# Patient Record
Sex: Female | Born: 1970 | Marital: Single
Health system: Southern US, Community
[De-identification: ages and names within clinical notes are randomized; demographics above are authoritative.]

## PROBLEM LIST (undated history)

## (undated) DIAGNOSIS — F32A Depression, unspecified: Secondary | ICD-10-CM

## (undated) DIAGNOSIS — F419 Anxiety disorder, unspecified: Secondary | ICD-10-CM

## (undated) DIAGNOSIS — I10 Essential (primary) hypertension: Secondary | ICD-10-CM

## (undated) DIAGNOSIS — R000 Tachycardia, unspecified: Secondary | ICD-10-CM

## (undated) DIAGNOSIS — F0390 Unspecified dementia without behavioral disturbance: Secondary | ICD-10-CM

## (undated) DIAGNOSIS — F329 Major depressive disorder, single episode, unspecified: Secondary | ICD-10-CM

## (undated) DIAGNOSIS — F1020 Alcohol dependence, uncomplicated: Secondary | ICD-10-CM

---

## 2017-11-04 ENCOUNTER — Other Ambulatory Visit: Payer: Self-pay

## 2017-11-04 ENCOUNTER — Encounter: Payer: Self-pay | Admitting: Emergency Medicine

## 2017-11-04 ENCOUNTER — Emergency Department
Admission: EM | Admit: 2017-11-04 | Discharge: 2017-11-04 | Disposition: A | Discharge status: Home / Self Care | Payer: Medicaid Other | Attending: Emergency Medicine | Admitting: Emergency Medicine

## 2017-11-04 DIAGNOSIS — F329 Major depressive disorder, single episode, unspecified: Secondary | ICD-10-CM | POA: Diagnosis not present

## 2017-11-04 DIAGNOSIS — F419 Anxiety disorder, unspecified: Secondary | ICD-10-CM | POA: Diagnosis not present

## 2017-11-04 DIAGNOSIS — F1027 Alcohol dependence with alcohol-induced persisting dementia: Secondary | ICD-10-CM

## 2017-11-04 DIAGNOSIS — R4689 Other symptoms and signs involving appearance and behavior: Secondary | ICD-10-CM

## 2017-11-04 DIAGNOSIS — R456 Violent behavior: Secondary | ICD-10-CM | POA: Diagnosis present

## 2017-11-04 DIAGNOSIS — R451 Restlessness and agitation: Secondary | ICD-10-CM

## 2017-11-04 DIAGNOSIS — I10 Essential (primary) hypertension: Secondary | ICD-10-CM | POA: Insufficient documentation

## 2017-11-04 HISTORY — DX: Essential (primary) hypertension: I10

## 2017-11-04 HISTORY — DX: Anxiety disorder, unspecified: F41.9

## 2017-11-04 HISTORY — DX: Alcohol dependence, uncomplicated: F10.20

## 2017-11-04 HISTORY — DX: Tachycardia, unspecified: R00.0

## 2017-11-04 LAB — COMPREHENSIVE METABOLIC PANEL
ALK PHOS: 65 U/L (ref 38–126)
ALT: 14 U/L (ref 0–44)
AST: 18 U/L (ref 15–41)
Albumin: 4.4 g/dL (ref 3.5–5.0)
Anion gap: 7 (ref 5–15)
BILIRUBIN TOTAL: 0.5 mg/dL (ref 0.3–1.2)
BUN: 8 mg/dL (ref 6–20)
CHLORIDE: 110 mmol/L (ref 98–111)
CO2: 22 mmol/L (ref 22–32)
CREATININE: 0.9 mg/dL (ref 0.44–1.00)
Calcium: 9.1 mg/dL (ref 8.9–10.3)
GFR calc Af Amer: >60 mL/min (ref >60)
Glucose, Bld: 98 mg/dL (ref 70–99)
Potassium: 3.8 mmol/L (ref 3.5–5.1)
Sodium: 139 mmol/L (ref 135–145)
TOTAL PROTEIN: 7.1 g/dL (ref 6.5–8.1)

## 2017-11-04 LAB — URINE DRUG SCREEN, QUALITATIVE (ARMC ONLY)
Amphetamines, Ur Screen: NOT DETECTED
BARBITURATES, UR SCREEN: NOT DETECTED
BENZODIAZEPINE, UR SCRN: NOT DETECTED
Cannabinoid 50 Ng, Ur ~~LOC~~: NOT DETECTED
Cocaine Metabolite,Ur ~~LOC~~: NOT DETECTED
MDMA (Ecstasy)Ur Screen: NOT DETECTED
METHADONE SCREEN, URINE: NOT DETECTED
OPIATE, UR SCREEN: NOT DETECTED
Phencyclidine (PCP) Ur S: NOT DETECTED
Tricyclic, Ur Screen: NOT DETECTED

## 2017-11-04 LAB — ETHANOL

## 2017-11-04 LAB — CBC
HEMATOCRIT: 38.2 % (ref 35.0–47.0)
Hemoglobin: 13.1 g/dL (ref 12.0–16.0)
MCH: 29 pg (ref 26.0–34.0)
MCHC: 34.3 g/dL (ref 32.0–36.0)
MCV: 84.5 fL (ref 80.0–100.0)
Platelets: 319 10*3/uL (ref 150–440)
RBC: 4.52 MIL/uL (ref 3.80–5.20)
RDW: 12.8 % (ref 11.5–14.5)
WBC: 5.2 10*3/uL (ref 3.6–11.0)

## 2017-11-04 MED ORDER — QUETIAPINE FUMARATE 25 MG PO TABS
100.0000 mg | ORAL_TABLET | Freq: Three times a day (TID) | ORAL | Status: DC
  Filled 2017-11-04: qty 4

## 2017-11-04 NOTE — Discharge Instructions (Addendum)
You have been seen in the Emergency Department (ED)  today for a psychiatric complaint.  You have been evaluated by psychiatry and we believe you are safe to be discharged from the hospital.   ° °Please return to the Emergency Department (ED)  immediately if you have ANY thoughts of hurting yourself or anyone else, so that we may help you. ° °Please avoid alcohol and drug use. ° °Follow up with your doctor and/or therapist as soon as possible regarding today's ED  visit.  ° °You may call crisis hotline for [place] at [phone]. ° °

## 2017-11-04 NOTE — ED Provider Notes (Signed)
[facility] Emergency Department Provider Note  ____________________________________________  Time seen: Approximately 3:16 PM  I have reviewed the triage vital signs and the nursing notes.   HISTORY  Chief Complaint Psychiatric Evaluation  Level 5 caveat:  Portions of the history and physical were unable to be obtained due to dementia   HPI Joanne Johnston is a 47 y.o. female history of Wernicke Korsakoff encephalopathy and bipolar disorder who presents for evaluation for aggressive behavior. Patient was hospitalized at [facility] for 4 months and finally placed at a memory unit locally.  Today patient had an argument at the facility and was sent here for evaluation.  Patient is tearful and very confused.  She reports that she has been having memory issues for several months.  She does not really remember what her diagnosis are.  She denies SI or HI.   Past Medical History:  Diagnosis Date  . Alcohol dependence (HCC)   . Anxiety   . Hypertension   . Tachycardia     Patient Active Problem List   Diagnosis Date Noted  . 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 Agitation 11/04/2017    Past Surgical History:  Procedure Laterality Date  . CESAREAN SECTION     x2    Prior to Admission medications   Not on File    Allergies Patient has no known allergies.   Social History Social History   Tobacco Use  . Smoking status: Never Smoker  . Smokeless tobacco: Never Used  Substance Use Topics  . Alcohol use: Not Currently    Frequency: Never    Comment: states hasn't had a drink in months  . Drug use: Never    Review of Systems  Constitutional: Negative for fever. Eyes: Negative for visual changes. ENT: Negative for sore throat. Neck: No neck pain  Cardiovascular: Negative for chest pain. Respiratory: Negative for shortness of breath. Gastrointestinal: Negative for abdominal pain, vomiting or diarrhea. Genitourinary:  Negative for dysuria. Musculoskeletal: Negative for back pain. Skin: Negative for rash. Neurological: Negative for headaches, weakness or numbness. Psych: No SI or HI. + confusion  ____________________________________________   PHYSICAL EXAM:  VITAL SIGNS: ED Triage Vitals  Enc Vitals Group     BP 11/04/17 1338 (!) 140/96     Pulse Rate 11/04/17 1338 85     Resp 11/04/17 1338 20     Temp 11/04/17 1338 98.8 F (37.1 C)     Temp Source 11/04/17 1338 Oral     SpO2 11/04/17 1338 100 %     Weight 11/04/17 1338 165 lb (74.8 kg)     Height 11/04/17 1338 5\' 8"  (1.727 m)     Head Circumference --      Peak Flow --      Pain Score 11/04/17 1346 0     Pain Loc --      Pain Edu? --      Excl. in GC? --     Constitutional: Alert and oriented. Well appearing and in no apparent distress. HEENT:      Head: Normocephalic and atraumatic.         Eyes: Conjunctivae are normal. Sclera is non-icteric.       Mouth/Throat: Mucous membranes are moist.       Neck: Supple with no signs of meningismus. Cardiovascular: Regular rate and rhythm. No murmurs, gallops, or rubs. 2+ symmetrical distal pulses are present in all extremities. No JVD. Respiratory: Normal respiratory effort. Lungs are clear to auscultation  bilaterally. No wheezes, crackles, or rhonchi.  Gastrointestinal: Soft, non tender, and non distended with positive bowel sounds. No rebound or guarding. Genitourinary: No CVA tenderness. Musculoskeletal: Nontender with normal range of motion in all extremities. No edema, cyanosis, or erythema of extremities. Neurologic: Normal speech and language. Face is symmetric. Moving all extremities. No gross focal neurologic deficits are appreciated. Skin: Skin is warm, dry and intact. No rash noted. Psychiatric: Mood and affect are depressed. Speech and behavior are normal.  ____________________________________________   LABS (all labs ordered are listed, but only abnormal results are  displayed)  Labs Reviewed  COMPREHENSIVE METABOLIC PANEL  ETHANOL  CBC  URINE DRUG SCREEN, QUALITATIVE ([facility] ONLY)   ____________________________________________  EKG  none  ____________________________________________  RADIOLOGY  none  ____________________________________________   PROCEDURES  Procedure(s) performed: None Procedures Critical Care performed:  None ____________________________________________   INITIAL IMPRESSION / ASSESSMENT AND PLAN / ED COURSE  47 y.o. female history of Wernicke Korsakoff encephalopathy and bipolar disorder who presents for evaluation for aggressive behavior.  Patient diagnosed 4 months ago with advanced dementia due to alcohol, currently living in a memory unit.  She is brought in today for aggressive behavior.  Patient is polite and tearful, denies any suicidal homicidal ideation.  At this time no indication for involuntary commitment.  Will discuss with our psychiatrist for evaluation in the emergency room.    _________________________ 5:50 PM on 11/04/2017 -----------------------------------------  Patient cleared by psychiatry to be discharged back to her memory care home.   As part of my medical decision making, I reviewed the following data within the electronic MEDICAL RECORD NUMBER Nursing notes reviewed and incorporated, Labs reviewed , Old chart reviewed, A consult was requested and obtained from this/these consultant(s) Psychiatry, Notes from prior ED visits and [place] Controlled Substance Database    Pertinent labs & imaging results that were available during my care of the patient were reviewed by me and considered in my medical decision making (see chart for details).    ____________________________________________   FINAL CLINICAL IMPRESSION(S) / ED DIAGNOSES  Final diagnoses:  Aggressive behavior      NEW MEDICATIONS STARTED DURING THIS VISIT:  ED Discharge Orders    None       Note:  This document was  prepared using Dragon voice recognition software and may include unintentional dictation errors.    Don Perking, [place], MD 11/04/17 [phone]

## 2017-11-04 NOTE — ED Notes (Signed)
Pt given meal tray.

## 2017-11-04 NOTE — ED Notes (Signed)
Called and gave report to Illene Silver LPN and informed her of patients plan for discharge.

## 2017-11-04 NOTE — ED Notes (Signed)
Left message for legal guardian [phone] to call ED back.

## 2017-11-04 NOTE — ED Notes (Addendum)
FIRST NURSE NOTE: Pt arrived via [facility] from [facility] with reports of being combative and barricading herself in her room.  Pt cooperative with EMS.  Pt lives in the memory care center at [facility].  EMS states they are unsure if she is taking her medications. Pt has hx of alcohol dependence.  Pt has a legal Azzie Almas [phone]

## 2017-11-04 NOTE — ED Triage Notes (Signed)
See first nurse note. Patient reports that she was sent here for being combative at group home. Patient reports history of psychiatric issues but denies seeing psychiatrist or taking medications currently. Patient denies SI/HI. Calm and cooperative in triage.

## 2017-11-04 NOTE — ED Notes (Signed)
Pt given snacks.

## 2017-11-04 NOTE — Consult Note (Signed)
[place] Psychiatry Consult   Reason for Consult: Consult for this 47 year old woman brought from the [facility] Referring Physician: Alfred Levins Patient Identification: Joanne Johnston MRN:  [id number] Principal Diagnosis: Alcohol-induced persisting dementia [facility]) Diagnosis:   Patient Active Problem List   Diagnosis Date Noted  . Alcohol-induced persisting dementia ([place]) [F10.27] 11/04/2017  . Agitation [R45.1] 11/04/2017    Total Time spent with patient: 1 hour  Subjective:   Joanne Johnston is a 47 y.o. female patient admitted with "I was brought from some facility".  HPI: Patient interviewed chart reviewed.  47 year old woman brought from the [facility] with reports that she had been agitated and combative.  Since being in the emergency room the patient has been calm and has not been combative although at times she does seem emotionally labile.  Patient carries a diagnosis of alcohol-induced persisting dementia.  On interview she actually is able to "that diagnosis and is aware that that is her problem.  She does not remember details about specifically why she is here but she knows she was brought here from the [facility] and suspects that it was because she was combative.  Cannot remember any particular reason for being combative.  Currently not reporting any hallucinations.  Does not appear to be paranoid.  Patient says that her mood is bad and upset and she relates this to being aware of her diagnosis and confused about her current situation.  Denies suicidal or homicidal thoughts.  It appears that her only medications have been vitamins and low dose Seroquel recently.  If I am reading the notes correctly she has only been at the [facility] for about 6 days.  Social history: Patient reports that she is originally from [place] or at least most recently from [place].  She is correct that she was hospitalized at new [facility] most recently it appears in April of this  year.  She mentions some family and a boyfriend.  She admits that she has not spoken to anyone that she knows and she does not know how long.  Patient is listed as having a legal guardian although she did not appear aware of it when we were talking with her.  Medical history: Patient says she has high blood pressure.  Substance abuse history: Patient admits to a history of alcohol abuse.  She is a little vague about it and minimizes it but ultimately admits that she was drinking up to a bottle of wine every day.  No known other drug abuse.  Past Psychiatric History: Patient was seen by the psychiatric service at new [place] when she was hospitalized there.  Only diagnosis is the alcohol induced persisting dementia.  Risk to Self:   Risk to Others:   Prior Inpatient Therapy:   Prior Outpatient Therapy:    Past Medical History:  Past Medical History:  Diagnosis Date  . Alcohol dependence ([place])   . Anxiety   . Hypertension   . Tachycardia     Past Surgical History:  Procedure Laterality Date  . CESAREAN SECTION     x2   Family History: No family history on file. Family Psychiatric  History: Says that high blood pressure does run in her family. Social History:  Social History   Substance and Sexual Activity  Alcohol Use Not Currently  . Frequency: Never   Comment: states hasn't had a drink in months     Social History   Substance and Sexual Activity  Drug Use Never  Social History   Socioeconomic History  . Marital status: Divorced    Spouse name: Not on file  . Number of children: Not on file  . Years of education: Not on file  . Highest education level: Not on file  Occupational History  . Not on file  Social Needs  . Financial resource strain: Not on file  . Food insecurity:    Worry: Not on file    Inability: Not on file  . Transportation needs:    Medical: Not on file    Non-medical: Not on file  Tobacco Use  . Smoking status: Never Smoker  . Smokeless  tobacco: Never Used  Substance and Sexual Activity  . Alcohol use: Not Currently    Frequency: Never    Comment: states hasn't had a drink in months  . Drug use: Never  . Sexual activity: Not on file  Lifestyle  . Physical activity:    Days per week: Not on file    Minutes per session: Not on file  . Stress: Not on file  Relationships  . Social connections:    Talks on phone: Not on file    Gets together: Not on file    Attends religious service: Not on file    Active member of club or organization: Not on file    Attends meetings of clubs or organizations: Not on file    Relationship status: Not on file  Other Topics Concern  . Not on file  Social History Narrative  . Not on file   Additional Social History:    Allergies:  No Known Allergies  Labs:  Results for orders placed or performed during the hospital encounter of 11/04/17 (from the past 48 hour(s))  Comprehensive metabolic panel     Status: None   Collection Time: 11/04/17  1:54 PM  Result Value Ref Range   Sodium 139 135 - 145 mmol/L   Potassium 3.8 3.5 - 5.1 mmol/L   Chloride 110 98 - 111 mmol/L   CO2 22 22 - 32 mmol/L   Glucose, Bld 98 70 - 99 mg/dL   BUN 8 6 - 20 mg/dL   Creatinine, Ser 0.90 0.44 - 1.00 mg/dL   Calcium 9.1 8.9 - 10.3 mg/dL   Total Protein 7.1 6.5 - 8.1 g/dL   Albumin 4.4 3.5 - 5.0 g/dL   AST 18 15 - 41 U/L   ALT 14 0 - 44 U/L   Alkaline Phosphatase 65 38 - 126 U/L   Total Bilirubin 0.5 0.3 - 1.2 mg/dL   GFR calc non Af Amer >60 >60 mL/min   GFR calc Af Amer >60 >60 mL/min    Comment: (NOTE) The eGFR has been calculated using the CKD EPI equation. This calculation has not been validated in all clinical situations. eGFR's persistently <60 mL/min signify possible Chronic Kidney Disease.    Anion gap 7 5 - 15    Comment: Performed at [facility], [address] [place], [place] [address]  Ethanol     Status: None   Collection Time: 11/04/17  1:54 PM  Result Value Ref  Range   Alcohol, Ethyl (B) <10 <10 mg/dL    Comment: (NOTE) Lowest detectable limit for serum alcohol is 10 mg/dL. For medical purposes only. Performed at [facility], [address] [place], [place] [address]   cbc     Status: None   Collection Time: 11/04/17  1:54 PM  Result Value Ref Range  WBC 5.2 3.6 - 11.0 K/uL   RBC 4.52 3.80 - 5.20 MIL/uL   Hemoglobin 13.1 12.0 - 16.0 g/dL   HCT 38.2 35.0 - 47.0 %   MCV 84.5 80.0 - 100.0 fL   MCH 29.0 26.0 - 34.0 pg   MCHC 34.3 32.0 - 36.0 g/dL   RDW 12.8 11.5 - 14.5 %   Platelets 319 150 - 440 K/uL    Comment: Performed at [facility], [address], [place], [place] [address]  Urine Drug Screen, Qualitative     Status: None   Collection Time: 11/04/17  1:57 PM  Result Value Ref Range   Tricyclic, Ur Screen NONE DETECTED NONE DETECTED   Amphetamines, Ur Screen NONE DETECTED NONE DETECTED   MDMA (Ecstasy)Ur Screen NONE DETECTED NONE DETECTED   Cocaine Metabolite,Ur [place] NONE DETECTED NONE DETECTED   Opiate, Ur Screen NONE DETECTED NONE DETECTED   Phencyclidine (PCP) Ur S NONE DETECTED NONE DETECTED   Cannabinoid 50 Ng, Ur [place] NONE DETECTED NONE DETECTED   Barbiturates, Ur Screen NONE DETECTED NONE DETECTED   Benzodiazepine, Ur Scrn NONE DETECTED NONE DETECTED   Methadone Scn, Ur NONE DETECTED NONE DETECTED    Comment: (NOTE) Tricyclics + metabolites, urine    Cutoff 1000 ng/mL Amphetamines + metabolites, urine  Cutoff 1000 ng/mL MDMA (Ecstasy), urine              Cutoff 500 ng/mL Cocaine Metabolite, urine          Cutoff 300 ng/mL Opiate + metabolites, urine        Cutoff 300 ng/mL Phencyclidine (PCP), urine         Cutoff 25 ng/mL Cannabinoid, urine                 Cutoff 50 ng/mL Barbiturates + metabolites, urine  Cutoff 200 ng/mL Benzodiazepine, urine              Cutoff 200 ng/mL Methadone, urine                   Cutoff 300 ng/mL The urine drug screen provides only a preliminary, unconfirmed analytical  test result and should not be used for non-medical purposes. Clinical consideration and professional judgment should be applied to any positive drug screen result due to possible interfering substances. A more specific alternate chemical method must be used in order to obtain a confirmed analytical result. Gas chromatography / mass spectrometry (GC/MS) is the preferred confirmat ory method. Performed at [facility], [address] [place], [place] [address]     Current Facility-Administered Medications  Medication Dose Route Frequency Provider Last Rate Last Dose  . QUEtiapine (SEROQUEL) tablet 100 mg  100 mg Oral TID Nikkole Placzek, Madie [place], MD       No current outpatient medications on file.    Musculoskeletal: Strength & Muscle Tone: within normal limits Gait & Station: normal Patient leans: N/A  Psychiatric Specialty Exam: Physical Exam  Nursing note and vitals reviewed. Constitutional: She appears well-developed and well-nourished.  HENT:  Head: Normocephalic and atraumatic.  Eyes: Pupils are equal, round, and reactive to light. Conjunctivae are normal.  Neck: Normal range of motion.  Cardiovascular: Regular rhythm and normal heart sounds.  Respiratory: Effort normal. No respiratory distress.  GI: Soft.  Musculoskeletal: Normal range of motion.  Neurological: She is alert.  Skin: Skin is warm and dry.  Psychiatric: Her affect is blunt. Her speech is delayed. She is slowed. Thought content is not paranoid.  Cognition and memory are impaired. She expresses impulsivity and inappropriate judgment. She expresses no homicidal and no suicidal ideation. She exhibits abnormal recent memory and abnormal remote memory.    ROS  Blood pressure (!) 140/96, pulse 85, temperature 98.8 F (37.1 C), temperature source Oral, resp. rate 20, height '5\' 8"'  (1.727 m), weight 74.8 kg, SpO2 100 %.Body mass index is 25.09 kg/m.  General Appearance: Casual  Eye Contact:  Minimal  Speech:   Slow  Volume:  Decreased  Mood:  Dysphoric  Affect:  Blunt and Tearful  Thought Process:  Coherent  Orientation:  Other:  Patient was able to name the town although she said she was in [place].  Could not remember much about other details but had a vague idea of the current circumstances.  Thought Content:  Logical  Suicidal Thoughts:  No  Homicidal Thoughts:  No  Memory:  Immediate;   Fair Recent;   Poor Remote;   Poor  Judgement:  Poor  Insight:  Shallow  Psychomotor Activity:  Normal  Concentration:  Concentration: Poor  Recall:  Poor  Fund of Knowledge:  Poor  Language:  Fair  Akathisia:  No  Handed:  Right  AIMS (if indicated):     Assets:  Desire for Improvement Physical Health  ADL's:  Impaired  Cognition:  Impaired,  Mild  Sleep:        Treatment Plan Summary: Daily contact with patient to assess and evaluate symptoms and progress in treatment, Medication management and Plan This is a 47 year old woman who has alcohol induced persisting dementia.  There is no evidence that she has a psychotic disorder or other mood disorder.  Patient does not require psychiatric hospitalization.  She has some insight into the fact that she gets irritable and combative and justifies that by the unpleasantness of her situation.  Patient has been completely calm since being here in the emergency room.  I think she should go back to the [facility] for continued management.  I would recommend that we increase the dose of her Seroquel above what they are currently prescribing up to 100 mg 2-3 times a day.  I would be happy to provide a prescription on return.  Patient is not under IVC.  Labs reviewed.  Case reviewed with emergency room physician and TTS.  Disposition: No evidence of imminent risk to self or others at present.   Patient does not meet criteria for psychiatric inpatient admission. Supportive therapy provided about ongoing stressors.  Alethia Berthold, MD 11/04/2017 5:01 PM

## 2017-11-04 NOTE — ED Notes (Signed)
Upon explaining to patient that she would need be dressed out and her belongings secured, Patient became agitated and started yelling at this RN and [facility], EDT. Patient states "I'm just a prisoner. I'm not a person any more. Y'all don't care a thing about me." This RN tried to reason with patient and reassure her that we had these guidelines for her safety. Patient continuing to mumble under her breath and states "I"m not doing it. You aren't taking my stuff." This RN spoke with Dr. Cyril Loosen, who was agreeable to have patient stay in street clothes with her belongings and be evaluated by MD prior to being made to dress out. Patient moved to [facility] with belongings.

## 2017-11-04 NOTE — ED Notes (Signed)
Called Joanne Johnston [phone] legal guardian of patient to inform of her patients discharge. Left voicemail.

## 2017-11-04 NOTE — ED Notes (Signed)
Per crew chief from [facility] a truck will come to pick patient up and transport to [facility]

## 2017-11-05 ENCOUNTER — Emergency Department
Admission: EM | Admit: 2017-11-05 | Discharge: 2017-11-06 | Disposition: A | Discharge status: Home / Self Care | Payer: Medicaid Other | Attending: Student in an Organized Health Care Education/Training Program | Admitting: Student in an Organized Health Care Education/Training Program

## 2017-11-05 DIAGNOSIS — R451 Restlessness and agitation: Secondary | ICD-10-CM | POA: Diagnosis not present

## 2017-11-05 DIAGNOSIS — I10 Essential (primary) hypertension: Secondary | ICD-10-CM | POA: Diagnosis not present

## 2017-11-05 DIAGNOSIS — F1027 Alcohol dependence with alcohol-induced persisting dementia: Secondary | ICD-10-CM

## 2017-11-05 DIAGNOSIS — F319 Bipolar disorder, unspecified: Secondary | ICD-10-CM | POA: Insufficient documentation

## 2017-11-05 MED ORDER — QUETIAPINE FUMARATE 25 MG PO TABS
100.0000 mg | ORAL_TABLET | Freq: Three times a day (TID) | ORAL | Status: DC
  Administered 2017-11-05 – 2017-11-06 (×3): 100 mg via ORAL
  Filled 2017-11-05 (×3): qty 4

## 2017-11-05 NOTE — ED Notes (Signed)
VOL/  PENDING  CONSULT 

## 2017-11-05 NOTE — ED Notes (Signed)
[facility] @ [phone] no answer.

## 2017-11-05 NOTE — ED Notes (Signed)
Pt becoming aggravated and continues to ask why she is here and keeps yelling out, "I'm being treated like a fucking prisoner."

## 2017-11-05 NOTE — ED Notes (Signed)
[facility] called and this Clinical research associate spoke with [facility] stated she would call her Unit manager or DON and return call to this Clinical research associate.  Raven stated "I was told patient would be admitted to your facility"  Raven told pt. Did not meet criteria for admission and we would need to hear back from her facility.  Raven stated the # we have for facility is correct.

## 2017-11-05 NOTE — ED Notes (Signed)
Patient states she does not know why she is here. States she has memory problem related to alcohol dementia. Will call [facility] to get information as of why patient was sent to hospital. EMS staff only can state that patient has memory problems and that is why they sent her.

## 2017-11-05 NOTE — ED Notes (Signed)
[facility] called stated they would not be able to pick up patient till morning.  Stated if we could call at 9 am the would arrange pick up of patient.

## 2017-11-05 NOTE — ED Provider Notes (Signed)
[facility] Emergency Department Provider Note    First MD Initiated Contact with Patient 11/05/17 1538     (approximate)  I have reviewed the triage vital signs and the nursing notes.   HISTORY  Chief Complaint No chief complaint on file.    HPI Joanne Johnston is a 47 y.o. female a history of Warnicke Korsakoff as well as bipolar disorder presents to the ER from [facility].  No report as to why the patient was brought to the ER.  Patient is amnestic to today's events.  Denies any pain.  No headache.  No numbness or tingling.  No nausea or vomiting.  No chest pain.  She is calm cooperative and pleasant in the ER.  Denies any thoughts of harming herself or any feelings of anger.   I spoke with the legal guardian, Ms. Farrior, regarding the presentation.  According to staff at the [facility] she is barricading herself in her room and refusing to take any medications.  Patient was seen by psychiatry yesterday with instructions to increase her Seroquel to 100 mg to 3 times daily.  They did not start this.  Patient refusing her medications.  According to staff there also were due to her frequent outbursts and agitation.  Past Medical History:  Diagnosis Date  . Alcohol dependence (HCC)   . Anxiety   . Hypertension   . Tachycardia    No family history on file. Past Surgical History:  Procedure Laterality Date  . CESAREAN SECTION     x2   Patient Active Problem List   Diagnosis Date Noted  . 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 Agitation 11/04/2017      Prior to Admission medications   Not on File    Allergies Patient has no known allergies.    Social History Social History   Tobacco Use  . Smoking status: Never Smoker  . Smokeless tobacco: Never Used  Substance Use Topics  . Alcohol use: Not Currently    Frequency: Never    Comment: states hasn't had a drink in months  . Drug use: Never    Review of  Systems Patient denies headaches, rhinorrhea, blurry vision, numbness, shortness of breath, chest pain, edema, cough, abdominal pain, nausea, vomiting, diarrhea, dysuria, fevers, rashes or hallucinations unless otherwise stated above in HPI. ____________________________________________   PHYSICAL EXAM:  VITAL SIGNS: Vitals:   11/05/17 1550 11/05/17 1650  BP: (!) 121/104 127/85  Pulse: 74 90  Resp: 20 20  Temp: 98.5 F (36.9 C)   SpO2: 100% 100%    Constitutional: Alert and pleasant, disoriented x 2  Eyes: Conjunctivae are normal.  Head: Atraumatic. Nose: No congestion/rhinnorhea. Mouth/Throat: Mucous membranes are moist.   Neck: No stridor. Painless ROM.  Cardiovascular: Normal rate, regular rhythm. Grossly normal heart sounds.  Good peripheral circulation. Respiratory: Normal respiratory effort.  No retractions. Lungs CTAB. Gastrointestinal: Soft and nontender. No distention. No abdominal bruits. No CVA tenderness. Genitourinary:  Musculoskeletal: No lower extremity tenderness nor edema.  No joint effusions. Neurologic:  Normal speech and language. No gross focal neurologic deficits are appreciated. No facial droop Skin:  Skin is warm, dry and intact. No rash noted. Psychiatric: Mood and affect are normal. Speech and behavior are normal.  ____________________________________________   LABS (all labs ordered are listed, but only abnormal results are displayed)  No results found for this or any previous visit (from the past 24 hour(s)). ____________________________________________ ____________________________________________  RADIOLOGY   ____________________________________________  PROCEDURES  Procedure(s) performed:  Procedures    Critical Care performed: no ____________________________________________   INITIAL IMPRESSION / ASSESSMENT AND PLAN / ED COURSE  Pertinent labs & imaging results that were available during my care of the patient were reviewed  by me and considered in my medical decision making (see chart for details).   DDX: Agitation, working Korsakoff, well check  Vilma Will is a 47 y.o. who presents to the ED with recurrent episodes of agitation at her facility.  She is currently pleasant and appropriate.  No evidence of agitation.  Will re-dose her prescribed Seroquel.  Will speak with psychiatry.  Clinical Course as of Nov 05 2240  Wed Nov 05, 2017  1843 She is tolerating her oral medication.  She is been calm and cooperative here in the ER.  Not felt to meet any requirement for inpatient hospitalization.  Likely some difficulty adjusting to new living situation.  Stable and appropriate for discharge home.   [PR]    Clinical Course User Index [PR] Joanne Eddy, MD     As part of my medical decision making, I reviewed the following data within the electronic MEDICAL RECORD NUMBER Nursing notes reviewed and incorporated, Labs reviewed, notes from prior ED visits.   __________________________________________ FINAL CLINICAL IMPRESSION(S) / ED DIAGNOSES  Final diagnoses:  Agitation      NEW MEDICATIONS STARTED DURING THIS VISIT:  New Prescriptions   No medications on file     Note:  This document was prepared using Dragon voice recognition software and may include unintentional dictation errors.    Joanne Eddy, MD 11/05/17 2242

## 2017-11-05 NOTE — ED Notes (Signed)
[facility] called at [phone] no one answered will try again later.

## 2017-11-05 NOTE — ED Notes (Signed)
[facility] @ [phone] no answer.
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
## 2017-11-05 NOTE — ED Notes (Signed)
Spoke with patient.  Pt. Asked "Why am I being treated like a prisoner".  Pt. Told that the facility from which she came from wanted her to be evaluated.  Pt. Asked "Why do you think you were sent here".  Pt. States "because I was aggressive".

## 2017-11-05 NOTE — ED Notes (Signed)
[place] called to send an officer to [facility] to pick up patient

## 2017-11-05 NOTE — ED Triage Notes (Signed)
Spoke with Lupita Leash Surveyor, minerals) on phone. She reports patient has been having behavior issues. Reports patient will close her door and move dresser infront of door where staff cannot get in and yell out things like "rape" Also reports pt is lunging at people and throwing items at other people and staff. Lupita Leash reports "One minute she is acting out and the next acting perfectly normal. When evaluated by the Doctor at their facility and social worker they decided she should be sent to ER and be admitted. Also reports doctor spoke with behavior unit here and they suggested patient be sent here to be admitted to behavior unit.

## 2017-11-05 NOTE — Discharge Instructions (Signed)
This is a 47 year old woman who has alcohol induced persisting dementia.  There is no evidence that she has a psychotic disorder or other mood disorder.  Patient does not require psychiatric hospitalization.  She has some insight into the fact that she gets irritable and combative and justifies that by the unpleasantness of her situation.  Patient has been completely calm since being here in the emergency room.  I think she should go back to the [facility] for continued management.  I would recommend that we increase the dose of her Seroquel above what they are currently prescribing up to 100 mg 2-3 times a day.  I would be happy to provide a prescription on return.  Patient is not under IVC.  Labs reviewed.  Case reviewed with emergency room physician and TTS.   Disposition: No evidence of imminent risk to self or others at present.   Patient does not meet criteria for psychiatric inpatient admission. Supportive therapy provided about ongoing stressors.

## 2017-11-06 NOTE — ED Notes (Signed)
RN left voicemail for patient's legal guardian stating the patient will be discharged back to the [facility].  RN left call back number if guardian had any concerns.

## 2017-11-06 NOTE — ED Notes (Signed)
Patient in hallway yelling, "Where are my things?  So you just take my things are lock me in here?"  I explained to patient that she would discharge after 9am.  Patient receptive.  Patient calm, cooperative, and apologetic.

## 2017-11-06 NOTE — ED Provider Notes (Signed)
Vitals:   11/05/17 1550 11/05/17 1650  BP: (!) 121/104 127/85  Pulse: 74 90  Resp: 20 20  Temp: 98.5 F (36.9 C)   SpO2: 100% 100%    No acute events reported to me overnight.  I did review psychiatry note from Dr. Toni Amendlapacs yesterday who is recommending no inpatient psychiatric medical need, recommends [facility].  Behavioral med/social work working on Restaurant manager, fast fooddischarge/disposition.   Governor RooksLord, Ekin Pilar, MD 11/06/17 [phone]

## 2017-11-06 NOTE — Clinical Social Work Note (Addendum)
CSW was informed that [facility] is from [facility] [place], CSW attempted to contact admissions Aldine ContesCarolyn Markham, [phone], left message on voice mail awaiting for call back.  CSW spoke to N[place]arolyn at [facility] [place], and she said to send patient back via EMS, due to their transportation team being out of the office.  CSW was also informed they do not need a new FL2 since patient was not admitted to hospital, and to just include any new medication prescriptions.  She said to call report to room 612, 600 hall nurse [phone].  CSW updated bedside nurse, and also left a voice mail on legal guardian's voice mail Effie [place]ecia [place]rior [place], [phone].    Ervin KnackEric R. Alexya Mcdaris, MSW, Theresia MajorsLCSWA [phone]  11/06/2017 10:29 AM

## 2017-11-06 NOTE — ED Notes (Signed)
Pt discharged via [facility].  VS stable.  Pt denies SI/HI and AVH. Discharge instructions given to transport. Report called to 600 hall RN.  Voicemail left for patient's legal guardian to notify her of patient's discharge.

## 2017-11-06 NOTE — ED Notes (Signed)
Report called to 600 hall nurse at [facility] @ 1110.

## 2017-11-11 ENCOUNTER — Emergency Department
Admission: EM | Admit: 2017-11-11 | Discharge: 2017-11-14 | Disposition: A | Discharge status: Other Acute Inpatient Hospital | Payer: Medicaid Other | Attending: Emergency Medicine | Admitting: Emergency Medicine

## 2017-11-11 ENCOUNTER — Other Ambulatory Visit: Payer: Self-pay

## 2017-11-11 DIAGNOSIS — R451 Restlessness and agitation: Secondary | ICD-10-CM | POA: Diagnosis not present

## 2017-11-11 DIAGNOSIS — R45851 Suicidal ideations: Secondary | ICD-10-CM | POA: Insufficient documentation

## 2017-11-11 DIAGNOSIS — F1027 Alcohol dependence with alcohol-induced persisting dementia: Secondary | ICD-10-CM | POA: Diagnosis not present

## 2017-11-11 DIAGNOSIS — I10 Essential (primary) hypertension: Secondary | ICD-10-CM | POA: Diagnosis not present

## 2017-11-11 DIAGNOSIS — F99 Mental disorder, not otherwise specified: Secondary | ICD-10-CM | POA: Insufficient documentation

## 2017-11-11 DIAGNOSIS — R4689 Other symptoms and signs involving appearance and behavior: Secondary | ICD-10-CM | POA: Diagnosis present

## 2017-11-11 LAB — BASIC METABOLIC PANEL
Anion gap: 8 (ref 5–15)
BUN: 9 mg/dL (ref 6–20)
CHLORIDE: 107 mmol/L (ref 98–111)
CO2: 24 mmol/L (ref 22–32)
Calcium: 9.1 mg/dL (ref 8.9–10.3)
Creatinine, Ser: 0.91 mg/dL (ref 0.44–1.00)
GFR calc Af Amer: >60 mL/min (ref >60)
GLUCOSE: 96 mg/dL (ref 70–99)
Potassium: 4 mmol/L (ref 3.5–5.1)
SODIUM: 139 mmol/L (ref 135–145)

## 2017-11-11 LAB — CBC
HEMATOCRIT: 39.8 % (ref 35.0–47.0)
HEMOGLOBIN: 13.6 g/dL (ref 12.0–16.0)
MCH: 28.4 pg (ref 26.0–34.0)
MCHC: 34.1 g/dL (ref 32.0–36.0)
MCV: 83.3 fL (ref 80.0–100.0)
Platelets: 303 10*3/uL (ref 150–440)
RBC: 4.78 MIL/uL (ref 3.80–5.20)
RDW: 13.2 % (ref 11.5–14.5)
WBC: 6.7 10*3/uL (ref 3.6–11.0)

## 2017-11-11 LAB — ACETAMINOPHEN LEVEL

## 2017-11-11 LAB — SALICYLATE LEVEL: Salicylate Lvl: <7 mg/dL (ref 2.8–30.0)

## 2017-11-11 MED ORDER — LORAZEPAM 2 MG/ML IJ SOLN
2.0000 mg | Freq: Once | INTRAMUSCULAR | Status: AC
  Administered 2017-11-11: 2 mg via INTRAMUSCULAR
  Filled 2017-11-11: qty 1

## 2017-11-11 MED ORDER — QUETIAPINE FUMARATE 100 MG PO TABS
100.0000 mg | ORAL_TABLET | Freq: Three times a day (TID) | ORAL | Status: DC
  Administered 2017-11-12 – 2017-11-13 (×3): 100 mg via ORAL
  Filled 2017-11-11: qty 1
  Filled 2017-11-11: qty 4
  Filled 2017-11-11 (×2): qty 1
  Filled 2017-11-11: qty 4
  Filled 2017-11-11 (×4): qty 1

## 2017-11-11 MED ORDER — ZIPRASIDONE MESYLATE 20 MG IM SOLR
20.0000 mg | Freq: Once | INTRAMUSCULAR | Status: DC
  Filled 2017-11-11: qty 20

## 2017-11-11 MED ORDER — HALOPERIDOL LACTATE 5 MG/ML IJ SOLN
5.0000 mg | Freq: Once | INTRAMUSCULAR | Status: AC
  Administered 2017-11-11: 5 mg via INTRAMUSCULAR
  Filled 2017-11-11: qty 1

## 2017-11-11 NOTE — ED Notes (Signed)
Hourly rounding reveals patient sleeping in room. No complaints, stable, in no acute distress. Q15 minute rounds and monitoring via Security Cameras to continue. 

## 2017-11-11 NOTE — ED Notes (Signed)
Hourly rounding reveals patient in room. No complaints, stable, in no acute distress. Q15 minute rounds and monitoring via Security Cameras to continue. 

## 2017-11-11 NOTE — ED Notes (Signed)
Hourly rounding reveals patient in room. No complaints, stable, in no acute distress. Q15 minute rounds and monitoring via Rover and Officer to continue.   

## 2017-11-11 NOTE — ED Notes (Signed)
Pt yelling and complaining about the "freaking retard across the hall" and being tired of being placed with people like that. Stated she has memory issues and being with people like that makes her mad. Pt slammed the door when this tech walked out.

## 2017-11-11 NOTE — ED Provider Notes (Signed)
[facility] Emergency Department Provider Note   ____________________________________________   First MD Initiated Contact with Patient 11/11/17 1747     (approximate)  I have reviewed the triage vital signs and the nursing notes.   HISTORY  Chief Complaint IVC   HPI Joanne Johnston is a 47 y.o. female presents for evaluation under IVC.  Patient is evidently being aggressive and acting out towards staff at her care setting  Patient herself reports she does not wish to be here, reports that she is upset with where she is living.  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  No fevers or chills.  No headaches.  Denies attempt to hurt herself or anyone else.  Reports she has memory issues due to alcohol abuse    Past Medical History:  Diagnosis Date  . Alcohol dependence (HCC)   . Anxiety   . Hypertension   . Tachycardia     Patient Active Problem List   Diagnosis Date Noted  . 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 Agitation 11/04/2017    Past Surgical History:  Procedure Laterality Date  . CESAREAN SECTION     x2    Prior to Admission medications   Not on File    Allergies Patient has no known allergies.  No family history on file.  Social History Social History   Tobacco Use  . Smoking status: Never Smoker  . Smokeless tobacco: Never Used  Substance Use Topics  . Alcohol use: Not Currently    Frequency: Never    Comment: states hasn't had a drink in months  . Drug use: Never    Review of Systems Constitutional: No fever/chills Eyes: No visual changes. ENT: No sore throat. Cardiovascular: Denies chest pain. Respiratory: Denies shortness of breath. Gastrointestinal: No abdominal pain.     Genitourinary: Negative for dysuria.  Denies pregnancy. Musculoskeletal: Negative for back pain. Skin: Negative for rash. Neurological: Negative for headaches, focal weakness or numbness.    ____________________________________________   PHYSICAL EXAM:  VITAL SIGNS: ED Triage Vitals [11/11/17 1726]  Enc Vitals Group     BP (!) 137/95     Pulse Rate 95     Resp 16     Temp 98 F (36.7 C)     Temp src      SpO2 97 %     Weight 164 lb 14.5 oz (74.8 kg)     Height 5\' 8"  (1.727 m)     Head Circumference      Peak Flow      Pain Score 0     Pain Loc      Pain Edu?      Excl. in GC?     Constitutional: Alert and oriented. Well appearing and in no acute distress though appears agitated, sitting at the side of the bed. Eyes: Conjunctivae are normal. Head: Atraumatic. Nose: No congestion/rhinnorhea. Mouth/Throat: Mucous membranes are moist. Neck: No stridor.   Cardiovascular: Normal rate, regular rhythm. Grossly normal heart sounds.  Good peripheral circulation. Respiratory: Normal respiratory effort.  No retractions. Lungs CTAB. Gastrointestinal: Soft and nontender. No distention. Musculoskeletal: No lower extremity tenderness nor edema. Neurologic:  Normal speech and language. No gross focal neurologic deficits are appreciated.  Skin:  Skin  is warm, dry and intact. No rash noted. Psychiatric: Mood and affect are elevated, somewhat agitated but not violent. Speech and behavior are fast but clear.  ____________________________________________   LABS (all labs ordered are listed, but only abnormal results are displayed)  Labs Reviewed  ACETAMINOPHEN LEVEL - Abnormal; Notable for the following components:      Result Value   Acetaminophen (Tylenol), Serum <10 (*)    All other components within normal limits  CBC  BASIC METABOLIC PANEL  SALICYLATE LEVEL    ____________________________________________  EKG   ____________________________________________  RADIOLOGY   ____________________________________________   PROCEDURES  Procedure(s) performed: None  Procedures  Critical Care performed: No  ____________________________________________   INITIAL IMPRESSION / ASSESSMENT AND PLAN / ED COURSE  Pertinent labs & imaging results that were available during my care of the patient were reviewed by me and considered in my medical decision making (see chart for details).  Patient under IVC.  Presents for evaluation of agitation.  Lab work reassuring without evidence of acute medical etiology.  Known history of psychiatric disease and alcohol induced dementia.  Seen by Dr. Toni Amendlapacs who advises keeping the patient tonight for treatment of her agitation and reevaluation tomorrow.  Patient did begin yelling out cursory, yelling and very agitated and required Haldol and Ativan.  Also seen by Dr. Toni Amendlapacs who advises giving a dose of Geodon as well.  Patient will be monitored overnight, remain under IVC, control of agitation.      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     Sharyn CreamerQuale, Demita Tobia, MD 11/11/17 2008

## 2017-11-11 NOTE — ED Notes (Signed)
Talked with pt who stated she just couldn't remember things. She knows she needs to take her medication and wants to start taking it but sometimes just can't remember. Does not know how long she has [facility] the [facility]. Pt was very nice and smiling during our conversation. Pt has hopes that Dr will let her go back so she is not confined in these four walls because this is not what she needs per pt.

## 2017-11-11 NOTE — ED Notes (Signed)
Pt refusing to dress out. Pt walked to quad by this RN, Ed Radiographer, therapeutictech and Officer. Pt wanded by ODS upon arrival to room. Pt notebook and purse given to [employer] ED[employer].

## 2017-11-11 NOTE — ED Notes (Signed)
IVC PENDING  CONSULT ?

## 2017-11-11 NOTE — ED Triage Notes (Signed)
[facility] wrist forensic cuffs. Pt sent here due to being noncompliant with medications. Pt states she does not know why she is here.

## 2017-11-11 NOTE — ED Notes (Signed)
Report to include Situation, Background, Assessment, and Recommendations received from RN laura. Patient alert and oriented, warm and dry, in no acute distress. Patient denies SI, HI, AVH and pain. Patient made aware of Q15 minute rounds and Psychologist, counsellingover and Officer presence for their safety. Patient instructed to come to me with needs or concerns.

## 2017-11-11 NOTE — Consult Note (Signed)
[place] [facility] Consult   Reason for Consult: Consult for this 47 year old woman who is back in our emergency room for the third time in the past week because of aggression at the [facility] Referring Physician: Jacqualine Code Patient Identification: Kinjal Neitzke MRN:  [id number] Principal Diagnosis: Alcohol-induced persisting dementia [facility]) Diagnosis:   Patient Active Problem List   Diagnosis Date Noted  . Alcohol-induced persisting dementia ([place]) [F10.27] 11/04/2017  . Agitation [R45.1] 11/04/2017    Total Time spent with patient: 30 minutes  Subjective:   Sophiah Rolin is a 47 y.o. female patient admitted with "I am tired of being treated like shit".  HPI: Patient seen chart reviewed.  Patient known from previous consults.  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  In the time since she is been there she has repeatedly been aggressive or violent threatening and unacceptably uncooperative with staff at the [facility].  Patient is not depressed not manic and not psychotic but has a brain injury and appears to be something of a difficult person underneath it as well.  Patient on interview tonight says that she figures that they must be "fed up" with her at the [facility] but claims to have no memory of what actually happened there.  The commitment paper says that she is continuing to refuse to bathe refused to take medicine not eating well.  Patient claims to have no memory of any of this.  Denies suicidal or homicidal thoughts.  Not reporting psychotic symptoms.  Social history: Evidently she has a guardian although it still not clear to me whether that is a friend or family member or a person appointed through social services.  I do not know that whether we have actually been in touch with that person.  Patient is from down closer to the [facility] but was placed at the [facility] after being  discharged from the hospital and knew him and over  Medical history: Persisting dementia  Substance abuse history: Apparently long-standing or at least very heavy alcohol abuse  Past Psychiatric History: As far as I can tell there was not any past psychiatric history other than for substance abuse problems.  Now she has been back in our emergency room 3 times because of her behavior.  Risk to Self:   Risk to Others:   Prior Inpatient Therapy:   Prior Outpatient Therapy:    Past Medical History:  Past Medical History:  Diagnosis Date  . Alcohol dependence ([place])   . Anxiety   . Hypertension   . Tachycardia     Past Surgical History:  Procedure Laterality Date  . CESAREAN SECTION     x2   Family History: No family history on file. Family Psychiatric  History: Unknown Social History:  Social History   Substance and Sexual Activity  Alcohol Use Not Currently  . Frequency: Never   Comment: states hasn't had a drink in months     Social History   Substance and Sexual Activity  Drug Use Never    Social History   Socioeconomic History  . Marital status: Divorced    Spouse name: Not on file  . Number of children: Not on file  . Years of education: Not on file  . Highest education level: Not on file  Occupational History  . Not on file  Social Needs  . Financial resource strain: Not on file  . Food insecurity:  Worry: Not on file    Inability: Not on file  . Transportation needs:    Medical: Not on file    Non-medical: Not on file  Tobacco Use  . Smoking status: Never Smoker  . Smokeless tobacco: Never Used  Substance and Sexual Activity  . Alcohol use: Not Currently    Frequency: Never    Comment: states hasn't had a drink in months  . Drug use: Never  . Sexual activity: Not on file  Lifestyle  . Physical activity:    Days per week: Not on file    Minutes per session: Not on file  . Stress: Not on file  Relationships  . Social connections:    Talks  on phone: Not on file    Gets together: Not on file    Attends religious service: Not on file    Active member of club or organization: Not on file    Attends meetings of clubs or organizations: Not on file    Relationship status: Not on file  Other Topics Concern  . Not on file  Social History Narrative  . Not on file   Additional Social History:    Allergies:  No Known Allergies  Labs:  Results for orders placed or performed during the hospital encounter of 11/11/17 (from the past 48 hour(s))  CBC     Status: None   Collection Time: 11/11/17  5:27 PM  Result Value Ref Range   WBC 6.7 3.6 - 11.0 K/uL   RBC 4.78 3.80 - 5.20 MIL/uL   Hemoglobin 13.6 12.0 - 16.0 g/dL   HCT 39.8 35.0 - 47.0 %   MCV 83.3 80.0 - 100.0 fL   MCH 28.4 26.0 - 34.0 pg   MCHC 34.1 32.0 - 36.0 g/dL   RDW 13.2 11.5 - 14.5 %   Platelets 303 150 - 440 K/uL    Comment: Performed at [facility], [address] [place], [place] [address]  Basic metabolic panel     Status: None   Collection Time: 11/11/17  5:27 PM  Result Value Ref Range   Sodium 139 135 - 145 mmol/L   Potassium 4.0 3.5 - 5.1 mmol/L   Chloride 107 98 - 111 mmol/L   CO2 24 22 - 32 mmol/L   Glucose, Bld 96 70 - 99 mg/dL   BUN 9 6 - 20 mg/dL   Creatinine, Ser 0.91 0.44 - 1.00 mg/dL   Calcium 9.1 8.9 - 10.3 mg/dL   GFR calc non Af Amer >60 >60 mL/min   GFR calc Af Amer >60 >60 mL/min    Comment: (NOTE) The eGFR has been calculated using the CKD EPI equation. This calculation has not been validated in all clinical situations. eGFR's persistently <60 mL/min signify possible Chronic Kidney Disease.    Anion gap 8 5 - 15    Comment: Performed at [facility], [address] [place], [place] [address]  Acetaminophen level     Status: Abnormal   Collection Time: 11/11/17  5:27 PM  Result Value Ref Range   Acetaminophen (Tylenol), Serum <10 (L) 10 - 30 ug/mL    Comment: (NOTE) Therapeutic concentrations vary  significantly. A range of 10-30 ug/mL  may be an effective concentration for many patients. However, some  are best treated at concentrations outside of this range. Acetaminophen concentrations >150 ug/mL at 4 hours after ingestion  and >50 ug/mL at 12 hours after ingestion are often associated with  toxic reactions. Performed  at [facility], [address] [place], [place] [address]   Salicylate level     Status: None   Collection Time: 11/11/17  5:27 PM  Result Value Ref Range   Salicylate Lvl <2.7 2.8 - 30.0 mg/dL    Comment: Performed at [facility], [address] [place], [place] [address]    Current Facility-Administered Medications  Medication Dose Route Frequency Provider Last Rate Last Dose  . QUEtiapine (SEROQUEL) tablet 100 mg  100 mg Oral TID Herma Uballe T, MD      . ziprasidone (GEODON) injection 20 mg  20 mg Intramuscular Once Windie Marasco, Madie Reno, MD       No current outpatient medications on file.    Musculoskeletal: Strength & Muscle Tone: within normal limits Gait & Station: normal Patient leans: N/A  Psychiatric Specialty Exam: Physical Exam  Nursing note and vitals reviewed. Constitutional: She appears well-developed and well-nourished.  HENT:  Head: Normocephalic and atraumatic.  Eyes: Pupils are equal, round, and reactive to light. Conjunctivae are normal.  Neck: Normal range of motion.  Cardiovascular: Regular rhythm and normal heart sounds.  Respiratory: Effort normal. No respiratory distress.  GI: Soft.  Musculoskeletal: Normal range of motion.  Neurological: She is alert.  Skin: Skin is warm and dry.  Psychiatric: Her affect is angry, blunt and inappropriate. Her speech is delayed. She is slowed. Cognition and memory are impaired. She expresses impulsivity and inappropriate judgment. She expresses no homicidal and no suicidal ideation. She exhibits abnormal recent memory and abnormal remote memory.    Review of Systems   Constitutional: Negative.   HENT: Negative.   Eyes: Negative.   Respiratory: Negative.   Cardiovascular: Negative.   Gastrointestinal: Negative.   Musculoskeletal: Negative.   Skin: Negative.   Neurological: Negative.   Psychiatric/Behavioral: Positive for memory loss. Negative for depression, hallucinations, substance abuse and suicidal ideas. The patient is not nervous/anxious and does not have insomnia.     Blood pressure (!) 122/98, pulse 90, temperature 98 F (36.7 C), temperature source Oral, resp. rate 18, height _0  (1.727 m), weight 74.8 kg, SpO2 100 %.Body mass index is 25.07 kg/m.  General Appearance: Casual  Eye Contact:  Fair  Speech:  Slow  Volume:  Decreased  Mood:  Dysphoric and Irritable  Affect:  Flat  Thought Process:  Goal Directed  Orientation:  Negative  Thought Content:  Tangential  Suicidal Thoughts:  No  Homicidal Thoughts:  No  Memory:  Immediate;   Fair Recent;   Poor Remote;   Poor  Judgement:  Impaired  Insight:  Lacking  Psychomotor Activity:  Decreased  Concentration:  Concentration: Poor  Recall:  Poor  Fund of Knowledge:  Poor  Language:  Poor  Akathisia:  No  Handed:  Right  AIMS (if indicated):     Assets:  Physical Health  ADL's:  Impaired  Cognition:  Impaired,  Moderate  Sleep:        Treatment Plan Summary: Medication management and Plan 47 year old woman who is proving herself to be very resistant to attempts to get her settled in at a memory care unit.  Patient is not depressed not manic and not psychotic.  Really does not have a mental illness so much as a neurologic condition.  No specific treatment for this.  Medications can be piled on until perhaps it causes her to become tractable but there is little reason to think that it is actually going to treat anything.  Patient really ought  to probably go back to the [facility] although at this point in the evening it seems unlikely that we will be getting her back there.   Additionally I am anticipating the [facility] may be putting up more resistance at some point.  Meantime we will give her a shot of Geodon to calm down the anger that is threatening to disruptive the emergency room and put her back on the standing dose of Seroquel.  I will leave the involuntary commitment paperwork in place at least overnight in case she were to try to act out and the security staff would be needed.  Disposition: No evidence of imminent risk to self or others at present.   Patient does not meet criteria for psychiatric inpatient admission. Supportive therapy provided about ongoing stressors.  Alethia Berthold, MD 11/11/2017 7:56 PM

## 2017-11-11 NOTE — ED Notes (Signed)
Pt placed in room 22. This tech entered room with pt and explained that her changing her clothes is not our rules but hospital policy and it does not just apply to her but to all who come back to this area. Pt was mumbling that she is retarded and making funny noise (being sarcastic), but pt cooperated with this tech and changed her clothes and put on hospital scrubs. Pt belongings placed in hospital bag and labeled and numbered 1 of 1. Belongings consist of flip flops, underwear, shorts, shirt, bra, purse and notebook was already in bag when received by triage RN. Pt informed that supper tray will be ordered and pt is cordial at this time and sitting on bed. Pt did say "thank you" to this tech.

## 2017-11-11 NOTE — ED Notes (Signed)
Attempt to call legal guardian at number listed, no answer.

## 2017-11-11 NOTE — ED Notes (Signed)
Pt. Transferred to BHU from ED to room 4 after screening for contraband. Report to include Situation, Background, Assessment and Recommendations from Hewan RN. Pt. Oriented to unit including Q15 minute rounds as well as the security cameras for their protection. Patient is alert and oriented, warm and dry in no acute distress. Patient denies SI, HI, and AVH. Pt. Encouraged to let me know if needs arise. 

## 2017-11-12 NOTE — ED Notes (Signed)
Pt given breakfast tray. Pt ask where she was and was told [facility] ED and she wanted to know why she was in a room like "this". I informed pt she was in the ED and this was the room she was placed in to stay overnight and we would see what the doctor says today and see if she will be leaving. She was fine with that and is eating breakfast.

## 2017-11-12 NOTE — ED Notes (Signed)
This NT and Hewan, RN went into pt room to collect vitals, pt would not wake up, this NT and RN shook pt and finally pt woke up and was very hostile with this NT and Hewan, RN, pt stated " please just come on back with your Goddamn knife why don't you, goddamn this is fucking ridiculous".

## 2017-11-12 NOTE — ED Notes (Signed)
Hourly rounding reveals patient sleeping in room. No complaints, stable, in no acute distress. Q15 minute rounds and monitoring via Security Cameras to continue. 

## 2017-11-12 NOTE — ED Notes (Signed)
[facility]. Slammed room door after she left room to go to rest room. Not responding to redirection.

## 2017-11-12 NOTE — ED Notes (Signed)
Hourly rounding reveals patient in room sleeping. No complaints, stable, in no acute distress. Q15 minute rounds and monitoring via Security Cameras to continue.  

## 2017-11-12 NOTE — ED Notes (Signed)
Patient received lunch tray, no signs of distress at this time.

## 2017-11-12 NOTE — ED Notes (Signed)
Patient up to bathroom

## 2017-11-12 NOTE — ED Notes (Signed)
Nurse called guardian Effie Shyanecia Farrior at [phone] and was able to leave voice message regarding discharge.

## 2017-11-12 NOTE — ED Notes (Addendum)
Nurse attempted to call [facility] to let them know she would be needing transport for discharge, and no answer, will try again.

## 2017-11-12 NOTE — ED Provider Notes (Signed)
-----------------------------------------   5:05 PM on 11/12/2017 -----------------------------------------   Blood pressure (!) 122/98, pulse 90, temperature 98 F (36.7 C), temperature source Oral, resp. rate 18, height 5\' 8"  (1.727 m), weight 74.8 kg, SpO2 100 %.  The patient had no acute events since last update.  Calm and cooperative at this time.  Patient clinically sober at this time.  Dr. Toni Amendlapacs has reverse the patient's involuntary commitment and deemed the patient appropriate for outpatient treatment.  The patient will be discharged back to her group home at this time.    Myrna BlazerSchaevitz, Wah Sabic Matthew, MD 11/12/17 [phone]

## 2017-11-12 NOTE — ED Notes (Signed)
IVC rescinded/ Plan to return to Group home
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
## 2017-11-12 NOTE — ED Notes (Signed)
Pt refused to take her HS medications. She sates " I only take my blood pressure medication". She didn't eat her PM snack and beverage. Recheck VS.

## 2017-11-12 NOTE — ED Notes (Addendum)
Report to include Situation, Background, Assessment, and Recommendations received from [employer] Wendy. Patient sleeping, respirations regular and unlabored. While waking pt to do VS and assessment pt was aggressive to NT and this Clinical research associatewriter. We were able to do VS but unable to do assessment. Q15 minute rounds and security camera observation to continue.

## 2017-11-12 NOTE — ED Provider Notes (Signed)
-----------------------------------------   6:31 AM on 11/12/2017 -----------------------------------------   Blood pressure (!) 122/98, pulse 90, temperature 98 F (36.7 C), temperature source Oral, resp. rate 18, height 5\' 8"  (1.727 m), weight 74.8 kg, SpO2 100 %.  The patient had no acute events since last update.  Calm and cooperative at this time.  Disposition is pending Psychiatry/Behavioral Medicine team recommendations.     Irean HongSung, Jade J, MD 11/12/17 [phone]

## 2017-11-12 NOTE — ED Notes (Signed)
[facility] other [facility] rooms, yelling out cursing. Not responding to redirection.

## 2017-11-12 NOTE — ED Notes (Signed)
Pt given supper tray.

## 2017-11-12 NOTE — ED Notes (Signed)
[facility] and no answer.

## 2017-11-12 NOTE — ED Notes (Signed)
Dr. Toni Amendlapacs talking with Patient in room.

## 2017-11-12 NOTE — ED Notes (Signed)
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 Patient agreed.

## 2017-11-13 MED ORDER — ZIPRASIDONE MESYLATE 20 MG IM SOLR
20.0000 mg | Freq: Once | INTRAMUSCULAR | Status: AC
  Administered 2017-11-13: 20 mg via INTRAMUSCULAR

## 2017-11-13 MED ORDER — QUETIAPINE FUMARATE 200 MG PO TABS
200.0000 mg | ORAL_TABLET | Freq: Three times a day (TID) | ORAL | Status: DC
  Administered 2017-11-13 – 2017-11-14 (×3): 200 mg via ORAL
  Filled 2017-11-13 (×3): qty 1

## 2017-11-13 NOTE — ED Notes (Signed)
Hourly rounding reveals patient in room. No complaints, stable, in no acute distress. Q15 minute rounds and monitoring via Security Cameras to continue. 
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
## 2017-11-13 NOTE — ED Notes (Signed)
Hourly rounding reveals patient in room sleeping. No complaints, stable, in no acute distress. Q15 minute rounds and monitoring via Security Cameras to continue.  

## 2017-11-13 NOTE — ED Provider Notes (Signed)
Nurse let me know the patient woke up extreme the agitated this morning, yelling, not redirectable.  She called to let me know that she gave the patient the one-time Geodon order that was ordered from 9/17.   I had initially misunderstood on the phone call and placed the Geodon order for today, however it was given under the prior order.  I was able to go see the patient and patient was calm and interacting with me fairly normally.  She stated that she "felt like a prisoner.  "She has been here it looks like for several days actually awaiting contact with her legal guardian and [facility].  I have replaced consultation with Dr. Toni Amendlapacs, psychiatrist, because if she is as agitated apparently as she was this morning, she would likely be sent back to the ER.     Governor RooksLord, Jabreel Chimento, MD 11/13/17 [phone]

## 2017-11-13 NOTE — ED Provider Notes (Signed)
-----------------------------------------   4:55 AM on 11/13/2017 -----------------------------------------   Blood pressure (!) 90/53, pulse (!) 103, temperature 98.2 F (36.8 C), temperature source Oral, resp. rate 18, height 5\' 8"  (1.727 m), weight 74.8 kg, SpO2 99 %.  The patient had no acute events since last update.  Calm and cooperative at this time.  She has been discharged, however unable to get hold of group home or legal guardian.    Merrily Brittleifenbark, Jamacia Jester, MD 11/13/17 [phone]

## 2017-11-13 NOTE — ED Notes (Signed)
Pt given supper tray and ginger ale 

## 2017-11-13 NOTE — ED Provider Notes (Signed)
I spoke with Dr. Toni Amendlapacs, face-to-face in the ED, is recommending hospital admission here.   Governor RooksLord, Jamile Rekowski, MD 11/13/17 [phone]
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
## 2017-11-14 ENCOUNTER — Inpatient Hospital Stay
Admission: AD | Admit: 2017-11-14 | Discharge: 2018-01-02 | DRG: 897 | Disposition: A | Discharge status: Home / Self Care | Payer: Medicaid Other | Attending: Psychiatry | Admitting: Psychiatry

## 2017-11-14 ENCOUNTER — Other Ambulatory Visit: Payer: Self-pay

## 2017-11-14 DIAGNOSIS — E512 Wernicke's encephalopathy: Secondary | ICD-10-CM | POA: Diagnosis present

## 2017-11-14 DIAGNOSIS — F04 Amnestic disorder due to known physiological condition: Secondary | ICD-10-CM

## 2017-11-14 DIAGNOSIS — F1027 Alcohol dependence with alcohol-induced persisting dementia: Secondary | ICD-10-CM | POA: Diagnosis present

## 2017-11-14 DIAGNOSIS — F09 Unspecified mental disorder due to known physiological condition: Secondary | ICD-10-CM | POA: Diagnosis present

## 2017-11-14 DIAGNOSIS — I10 Essential (primary) hypertension: Secondary | ICD-10-CM | POA: Diagnosis present

## 2017-11-14 DIAGNOSIS — F1026 Alcohol dependence with alcohol-induced persisting amnestic disorder: Secondary | ICD-10-CM | POA: Diagnosis present

## 2017-11-14 DIAGNOSIS — F99 Mental disorder, not otherwise specified: Secondary | ICD-10-CM | POA: Diagnosis not present

## 2017-11-14 DIAGNOSIS — F0630 Mood disorder due to known physiological condition, unspecified: Secondary | ICD-10-CM

## 2017-11-14 DIAGNOSIS — F3110 Bipolar disorder, current episode manic without psychotic features, unspecified: Secondary | ICD-10-CM | POA: Diagnosis not present

## 2017-11-14 DIAGNOSIS — F39 Unspecified mood [affective] disorder: Secondary | ICD-10-CM | POA: Insufficient documentation

## 2017-11-14 HISTORY — DX: Major depressive disorder, single episode, unspecified: F32.9

## 2017-11-14 HISTORY — DX: Depression, unspecified: F32.A

## 2017-11-14 HISTORY — DX: Unspecified dementia, unspecified severity, without behavioral disturbance, psychotic disturbance, mood disturbance, and anxiety: F03.90

## 2017-11-14 MED ORDER — ZIPRASIDONE MESYLATE 20 MG IM SOLR
20.0000 mg | Freq: Once | INTRAMUSCULAR | Status: AC
  Administered 2017-11-14: 20 mg via INTRAMUSCULAR
  Filled 2017-11-14: qty 20

## 2017-11-14 MED ORDER — ACETAMINOPHEN 325 MG PO TABS: 650.0000 mg | ORAL_TABLET | Freq: Four times a day (QID) | ORAL | Status: DC | PRN

## 2017-11-14 MED ORDER — ALUM & MAG HYDROXIDE-SIMETH 200-200-20 MG/5ML PO SUSP: 30.0000 mL | ORAL | Status: DC | PRN

## 2017-11-14 MED ORDER — QUETIAPINE FUMARATE 200 MG PO TABS
200.0000 mg | ORAL_TABLET | Freq: Three times a day (TID) | ORAL | Status: DC
  Administered 2017-11-15 – 2017-11-18 (×8): 200 mg via ORAL
  Filled 2017-11-14 (×11): qty 1

## 2017-11-14 MED ORDER — MIDAZOLAM HCL 5 MG/ML IJ SOLN
4.0000 mg | Freq: Once | INTRAMUSCULAR | Status: DC
  Filled 2017-11-14: qty 4

## 2017-11-14 MED ORDER — MIDAZOLAM HCL 5 MG/5ML IJ SOLN
INTRAMUSCULAR | Status: AC
  Filled 2017-11-14: qty 5

## 2017-11-14 MED ORDER — MAGNESIUM HYDROXIDE 400 MG/5ML PO SUSP: 30.0000 mL | Freq: Every day | ORAL | Status: DC | PRN

## 2017-11-14 MED ORDER — HYDROXYZINE HCL 50 MG PO TABS
50.0000 mg | ORAL_TABLET | ORAL | Status: DC | PRN
  Filled 2017-11-14 (×4): qty 1

## 2017-11-14 NOTE — [facility] Group Notes (Signed)
[facility] Group Notes:  (Nursing/MHT/Case Management/Adjunct)  Date:  11/14/2017  Time:  9:40 PM  Type of Therapy:  Group Therapy  Participation Level:  Did Not Attend  Jolinda Pinkstaff L Daruis Swaim 11/14/2017, 9:40 PM 

## 2017-11-14 NOTE — Progress Notes (Addendum)
Received Joanne Johnston from the ED, alert but unsure why she is here in the hospital. She stated arriving here by ambulance, but unsure why. She was oriented on the date. She endorsed feeling anxious and depressed related to her current situation. She denied feeling suicidal or homicidal. She has a notebook where she stores pertinent information. She was hostile and angry during the admission process which was unproductive. She was oriented to her new environment and given the opportunity to ask questions. The skin assessment was completed with Cara, MHT. She was given a unit packet along with her code for visitors and phone calls.

## 2017-11-14 NOTE — ED Notes (Signed)
Pt refusing to sign voluntary consent for inpatient treatment. Dr. Toni Amendlapacs placed patient under IVC.

## 2017-11-14 NOTE — BH Assessment (Addendum)
[facility] by Dr. Toni Amendlapacs.  Attending Physician will be Dr. Johnella MoloneyMcNew.   Patient has [facility] to room 304, [facility].   Intake Paper Work has been signed and placed on patient chart.  ER staff is aware of the admission:  Louann ER Secretary    Dr. Roxan Hockeyobinson, ER MD   Amy Patient's Nurse   Ethelene BrownsAnthony Patient Access.

## 2017-11-14 NOTE — ED Provider Notes (Signed)
-----------------------------------------   6:16 AM on 11/14/2017 -----------------------------------------   Blood pressure 128/87, pulse 92, temperature 98.1 F (36.7 C), temperature source Oral, resp. rate 18, height 5\' 8"  (1.727 m), weight 74.8 kg, SpO2 100 %.  The patient had no acute events since last update.  Calm and cooperative at this time. I am told it would be 9/21 at the earliest before a bed is available.    Irean HongSung, Jade J, MD 11/14/17 [phone]

## 2017-11-14 NOTE — ED Notes (Signed)
Pt yelled x1 from her bed and laid back down.    Maintained on 15 minute checks and observation by security camera for safety.

## 2017-11-14 NOTE — ED Notes (Signed)
Pt discharged under IVC to BMU. Report given to Randa EvensJoanne, [place]RN. VS stable.  All belongings will be sent with patient.

## 2017-11-14 NOTE — ED Notes (Addendum)
Pt yelling and cursing loudly, demanding coffee. Verbal de-escalation unsuccessful.  EDP notified. IM Geodon ordered and administered with back up officers.   Maintained on 15 minute checks and observation by security camera for safety.

## 2017-11-14 NOTE — Tx Team (Signed)
Initial Treatment Plan 11/14/2017 6:17 PM Joanne DyerJennifer Natal ZOX:[id number]    PATIENT STRESSORS: Other: People, buildings, rooms   PATIENT STRENGTHS: Capable of independent living Physical Health Supportive family/friends   PATIENT IDENTIFIED PROBLEMS: Memory problems                     DISCHARGE CRITERIA:  Ability to meet basic life and health needs Adequate post-discharge living arrangements Improved stabilization in mood, thinking, and/or behavior  PRELIMINARY DISCHARGE PLAN: Return to previous living arrangement  PATIENT/FAMILY INVOLVEMENT: This treatment plan has been presented to and reviewed with the patient, Joanne DyerJennifer Johnston.  The patient has been given the opportunity to ask questions and make suggestions.  Rex KrasJoanne  Treacy Holcomb, RN 11/14/2017, 6:17 PM

## 2017-11-14 NOTE — ED Notes (Signed)
Patient slammed door, screamed, flashed her breasts toward the camera, and stuck up her middle finger.

## 2017-11-15 DIAGNOSIS — F99 Mental disorder, not otherwise specified: Secondary | ICD-10-CM

## 2017-11-15 DIAGNOSIS — F1027 Alcohol dependence with alcohol-induced persisting dementia: Principal | ICD-10-CM

## 2017-11-15 MED ORDER — TRAZODONE HCL 100 MG PO TABS
100.0000 mg | ORAL_TABLET | Freq: Every evening | ORAL | Status: DC | PRN
  Administered 2017-11-16 – 2017-12-20 (×9): 100 mg via ORAL
  Filled 2017-11-15 (×14): qty 1

## 2017-11-15 MED ORDER — LORAZEPAM 2 MG/ML IJ SOLN
1.0000 mg | Freq: Four times a day (QID) | INTRAMUSCULAR | Status: DC | PRN
  Administered 2017-11-15: 1 mg via INTRAMUSCULAR
  Filled 2017-11-15: qty 1

## 2017-11-15 MED ORDER — BENZTROPINE MESYLATE 1 MG/ML IJ SOLN
1.0000 mg | Freq: Four times a day (QID) | INTRAMUSCULAR | Status: DC | PRN
  Administered 2017-11-16 – 2017-11-22 (×3): 1 mg via INTRAMUSCULAR
  Filled 2017-11-15 (×3): qty 2

## 2017-11-15 MED ORDER — LORAZEPAM 2 MG/ML IJ SOLN
1.0000 mg | Freq: Four times a day (QID) | INTRAMUSCULAR | Status: DC | PRN
  Administered 2017-11-16 – 2017-11-22 (×3): 1 mg via INTRAMUSCULAR
  Filled 2017-11-15 (×4): qty 1

## 2017-11-15 MED ORDER — LORAZEPAM 2 MG/ML IJ SOLN: 1.0000 mg | Freq: Four times a day (QID) | INTRAMUSCULAR | Status: DC

## 2017-11-15 MED ORDER — BENZTROPINE MESYLATE 1 MG/ML IJ SOLN
1.0000 mg | Freq: Once | INTRAMUSCULAR | Status: AC
  Administered 2017-11-15: 1 mg via INTRAMUSCULAR
  Filled 2017-11-15: qty 2

## 2017-11-15 MED ORDER — HALOPERIDOL LACTATE 5 MG/ML IJ SOLN
5.0000 mg | Freq: Four times a day (QID) | INTRAMUSCULAR | Status: DC | PRN
  Administered 2017-11-15 – 2017-11-22 (×4): 5 mg via INTRAMUSCULAR
  Filled 2017-11-15 (×4): qty 1

## 2017-11-15 NOTE — Progress Notes (Signed)
D: Upon arrival to the unit for shift, pt. Was observed resting in her room, isolative and withdrawn sleeping. Pt. Visually assessed by this writer with no distress noted. Pt. Respirations even and unlabored. Pt. Interaction with this Clinical research associatewriter brief, denies pain and SI/HI, but interaction and participation patient willing to give minimal. No other assessment information able to be obtained. Mood appears apathetic. Pt. Sleeping a majority of the shift.    A: Q x 15 minute observation checks were completed for safety. Patient was attempted to be provided with education, but refused. Pt. Chart and plans of care reviewed. Pt. Given support and encouragement.   R: Pt. Does not participate in unit activities or groups. No medications scheduled for this evening. Pt. Monitored for safety. Pt. Unable to be complaint with EKG. Pt. Refuses to participate in testings as well. Will notify treatment team of needed EKG.             Precautionary checks every 15 minutes for safety maintained, room free of safety hazards, patient sustains no injury or falls during this shift. Will endorse care to next shift.

## 2017-11-15 NOTE — Plan of Care (Signed)
Pt. Isolative and withdrawn to her room this evening. Pt. Interaction minimal, refuses to participate. Pt. Monitored by staff for safety. Denies SI/HI.    Problem: Coping: Goal: Ability to interact with others will improve Outcome: Not Progressing   Problem: Safety: Goal: Ability to remain free from injury will improve Outcome: Progressing

## 2017-11-15 NOTE — Progress Notes (Signed)
Patient is alert and oriented to place and self with periods of confusion to time and situations, denies SI/HI/AVH but noted responding to internal stimuli, her affect is hostile , argumentative , irritable , verbally hostile, threatening and casusing at staff using profanities and yelling shouting disrupting the milieu. Patient was frequently redirected and given support , 15 minutes checks maintained will continue to monitor.

## 2017-11-15 NOTE — Progress Notes (Signed)
D- Patient alert and oriented. Patient presents in a brighter mood than this morning. Patient is more polite and thanking this Clinical research associatewriter for helping her. Patient denies SI, HI, AVH, and pain at this time. Patient states that she has issues with her mood and depression, however, she could not rate her depression for this Clinical research associatewriter. Patient has no stated goals for today.  A- Scheduled medications administered to patient, per MD orders. Support and encouragement provided.  Routine safety checks conducted every 15 minutes.  Patient informed to notify staff with problems or concerns.  R- No adverse drug reactions noted. Patient contracts for safety at this time. Patient compliant with medications and treatment plan. Patient receptive, calm, and cooperative. Patient interacts well with others on the unit.  Patient remains safe at this time.

## 2017-11-15 NOTE — Plan of Care (Signed)
  Problem: Coping: Goal: Ability to identify and develop effective coping behavior will improve Outcome: Progressing  Patient is coping effectively

## 2017-11-15 NOTE — Progress Notes (Signed)
This writer attempted to notify patient's father of restraint event, but there was no answer. This Clinical research associatewriter will call again.

## 2017-11-15 NOTE — Progress Notes (Signed)
Aprox. @0900  patient is very agitated, confused, disorganized. Patient is threatening staff, is screaming. Patient has attempted to barricade herself in her room.  Orders obtained from provided Dr. He over phone. Patient is offered medication but refuses, continues to threaten and berate staff, patient continues to scream. Patient is assisted to bed and medications are given IM to patient right and left buttocks. Patient is resistant to verbal directions but complies with safety hold and is not physically resistant. Patient is left in room, continues to berate staff. Patient tolerated treatment well and no obvious injury was observed at this time. Patient will continue to be monitored.

## 2017-11-15 NOTE — [facility] Suicide Risk Assessment (Signed)
[facility] Admission Suicide Risk Assessment   [facility] information obtained from:  Patient Demographic factors:  Living alone, Unemployed, Caucasian Current Mental Status:  NA Loss Factors:  NA Historical Factors:  NA Risk Reduction Factors:  NA  Total Time spent with patient: 1.5 hours Principal Problem: Organic psychosis Diagnosis:   Patient Active Problem List   Diagnosis Date Noted  . 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 Agitation [R45.1] 11/04/2017   Subjective Data: see HPI  Continued Clinical Symptoms:    The "Alcohol Use Disorders Identification Test", Guidelines for Use in Primary Care, Second Edition.  World Science writerHealth Organization [facility](WHO). Score between 0-7:  no or low risk or alcohol related problems. Score between 8-15:  moderate risk of alcohol related problems. Score between 16-19:  high risk of alcohol related problems. Score 20 or above:  warrants further diagnostic evaluation for alcohol dependence and treatment.   CLINICAL FACTORS:   Alcohol/Substance Abuse/Dependencies More than one psychiatric diagnosis   Musculoskeletal: Strength & Muscle Tone: within normal limits Gait & Station: normal Patient leans: N/A  Psychiatric Specialty Exam: see HPI Physical Exam  ROS  Blood pressure 115/84, pulse (!) 119, temperature 98.4 F (36.9 C), temperature source Oral, resp. rate 20, height 5\' 8"  (1.727 m), weight 80.7 kg, SpO2 100 %.Body mass index is 27.06 kg/m.                                                          COGNITIVE FEATURES THAT CONTRIBUTE TO RISK:  Closed-mindedness and Thought constriction (tunnel vision)    SUICIDE RISK:   Minimal: No identifiable suicidal ideation.  Patients presenting with no risk factors but with morbid ruminations; may be classified as minimal risk based on the severity of the depressive  symptoms  PLAN OF CARE:  See HPI  I certify that inpatient services furnished can reasonably be expected to improve the patient's condition.   Christelle Igoe, MD 11/15/2017, 1:36 PM

## 2017-11-15 NOTE — [facility] Group Notes (Signed)
LCSW Group Therapy Note  11/15/2017 1:15pm  Type of Therapy and Topic: Group Therapy: Holding on to Grudges   Participation Level: Did Not Attend   Description of Group:  In this group patients will be asked to explore and define a grudge. Patients will be guided to discuss their thoughts, feelings, and reasons as to why people have grudges. Patients will process the impact grudges have on daily life and identify thoughts and feelings related to holding grudges. Facilitator will challenge patients to identify ways to let go of grudges and the benefits this provides. Patients will be confronted to address why one struggles letting go of grudges. Lastly, patients will identify feelings and thoughts related to what life would look like without grudges. This group will be process-oriented, with patients participating in exploration of their own experiences, giving and receiving support, and processing challenge from other group members.  Therapeutic Goals:  1. Patient will identify specific grudges related to their personal life.  2. Patient will identify feelings, thoughts, and beliefs around grudges.  3. Patient will identify how one releases grudges appropriately.  4. Patient will identify situations where they could have let go of the grudge, but instead chose to hold on.   Summary of Patient Progress: Pt was invited to attend group but chose not to attend. CSW will continue to encourage pt to attend group throughout their admission.    Therapeutic Modalities:  Cognitive Behavioral Therapy  Solution Focused Therapy  Motivational Interviewing  Brief Therapy   Janeah Kovacich  [name], LCSW 11/15/2017 12:41 PM

## 2017-11-15 NOTE — H&P (Signed)
Psychiatric Admission Assessment Adult  Patient Identification: Joanne Johnston MRN:  [id number] Date of Evaluation:  11/15/2017 Chief Complaint:  psychosis organic Principal Diagnosis: Organic psychosis Diagnosis:   Patient Active Problem List   Diagnosis Date Noted  . 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 She is brought in multiple times from her memory unit, the [facility] for increase agitation and aggression in the past month.  Her seroquel was increased, but pt refused to take the meds.  Thus, she continues to be agitated and aggressive at the center and was again, for the 3rd time brought in to ED for treatment. Thus, she was admitted to psych unit for further management.  Pt seen this morning. She is quite irritable and uncooperative and refuse to talk the Clinical research associate.  Per RN report, this morning, she was screaming on the unit, threaten the staff and attempted to barricade herself in her room. She refused verbal redirection, thus,  IM haldol, ativan and cogentin was ordered and given. Pt was complied with safety hold while injections were given.  She then immediately became "cooperative" and went outside with "peers", as if she was manipulative for the injections or wants for attention.   Later approached the pt again, when she was awake. She is still very irritable, asked the writer rude "what do you want?" "I can't F-- talk right now". "I don't take medicine other than my blood pressure medicine!"  When asked if she understands that she is in the hospital, she said "what do you think, I am just in my home that looks like a hospital!"  The writer reiterated that she needs to remain calm and respectful towards to other  patients and our staff on the unit, she signaled understanding with a rude salute and waved to Retail banker to get out of her room.   Pt's legal guardian, is Ms. Farrior, per records  Per Epic:  On 06/24/17: she was first brought to [facility] ED for agitation and aggression after 4 month stay in [facility] and was placed in a memory facility in this area.  On 9/10: pt was brought in to ED for increased agitation and aggression at [facility], and she was discharged back with increased seroquel to 100mg  TID. However, pt refused to take her meds. Thus,  On 9/11: she was brought back to ED.  Per ED records, "According to staff at the [facility] she is barricading herself in her room and refusing to take any medications."  Pt started taking her seroquel in ED and was calm, thus, she was discharged back to the facility.  On 9/17: pt again returned to ED and received IM geodon, and Dr. Toni Amend recommended admission to psych unit on 9/20.  Per Dr. Toni Amend' assessment on 9/20: "Patient continues to be agitated labile and unpredictable.  She will have spells of being withdrawn and then have sudden episodes of agitation where she will bang doors try to escape do things that could be possibly harmful to herself.  She appears to be convinced that people are either raping her or trying to rape her.  Frequent outbursts about this and about being a "prisoner".  Patient required medication to calm down at least once today.  Does not seem to be stabilizing  even with oral medicines so far."  Associated Signs/Symptoms: Depression Symptoms:  psychomotor agitation, (Hypo) Manic Symptoms:  Delusions, Impulsivity, Irritable Mood, Labiality of Mood, Anxiety Symptoms:  irritability Psychotic Symptoms:  Delusions, Paranoia, PTSD Symptoms: Negative Total Time spent with patient: 1.5 hours  Past Psychiatric History:  Hx of severe alcohol use disorder with resultant Wernicke Korsakoff Syndrome (dementia) and hx of bipolar  per records, [facility] and finally placed at a memory unit locally [facility] Hx of agitation and aggression at the facility.  However, she frequently refuse to take her medicine and became agitation and aggressive unpredictably, which led to frequent ED visits.   Is the patient at risk to self? No.  Has the patient been a risk to self in the past 6 months? No.  Has the patient been a risk to self within the distant past? No.  Is the patient a risk to others? Yes.    Has the patient been a risk to others in the past 6 months? Yes.    Has the patient been a risk to others within the distant past? Yes.     Prior Inpatient Therapy:   Prior Outpatient Therapy:    Alcohol Screening: 1. How often do you have a drink containing alcohol?: Never 2. How many drinks containing alcohol do you have on a typical day when you are drinking?: 1 or 2 3. How often do you have six or more drinks on one occasion?: Never AUDIT-C Score: 0 Intervention/Follow-up: Brief Advice Substance Abuse History in the last 12 months:  No. Consequences of Substance Abuse: Negative Previous Psychotropic Medications: Yes  Psychological Evaluations: Yes  Past Medical History:  Past Medical History:  Diagnosis Date  . Alcohol dependence (HCC)   . Anxiety   . Dementia   . Depression   . Hypertension   . Tachycardia     Past Surgical History:  Procedure Laterality Date  . CESAREAN SECTION     x2   Family History: History reviewed. No pertinent family history. Family Psychiatric  History:  Tobacco Screening: Have you used any form of tobacco in the last 30 days? (Cigarettes, Smokeless Tobacco, Cigars, and/or Pipes): No Social History:  Social History   Substance and Sexual Activity  Alcohol Use Not Currently  . Frequency: Never   Comment: states hasn't had a drink in months     Social History   Substance and Sexual Activity  Drug Use Never    Additional Social  History:  Allergies:  No Known Allergies Lab Results: No results found for this or any previous visit (from the past 48 hour(s)).  Blood Alcohol level:  Lab Results  Component Value Date   ETH <10 11/04/2017    Metabolic Disorder Labs:  No results found for: HGBA1C, MPG No results found for: PROLACTIN No results found for: CHOL, TRIG, HDL, CHOLHDL, VLDL, LDLCALC  Current Medications: Current Facility-Administered Medications  Medication Dose Route Frequency Provider Last Rate Last Dose  . acetaminophen (TYLENOL) tablet 650 mg  650 mg Oral Q6H PRN Clapacs, John T, MD      . alum & mag hydroxide-simeth (MAALOX/MYLANTA) 200-200-20 MG/5ML suspension 30 mL  30 mL Oral Q4H PRN Clapacs, John T, MD      . haloperidol lactate (HALDOL) injection 5 mg  5 mg Intramuscular Q6H PRN Regis Wiland, MD   5 mg at 11/15/17 0904  . hydrOXYzine (ATARAX/VISTARIL) tablet 50 mg  50 mg Oral Q4H PRN Clapacs,  Jackquline Denmark, MD      . LORazepam (ATIVAN) injection 1 mg  1 mg Intramuscular Q6H PRN Charod Slawinski, MD   1 mg at 11/15/17 0904  . magnesium hydroxide (MILK OF MAGNESIA) suspension 30 mL  30 mL Oral Daily PRN Clapacs, John T, MD      . QUEtiapine (SEROQUEL) tablet 200 mg  200 mg Oral TID Clapacs, Jackquline Denmark, MD       PTA Medications: Medications Prior to Admission  Medication Sig Dispense Refill Last Dose  . Multiple Vitamin (MULTI-VITAMINS) TABS Take 1 tablet by mouth daily.    unknown at unknown  . QUEtiapine (SEROQUEL) 25 MG tablet Take 25 mg by mouth daily.    unknown at unknown  . QUEtiapine (SEROQUEL) 50 MG tablet Take 50 mg by mouth at bedtime.    unknown at unknown  . topiramate (TOPAMAX) 50 MG tablet Take 50 mg by mouth daily.    unknwon at unknown    Musculoskeletal: Strength & Muscle Tone: within normal limits Gait & Station: normal Patient leans: N/A  Psychiatric Specialty Exam: Physical Exam  ROS  Blood pressure 115/84, pulse (!) 119, temperature 98.4 F (36.9 C), temperature source Oral, resp. rate  20, height 5\' 8"  (1.727 m), weight 80.7 kg, SpO2 100 %.Body mass index is 27.06 kg/m.  General Appearance: Fairly Groomed  Eye Contact:  Poor  Speech:  Normal Rate  Volume:  Normal  Mood:  Angry and Irritable  Affect:  Non-Congruent, Inappropriate and Labile  Thought Process:  Disorganized  Orientation:  Other:  to self and place only  Thought Content:  Delusions and Paranoid Ideation  Suicidal Thoughts:  No  Homicidal Thoughts:  not to particular person, but is anger and aggressive towards to staff in the facility and on the psych unit.  Memory:  Immediate;   Poor Recent;   Poor Remote;   Poor  Judgement:  Poor  Insight:  Lacking  Psychomotor Activity:  Normal  Concentration:  Concentration: Poor and Attention Span: Poor  Recall:  Poor  Fund of Knowledge:  Poor  Language:  Fair  Akathisia:  Negative      Assets:  Housing Physical Health  ADL's:  Intact  Cognition:  Impaired,  Moderate  Sleep:  Number of Hours: 4.15    Treatment Plan Summary: Daily contact with patient to assess and evaluate symptoms and progress in treatment and Medication management   Plan:  # Wernicke Korsakoff encephalopathy -- continue seroquel 200mg  TID.  Pt refused this AM dose.  If she continues to refuse po med, might need to consider NEFM.  -- ECG is ordered for QTc monitoring.  -- provide Haldol 5mg  q6hr PRN for agitation -- provide ativan 1mg  q6h PRN for agitation -- provide cogentin 1mg  q6h PRN afor agitation.  -- provide hydroxyzine 50mg  q6h PRN for anxiety or insomnia.  -- recommend to reassess her cognition when she is more cooperative.   # hx of alcohol use disorder.  -- no indication for recent relapse.   Dispo -- supervised living after stabilization, likely continue memory care.    Observation Level/Precautions:  15 minute checks  Laboratory:  CBC Chemistry Profile UDS  Psychotherapy:    Medications:    Consultations:    Discharge Concerns:    Estimated LOS:  Other:      Physician Treatment Plan for Primary Diagnosis: <principal problem not specified> Long Term Goal(s): Improvement in symptoms so as ready for discharge  Short Term Goals: Ability to identify  changes in lifestyle to reduce recurrence of condition will improve  Physician Treatment Plan for Secondary Diagnosis: Active Problems:   Organic psychosis  Long Term Goal(s): Improvement in symptoms so as ready for discharge  Short Term Goals: Ability to identify changes in lifestyle to reduce recurrence of condition will improve  I certify that inpatient services furnished can reasonably be expected to improve the patient's condition.    Crystel Demarco, MD 9/21/201910:47 AM

## 2017-11-15 NOTE — CIRT (Signed)
Post Seclusion/Restraint Episode Mini Treatment Team  Date of Seclusion or Restraint Episode:  11/15/2017 Today's Date:  11/15/2017  List of Patient Triggers/Skill Deficits: Ineffective coping, unstable mood, agitated/irritable  Review of Medications:  Current Facility-Administered Medications  Medication Dose Route Frequency Provider Last Rate Last Dose  . acetaminophen (TYLENOL) tablet 650 mg  650 mg Oral Q6H PRN Clapacs, John T, MD      . alum & mag hydroxide-simeth (MAALOX/MYLANTA) 200-200-20 MG/5ML suspension 30 mL  30 mL Oral Q4H PRN Clapacs, John T, MD      . benztropine mesylate (COGENTIN) injection 1 mg  1 mg Intramuscular Q6H PRN He, Jun, MD      . haloperidol lactate (HALDOL) injection 5 mg  5 mg Intramuscular Q6H PRN He, Jun, MD   5 mg at 11/15/17 0904  . hydrOXYzine (ATARAX/VISTARIL) tablet 50 mg  50 mg Oral Q4H PRN Clapacs, John T, MD      . LORazepam (ATIVAN) injection 1 mg  1 mg Intramuscular Q6H PRN He, Jun, MD      . magnesium hydroxide (MILK OF MAGNESIA) suspension 30 mL  30 mL Oral Daily PRN Clapacs, John T, MD      . QUEtiapine (SEROQUEL) tablet 200 mg  200 mg Oral TID Clapacs, Jackquline DenmarkJohn T, MD   200 mg at 11/15/17 1751    Compliant with Medications:  Yes  Need for Medication Adjustment:  NA  Plan to Prevent Future Episodes of Seclusion and Restraint:  Patient encouraged to come talk with staff if and when thoughts/behaviors change. Patient also re-educated about unit and unit roles. Patient encouraged to     Staff Present:  Physician Jun He, MD  Nurse Practitioner/PA   Pharmacist   Nurse Lillyian Heidt  Nurse Lorenda HatchetSlade  Nurse   MHT/NT Reggie, Nicolette BangMaria, Cara  Counselor/Case Manager   Department Leadership        Mickaela Starlin 11/15/2017, 6:01 PM

## 2017-11-16 ENCOUNTER — Encounter: Payer: Self-pay | Admitting: Psychiatry

## 2017-11-16 NOTE — Progress Notes (Signed)
[facility] MD Progress Note  11/17/2017 4:57 PM Joanne Johnston  MRN:  [id number]  Subjective:    Joanne Johnston met with treatment team today. She is utterly confused. She is no longer agitated but still edgy, argumentative, dismissive. She had a meltdown last night. She now takes medications.  Principal Problem: Organic psychosis Diagnosis:   Patient Active Problem List   Diagnosis Date Noted  . 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 Agitation [R45.1] 11/04/2017   Total Time spent with patient: 20 minutes  Past Psychiatric History: alcoholism  Past Medical History:  Past Medical History:  Diagnosis Date  . Alcohol dependence ([place])   . Anxiety   . Dementia   . Depression   . Hypertension   . Tachycardia     Past Surgical History:  Procedure Laterality Date  . CESAREAN SECTION     x2   Family History: History reviewed. No pertinent family history. Family Psychiatric  History: none Social History:  Social History   Substance and Sexual Activity  Alcohol Use Not Currently  . Frequency: Never   Comment: states hasn't had a drink in months     Social History   Substance and Sexual Activity  Drug Use Never    Social History   Socioeconomic History  . Marital status: Divorced    Spouse name: Not on file  . Number of children: Not on file  . Years of education: Not on file  . Highest education level: Not on file  Occupational History  . Not on file  Social Needs  . Financial resource strain: Not on file  . Food insecurity:    Worry: Not on file    Inability: Not on file  . Transportation needs:    Medical: Not on file    Non-medical: Not on file  Tobacco Use  . Smoking status: Never Smoker  . Smokeless tobacco: Never Used  Substance and Sexual Activity  . Alcohol use: Not Currently    Frequency: Never    Comment: states hasn't had a drink  in months  . Drug use: Never  . Sexual activity: Not Currently  Lifestyle  . Physical activity:    Days per week: Not on file    Minutes per session: Not on file  . Stress: Not on file  Relationships  . Social connections:    Talks on phone: Not on file    Gets together: Not on file    Attends religious service: Not on file    Active member of club or organization: Not on file    Attends meetings of clubs or organizations: Not on file    Relationship status: Not on file  Other Topics Concern  . Not on file  Social History Narrative  . Not on file   Additional Social History:                         Sleep: Fair  Appetite:  Fair  Current Medications: Current Facility-Administered Medications  Medication Dose Route Frequency Provider Last Rate Last Dose  . acetaminophen (TYLENOL) tablet 650 mg  650 mg Oral Q6H PRN Clapacs, John T, MD      . alum & mag hydroxide-simeth (MAALOX/MYLANTA) 200-200-20 MG/5ML suspension 30 mL  30 mL Oral Q4H PRN Clapacs, John T, MD      . benztropine mesylate (COGENTIN) injection 1  mg  1 mg Intramuscular Q6H PRN He, Jun, MD   1 mg at 11/16/17 2153  . haloperidol lactate (HALDOL) injection 5 mg  5 mg Intramuscular Q6H PRN He, Jun, MD   5 mg at 11/16/17 2152  . hydrOXYzine (ATARAX/VISTARIL) tablet 50 mg  50 mg Oral Q4H PRN Clapacs, Madie Reno, MD      . LORazepam (ATIVAN) injection 1 mg  1 mg Intramuscular Q6H PRN He, Jun, MD   1 mg at 11/16/17 2152  . magnesium hydroxide (MILK OF MAGNESIA) suspension 30 mL  30 mL Oral Daily PRN Clapacs, John T, MD      . QUEtiapine (SEROQUEL) tablet 200 mg  200 mg Oral TID Clapacs, John T, MD   200 mg at 11/17/17 1308  . traZODone (DESYREL) tablet 100 mg  100 mg Oral QHS PRN He, Jun, MD   100 mg at 11/16/17 2153    Lab Results: No results found for this or any previous visit (from the past 35 hour(s)).  Blood Alcohol level:  Lab Results  Component Value Date   ETH <10 24/23/5361    Metabolic Disorder  Labs: No results found for: HGBA1C, MPG No results found for: PROLACTIN No results found for: CHOL, TRIG, HDL, CHOLHDL, VLDL, LDLCALC  Physical Findings: AIMS:  , ,  ,  ,    CIWA:    COWS:     Musculoskeletal: Strength & Muscle Tone: within normal limits Gait & Station: normal Patient leans: N/A  Psychiatric Specialty Exam: Physical Exam  Nursing note and vitals reviewed. Psychiatric: Her speech is normal. Her affect is labile and inappropriate. She is hyperactive. Thought content is paranoid and delusional. Cognition and memory are impaired. She expresses impulsivity.    Review of Systems  Neurological: Negative.   Psychiatric/Behavioral: Positive for memory loss and substance abuse.  All other systems reviewed and are negative.   Blood pressure 117/83, pulse 97, temperature 97.6 F (36.4 C), temperature source Oral, resp. rate 16, height '5\' 8"'  (1.727 m), weight 80.7 kg, SpO2 98 %.Body mass index is 27.06 kg/m.  General Appearance: Casual  Eye Contact:  Good  Speech:  Clear and Coherent  Volume:  Normal  Mood:  Angry, Dysphoric and Irritable  Affect:  Congruent  Thought Process:  Irrelevant  Orientation:  Full (Time, Place, and Person)  Thought Content:  Delusions and Paranoid Ideation  Suicidal Thoughts:  No  Homicidal Thoughts:  No  Memory:  Immediate;   Poor Recent;   Poor Remote;   Poor  Judgement:  Poor  Insight:  Lacking  Psychomotor Activity:  Psychomotor Retardation  Concentration:  Concentration: Poor and Attention Span: Poor  Recall:  Poor  Fund of Knowledge:  Poor  Language:  Poor  Akathisia:  No  Handed:  Right  AIMS (if indicated):     Assets:  Communication Skills Desire for Improvement Physical Health Resilience Social Support  ADL's:  Intact  Cognition:  WNL  Sleep:  Number of Hours: 4.75     Treatment Plan Summary: Daily contact with patient to assess and evaluate symptoms and progress in treatment and Medication management   Ms.  Johnston is a 47 year old female with a history of alcoholism admitted for alcohol related psychosis.  #Wernicke Korsakoff encephalopathy -- continue seroquel 267m TID for agitation. Pt refused this med yesterday, but started taking now.  Would need to consider NEFM if she refuses again, or LAI might be helpful in the future.   -- ECG is ordered for  QTc and it is 436 today.  -- provide Haldol 72m q6hr PRN for agitation -- provide ativan 157mq6h PRN for agitation -- provide cogentin 37m3m6h PRN afor agitation.  -- provide hydroxyzine 24m56mh PRN for anxiety or insomnia. -- recommend to reassess her cognition when she is more cooperative.  # hx of alcohol use disorder. -- no indication for recent relapse.   Dispo -- supervised living after stabilization, likely continue memory care.   JolaOrson Slick 11/17/2017, 4:57 PM

## 2017-11-16 NOTE — BHH Counselor (Signed)
Adult Comprehensive Assessment  Patient ID: Joanne Johnston, female   DOB: 23-Mar-1970, 47 y.o.   MRN: [id number]  Information Source: Information source: Patient  Current Stressors:  Patient states their primary concerns and needs for treatment are:: "I was forced to be here. I don't know from the hospital they send me here" Patient states their goals for this hospitilization and ongoing recovery are:: "I can't think of anything" Educational / Learning stressors: none reported Employment / Job issues: none reported Family Relationships: "good and strongEngineer, petroleum / Lack of resources (include bankruptcy): disability Housing / Lack of housing: "cannot remember" Physical health (include injuries & life threatening diseases): HBP Social relationships: "fine" Substance abuse: "pt denies use" Bereavement / Loss: "memory loss"  Living/Environment/Situation:  Living Arrangements: Spouse/significant other Who else lives in the home?: "boyfriend and daughter half the time" How long has patient lived in current situation?: "cannot remember" What is atmosphere in current home: Comfortable  Family History:  Marital status: Single Are you sexually active?: Yes What is your sexual orientation?: heterosexual Has your sexual activity been affected by drugs, alcohol, medication, or emotional stress?: no Does patient have children?: Yes How many children?: 2 How is patient's relationship with their children?: 2 daughters -"great relationship"  Childhood History:  By whom was/is the patient raised?: Both parents Description of patient's relationship with caregiver when they were a child: "healthy" Patient's description of current relationship with people who raised him/her: "very good" How were you disciplined when you got in trouble as a child/adolescent?: "grounded and spanked" Does patient have siblings?: Yes Number of Siblings: 2 Description of patient's current relationship with siblings:  2 brothers- "its fine" Did patient suffer any verbal/emotional/physical/sexual abuse as a child?: No Did patient suffer from severe childhood neglect?: No Has patient ever been sexually abused/assaulted/raped as an adolescent or adult?: No Was the patient ever a victim of a crime or a disaster?: No Witnessed domestic violence?: No Has patient been effected by domestic violence as an adult?: No  Education:  Highest grade of school patient has completed: bachelors degree Currently a Consulting civil engineer?: No Learning disability?: No  Employment/Work Situation:   Employment situation: On disability Why is patient on disability: "cannot remember" How long has patient been on disability: "cannot remember" Patient's job has been impacted by current illness: Yes Describe how patient's job has been impacted: "cannot remember" What is the longest time patient has a held a job?: 4 years Where was the patient employed at that time?: Engineer, 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?: No Are There Guns or Other Weapons in Your Home?: No  Financial Resources:   Financial resources: Insurance claims handler Does patient have a Lawyer or guardian?: Yes Name of representative payee or guardian: Legal guardian  Alcohol/Substance Abuse:   What has been your use of drugs/alcohol within the last 12 months?: pt denies  If attempted suicide, did drugs/alcohol play a role in this?: No Alcohol/Substance Abuse Treatment Hx: Denies past history Has alcohol/substance abuse ever caused legal problems?: No  Social Support System:   Conservation officer, nature Support System: Good Describe Community Support System: family and friends Type of faith/religion: Catholic How does patient's faith help to cope with current illness?: go to church  Leisure/Recreation:   Leisure and Hobbies: spending time with my daughters, going to the beach  Strengths/Needs:    What is the patient's perception of their strengths?: intelligent, kind, quick learner, good sense of humor, resonsible Patient states they can  use these personal strengths during their treatment to contribute to their recovery: "help keep me of task" Patient states these barriers may affect/interfere with their treatment: none reported Patient states these barriers may affect their return to the community: none reported  Discharge Plan:   Currently receiving community mental health services: No Patient states concerns and preferences for aftercare planning are: TBD with CSW and legal guardian  Patient states they will know when they are safe and ready for discharge when: "I don't really know, the people will let me know" Does patient have access to transportation?: Yes(pt will discuss with CSW ) Does patient have financial barriers related to discharge medications?: No Plan for living situation after discharge: TBD with CSW and legal guardian Will patient be returning to same living situation after discharge?: No  Summary/Recommendations:   Summary and Recommendations (to be completed by the evaluator): Patient is a 47 year old female admitted involuntarily and diagnosed with Organic psychosis. with hx of severe alcohol use disorder with resultant Wernicke Korsakoff encephalopathy (dementia). She is [facility] in multiple [facility] for increase agitation and aggression in the past month.  Her Seroquel was increased, but patient refused to take the meds. Patient will benefit from crisis stabilization, medication evaluation, group therapy and psychoeducation. In addition to case management for discharge planning. At discharge it is recommended that patient adhere to the established discharge plan and continue treatment.   Emre Stock  [name]. 11/16/2017

## 2017-11-16 NOTE — Progress Notes (Signed)
Patient refused vital signs this morning. Will update treatment team during report.

## 2017-11-16 NOTE — Progress Notes (Signed)
Patient alert and oriented x 3 with periods of confusion to situation,  will not cooperate with questions writer asked if she was  SI/HI/AVH, upon approach patient is verbally aggressive using profanities, threatening staff, very hostile , posturing, argumentative, yelling and banging the doors loudly. Patient was frequently redirected , but she became more agitated and as a result she was given lorazepam and haldol injection with cogentin which was very effective and patient became less agitated and not hostile to staff. No distress noted, 15 minutes safety checks maintained, will continue to monitor.

## 2017-11-16 NOTE — Progress Notes (Signed)
[facility] Note  11/16/2017 5:08 PM Emiliano DyerJennifer Kruger  MRN:  [id number] Subjective:  Pt seen and chart reviewed. She is much calmer today and started taking her seroquel, although she said that it makes her tired a bit.  She wants to talk to the writer about going somewhere closer to home, and she is reminded that [facility].  Then she mention something about [place].  At this point, she also stated "my memory is bad, I don't know".   Regardless, she is a lot calmer on the unti today, as compared to yesterday.    Principal Problem: Organic psychosis Diagnosis:   Patient Active Problem List   Diagnosis Date Noted  . 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 Agitation [R45.1] 11/04/2017   Total Time spent with patient: 20 minutes  Past Psychiatric History: Hx of severe alcohol use disorder with resultant Wernicke Korsakoff Syndrome (dementia) and hx of bipolar per records, [facility] Hx of agitation and aggression at the facility.  However, she frequently refuse to take her medicine and became agitation and aggressive unpredictably, which led to frequent ED visits.   Past Medical History:  Past Medical History:  Diagnosis Date  . Alcohol dependence ([facility])   . Anxiety   . Dementia   . Depression   . Hypertension   . Tachycardia     Past Surgical History:  Procedure Laterality Date  . CESAREAN SECTION     x2   Family History: History reviewed. No pertinent family history. Family Psychiatric  History: unknown Social History:  Social History   Substance and Sexual Activity  Alcohol Use Not Currently  . Frequency: Never   Comment: states hasn't had a drink in months     Social History   Substance and Sexual Activity  Drug Use Never     Social History   Socioeconomic History  . Marital status: Divorced    Spouse name: Not on file  . Number of children: Not on file  . Years of education: Not on file  . Highest education level: Not on file  Occupational History  . Not on file  Social Needs  . Financial resource strain: Not on file  . Food insecurity:    Worry: Not on file    Inability: Not on file  . Transportation needs:    Medical: Not on file    Non-medical: Not on file  Tobacco Use  . Smoking status: Never Smoker  . Smokeless tobacco: Never Used  Substance and Sexual Activity  . Alcohol use: Not Currently    Frequency: Never    Comment: states hasn't had a drink in months  . Drug use: Never  . Sexual activity: Not Currently  Lifestyle  . Physical activity:    Days per week: Not on file    Minutes per session: Not on file  . Stress: Not on file  Relationships  . Social connections:    Talks on phone: Not on file    Gets together: Not on file    Attends religious service: Not on file    Active member of club or organization: Not on file    Attends meetings of clubs or organizations: Not on file    Relationship status: Not on file  Other Topics Concern  .  Not on file  Social History Narrative  . Not on file   Sleep: Fair  Appetite:  Fair  Current Medications: Current Facility-Administered Medications  Medication Dose Route Frequency Provider Last Rate Last Dose  . acetaminophen (TYLENOL) tablet 650 mg  650 mg Oral Q6H PRN Clapacs, John T, MD      . alum & mag hydroxide-simeth (MAALOX/MYLANTA) 200-200-20 MG/5ML suspension 30 mL  30 mL Oral Q4H PRN Clapacs, John T, MD      . benztropine mesylate (COGENTIN) injection 1 mg  1 mg Intramuscular Q6H PRN Jaxin Fulfer, MD      . haloperidol lactate (HALDOL) injection 5 mg  5 mg Intramuscular Q6H PRN Octavia Mottola, MD   5 mg at 11/15/17 0904  . hydrOXYzine (ATARAX/VISTARIL) tablet 50 mg  50 mg Oral Q4H PRN Clapacs, John T, MD      . LORazepam (ATIVAN) injection  1 mg  1 mg Intramuscular Q6H PRN Keane Martelli, MD      . magnesium hydroxide (MILK OF MAGNESIA) suspension 30 mL  30 mL Oral Daily PRN Clapacs, John T, MD      . QUEtiapine (SEROQUEL) tablet 200 mg  200 mg Oral TID Clapacs, John T, MD   200 mg at 11/16/17 1347  . traZODone (DESYREL) tablet 100 mg  100 mg Oral QHS PRN Aliyah Abeyta, MD        Lab Results: No results found for this or any previous visit (from the past 48 hour(s)).  Blood Alcohol level:  Lab Results  Component Value Date   ETH <10 11/04/2017    Metabolic Disorder Labs: No results found for: HGBA1C, MPG No results found for: PROLACTIN No results found for: CHOL, TRIG, HDL, CHOLHDL, VLDL, LDLCALC  Physical Findings: AIMS:  , ,  ,  ,    CIWA:    COWS:     Musculoskeletal: Strength & Muscle Tone: within normal limits Gait & Station: normal Patient leans: N/A  Psychiatric Specialty Exam: Physical Exam  ROS  Blood pressure 117/83, pulse 97, temperature 97.6 F (36.4 C), temperature source Oral, resp. rate 16, height 5\' 8"  (1.727 m), weight 80.7 kg, SpO2 98 %.Body mass index is 27.06 kg/m.  General Appearance: Casual  Eye Contact:  Good  Speech:  Normal Rate  Volume:  Normal  Mood:  Anxious  Affect:  Blunt and Constricted  Thought Process:  Disorganized  Orientation:  Other:  place and person  Thought Content:  Illogical  Suicidal Thoughts:  No  Homicidal Thoughts:  No  Memory:  Immediate;   Poor Recent;   Poor Remote;   Poor  Judgement:  Poor  Insight:  Lacking  Psychomotor Activity:  Normal  Concentration:  Concentration: Poor and Attention Span: Poor  Recall:  Poor  Fund of Knowledge:  Poor  Language:  Poor        Assets:  Physical Health Resilience  ADL's:  Intact  Cognition:  Impaired,  Moderate  Sleep:  Number of Hours: 8.15     Treatment Plan Summary: Daily contact with patient to assess and evaluate symptoms and progress in treatment and Medication management   # Wernicke Korsakoff  encephalopathy -- continue seroquel 200mg  TID for agitation. Pt refused this med yesterday, but started taking now.  Would need to consider NEFM if she refuses again, or LAI might be helpful in the future.   -- ECG is ordered for QTc and it is 436 today.  -- provide Haldol 5mg  q6hr PRN for agitation --  provide ativan 1mg  q6h PRN for agitation -- provide cogentin 1mg  q6h PRN afor agitation.  -- provide hydroxyzine 50mg  q6h PRN for anxiety or insomnia.  -- recommend to reassess her cognition when she is more cooperative.   # hx of alcohol use disorder.  -- no indication for recent relapse.   Dispo -- supervised living after stabilization, likely continue memory care.    Rafel Garde, MD 11/16/2017, 5:08 PM

## 2017-11-16 NOTE — Progress Notes (Signed)
Received Victorino DikeJennifer this AM after breakfast,she was compliant with her Seroquel. She is evasive this am during our one to one interaction. She remains visible in the milieu at interval and went outside in the courtyard with her peers. She has continued to be compliant with her medications and calm throughout the day. Her EKG was completed.

## 2017-11-16 NOTE — BHH Group Notes (Signed)
LCSW Group Therapy Note 11/16/2017 1:15pm  Type of Therapy and Topic: Group Therapy: Feelings Around Returning Home & Establishing a Supportive Framework and Supporting Oneself When Supports Not Available  Participation Level: Minimal  Description of Group:  Patients first processed thoughts and feelings about upcoming discharge. These included fears of upcoming changes, lack of change, new living environments, judgements and expectations from others and overall stigma of mental health issues. The group then discussed the definition of a supportive framework, what that looks and feels like, and how do to discern it from an unhealthy non-supportive network. The group identified different types of supports as well as what to do when your family/friends are less than helpful or unavailable  Therapeutic Goals  1. Patient will identify one healthy supportive network that they can use at discharge. 2. Patient will identify one factor of a supportive framework and how to tell it from an unhealthy network. 3. Patient able to identify one coping skill to use when they do not have positive supports from others. 4. Patient will demonstrate ability to communicate their needs through discussion and/or role plays.  Summary of Patient Progress:  Patient scored her mood at a 6 (10best.) She did not participate in group discussion.   Therapeutic Modalities Cognitive Behavioral Therapy Motivational Interviewing   Joanne Johnston  [name], LCSW 11/16/2017 10:54 AM

## 2017-11-17 NOTE — Progress Notes (Signed)
Recreation Therapy Notes  Date: 11/17/2017  Time: 9:30 am   Location: Craft room   Behavioral response: N/A   Intervention Topic: Problem Solving  Discussion/Intervention: Patient did not attend group.   Clinical Observations/Feedback:  Patient did not attend group.   Rael Tilly LRT/CTRS        Davielle Lingelbach 11/17/2017 10:19 AM

## 2017-11-17 NOTE — Plan of Care (Addendum)
Patient found asleep in bed upon my arrival. Patient is isolative to her room throughout the evening. Patient does not attend group. Patient is minimally verbal and dismissive. Reports being tired. Refused snack. Denies SI/HI/AVH with shake of head. Does not have any scheduled HS medications. Q 15 minute checks maintained. Will continue to monitor throughout the shift. Patient slept 7.75 hours. No apparent distress. Patient is rude and belligerent while attempting am VS. "Is this necessary? You guys treat me like an animal! You bitch!" Refused O2Sat monitor and temp. Will endorse care to oncoming shift.  Problem: Coping: Goal: Ability to identify and develop effective coping behavior will improve Outcome: Not Progressing Goal: Ability to interact with others will improve Outcome: Not Progressing Goal: Ability to use eye contact when communicating with others will improve Outcome: Not Progressing   Problem: Self-Concept: Goal: Will verbalize positive feelings about self Outcome: Not Progressing

## 2017-11-17 NOTE — Plan of Care (Signed)
  Problem: Safety: Goal: Ability to remain free from injury will improve Outcome: Progressing  Patient continues to be injury free.

## 2017-11-17 NOTE — Tx Team (Addendum)
Interdisciplinary Treatment and Diagnostic Plan Update  11/17/2017 Time of Session: 1030am Joanne DyerJennifer Johnston MRN: [id number]  Principal Diagnosis: Organic psychosis  Secondary Diagnoses: Principal Problem:   Organic psychosis Active Problems:   Alcohol-induced persisting dementia (HCC)   Current Medications:  Current Facility-Administered Medications  Medication Dose Route Frequency Provider Last Rate Last Dose  . acetaminophen (TYLENOL) tablet 650 mg  650 mg Oral Q6H PRN Clapacs, John T, MD      . alum & mag hydroxide-simeth (MAALOX/MYLANTA) 200-200-20 MG/5ML suspension 30 mL  30 mL Oral Q4H PRN Clapacs, John T, MD      . benztropine mesylate (COGENTIN) injection 1 mg  1 mg Intramuscular Q6H PRN He, Jun, MD   1 mg at 11/16/17 2153  . haloperidol lactate (HALDOL) injection 5 mg  5 mg Intramuscular Q6H PRN He, Jun, MD   5 mg at 11/16/17 2152  . hydrOXYzine (ATARAX/VISTARIL) tablet 50 mg  50 mg Oral Q4H PRN Clapacs, John T, MD      . LORazepam (ATIVAN) injection 1 mg  1 mg Intramuscular Q6H PRN He, Jun, MD   1 mg at 11/16/17 2152  . magnesium hydroxide (MILK OF MAGNESIA) suspension 30 mL  30 mL Oral Daily PRN Clapacs, John T, MD      . QUEtiapine (SEROQUEL) tablet 200 mg  200 mg Oral TID Clapacs, John T, MD   200 mg at 11/17/17 1308  . traZODone (DESYREL) tablet 100 mg  100 mg Oral QHS PRN He, Jun, MD   100 mg at 11/16/17 2153   PTA Medications: Medications Prior to Admission  Medication Sig Dispense Refill Last Dose  . Multiple Vitamin (MULTI-VITAMINS) TABS Take 1 tablet by mouth daily.    unknown at unknown  . QUEtiapine (SEROQUEL) 25 MG tablet Take 25 mg by mouth daily.    unknown at unknown  . QUEtiapine (SEROQUEL) 50 MG tablet Take 50 mg by mouth at bedtime.    unknown at unknown  . topiramate (TOPAMAX) 50 MG tablet Take 50 mg by mouth daily.    unknwon at unknown    Patient Stressors: Other: People, buildings, rooms  Patient Strengths: Capable of independent living Physical  Health Supportive family/friends  Treatment Modalities: Medication Management, Group therapy, Case management,  1 to 1 session with clinician, Psychoeducation, Recreational therapy.   Physician Treatment Plan for Primary Diagnosis: Organic psychosis Long Term Goal(s): Improvement in symptoms so as ready for discharge Improvement in symptoms so as ready for discharge   Short Term Goals: Ability to identify changes in lifestyle to reduce recurrence of condition will improve Ability to identify changes in lifestyle to reduce recurrence of condition will improve  Medication Management: Evaluate patient's response, side effects, and tolerance of medication regimen.  Therapeutic Interventions: 1 to 1 sessions, Unit Group sessions and Medication administration.  Evaluation of Outcomes: Progressing  Physician Treatment Plan for Secondary Diagnosis: Principal Problem:   Organic psychosis Active Problems:   Alcohol-induced persisting dementia (HCC)  Long Term Goal(s): Improvement in symptoms so as ready for discharge Improvement in symptoms so as ready for discharge   Short Term Goals: Ability to identify changes in lifestyle to reduce recurrence of condition will improve Ability to identify changes in lifestyle to reduce recurrence of condition will improve     Medication Management: Evaluate patient's response, side effects, and tolerance of medication regimen.  Therapeutic Interventions: 1 to 1 sessions, Unit Group sessions and Medication administration.  Evaluation of Outcomes: Progressing   RN Treatment Plan for Primary  Diagnosis: Organic psychosis Long Term Goal(s): Knowledge of disease and therapeutic regimen to maintain health will improve  Short Term Goals: Ability to identify and develop effective coping behaviors will improve and Compliance with prescribed medications will improve  Medication Management: RN will administer medications as ordered by provider, will assess  and evaluate patient's response and provide education to patient for prescribed medication. RN will report any adverse and/or side effects to prescribing provider.  Therapeutic Interventions: 1 on 1 counseling sessions, Psychoeducation, Medication administration, Evaluate responses to treatment, Monitor vital signs and CBGs as ordered, Perform/monitor CIWA, COWS, AIMS and Fall Risk screenings as ordered, Perform wound care treatments as ordered.  Evaluation of Outcomes: Progressing   LCSW Treatment Plan for Primary Diagnosis: Organic psychosis Long Term Goal(s): Safe transition to appropriate next level of care at discharge, Engage patient in therapeutic group addressing interpersonal concerns.  Short Term Goals: Engage patient in aftercare planning with referrals and resources, Identify triggers associated with mental health/substance abuse issues and Increase skills for wellness and recovery  Therapeutic Interventions: Assess for all discharge needs, 1 to 1 time with Social worker, Explore available resources and support systems, Assess for adequacy in community support network, Educate family and significant other(s) on suicide prevention, Complete Psychosocial Assessment, Interpersonal group therapy.  Evaluation of Outcomes: Progressing   Progress in Treatment: Attending groups: No. Participating in groups: No. Taking medication as prescribed: Yes. Toleration medication: Yes. Family/Significant other contact made: No, will contact:    Patient understands diagnosis: No. Discussing patient identified problems/goals with staff: Yes. Medical problems stabilized or resolved: Yes. Denies suicidal/homicidal ideation: Yes. Issues/concerns per patient self-inventory: No. Other:    New problem(s) identified: No, Describe:     New Short Term/Long Term Goal(s):  Patient Goals:  To be discharged  Discharge Plan or Barriers: Back to ALF program  Reason for Continuation of Hospitalization:  Aggression Anxiety Delusions  Depression  Estimated Length of Stay: 5-7 days  Recreational Therapy: Patient Stressors: N/A Patient Goal: Patient will engage in interactions with peers and staff in pro-social manner at least 2x within 5 recreation therapy group sessions  Attendees: Patient:Joanne Johnston 11/17/2017 5:51 PM  Physician: Braulio Conte Pucilowska 11/17/2017 5:51 PM  Nursing:  11/17/2017 5:51 PM  RN Care Manager: 11/17/2017 5:51 PM  Social Worker: Jake Shark, LCSW 11/17/2017 5:51 PM  Recreational Therapist: Garret Reddish, LRT 11/17/2017 5:51 PM  Other: Joneen Roach, LCSWA 11/17/2017 5:51 PM  Other: Damian Leavell, Chaplain 11/17/2017 5:51 PM  Other: 11/17/2017 5:51 PM    Scribe for Treatment Team: Glennon Mac, LCSW 11/17/2017 5:51 PM

## 2017-11-17 NOTE — Progress Notes (Signed)
This Clinical research associatewriter spoke with patient's mother and she stated to this Clinical research associatewriter that patient's laptop and [facility] tablet [facility]. Patient believes that we have her belongings, so this Clinical research associatewriter allowed patient to speak with her mother so that she could clarify this for patient.

## 2017-11-17 NOTE — Progress Notes (Signed)
   11/17/17 1030  Clinical Encounter Type  Visited With Patient  Visit Type Initial;Psychological support;Behavioral Health  Referral From Social work  Consult/Referral To Chaplain  Spiritual Encounters  Spiritual Needs Emotional;Other (Comment)   CH attended the patient's treatment team meeting. Ms. Joanne Johnston is very short with her answers toward Dr. Jennet Johnston. She appeared to be annoyed with the meeting. She was first brought to the hospital because she got angry. Ms. Joanne Johnston does think thy are anything wrong with her. Sje wants to get home and care for her kids.

## 2017-11-17 NOTE — Progress Notes (Signed)
EVS informed security, who in turn informed this Clinical research associatewriter, that they attempted to clean patient's room, but patient was very nasty to them and did not want them to clean her room. EVS informed security and this writer that patient's room needs to be cleaned, but patient would not allow it.

## 2017-11-17 NOTE — Progress Notes (Signed)
Recreation Therapy Notes  INPATIENT RECREATION THERAPY ASSESSMENT  Patient Details Name: Joanne DyerJennifer Johnston MRN: [id number] DOB: [birth year] Today's Date: 11/17/2017       Information Obtained From: (Patient refused assessment)  Able to Participate in Assessment/Interview:    Patient Presentation:    Reason for Admission (Per Patient):    Patient Stressors:    Coping Skills:      Leisure Interests (2+):     Frequency of Recreation/Participation:    Awareness of Community Resources:     [facility] Resources:     Current Use:    If no, Barriers?:    Expressed Interest in [facility] [employer] CorporationCommunity Resource Information:    [place]     Patient Main Form of Transportation:    Patient Strengths:     Patient Identified Areas of Improvement:     Patient Goal for Hospitalization:     Current SI (including self-harm):     Current HI:     Current AVH:    Staff Intervention Plan:    Consent to Intern Participation:    Joanne Johnston 11/17/2017, 3:57 PM

## 2017-11-17 NOTE — Progress Notes (Signed)
D- Patient alert and oriented. Patient presents in an irritable/preoccupied mood on assessment asking this Clinical research associatewriter where are her belongings "my laptop and tablet". Patient is pleasant with this Clinical research associatewriter although preoccupied with her belongings. Patient had no major complaints to voice at this time. Patient denies SI, HI, AVH, and pain at this time. Patient also denies any signs/symptoms of depression. Patient had no stated goals for today  A- Support and encouragement provided.  Routine safety checks conducted every 15 minutes.  Patient informed to notify staff with problems or concerns.  R- Patient contracts for safety at this time. Patient compliant with medications and treatment plan. Patient receptive, calm, and cooperative. Patient interacts well with others on the unit.  Patient remains safe at this time.

## 2017-11-17 NOTE — [facility] Group Notes (Signed)
[facility] Group Notes:  (Nursing/MHT/Case Management/Adjunct)  Date:  11/17/2017  Time:  9:31 PM  Type of Therapy:  Group Therapy  Participation Level:  Did Not Attend  Mayra NeerJackie L Jamorris Ndiaye 11/17/2017, 9:31 PM

## 2017-11-18 DIAGNOSIS — F39 Unspecified mood [affective] disorder: Secondary | ICD-10-CM | POA: Insufficient documentation

## 2017-11-18 DIAGNOSIS — F3110 Bipolar disorder, current episode manic without psychotic features, unspecified: Secondary | ICD-10-CM

## 2017-11-18 MED ORDER — QUETIAPINE FUMARATE 200 MG PO TABS
300.0000 mg | ORAL_TABLET | Freq: Two times a day (BID) | ORAL | Status: DC
  Administered 2017-11-18 – 2017-11-20 (×3): 300 mg via ORAL
  Filled 2017-11-18 (×6): qty 1

## 2017-11-18 NOTE — Progress Notes (Signed)
Patient refused her afternoon Seroquel, stating to this writer that she would take the evening dose. This Clinical research associatewriter will notify MD.

## 2017-11-18 NOTE — Progress Notes (Signed)
D- Patient alert and oriented. Patient presents in a pleasant mood on assessment stating that she slept "pretty good" last night and had no major complaints to voice to this Clinical research associatewriter. Patient denies SI, HI, AVH, and pain at this time. Patient also denies signs/symptoms of depression and anxiety to this Clinical research associatewriter. Patient's goal for today is to "figure out what the hell is going on".  A- Scheduled medications administered to patient, per MD orders. Support and encouragement provided.  Routine safety checks conducted every 15 minutes.  Patient informed to notify staff with problems or concerns.  R- No adverse drug reactions noted. Patient contracts for safety at this time. Patient compliant with medications and treatment plan. Patient receptive, calm, and cooperative. Patient interacts well with others on the unit.  Patient remains safe at this time.

## 2017-11-18 NOTE — Plan of Care (Addendum)
Patient found on the phone upon my arrival. Patient is visible and social this evening. Early in the evening, patient is pleasant and cooperative. Social with peers. Affect is animated. Behavior is friendly. Attends group appropriately. Patient denies all complaints including SI/HI/AVH. Denies pain. Reports eating and voiding adequately. Eats large snack. Patient becomes slightly irritable during medication administration but is able to control herself. Upon going to bed, patient becomes agitated and angry with MHT performing Q 15 checks. Patient is briefly loud and threatening verbally but when left alone, she fell asleep. Patient's lability is remarkable. Appearance is improved. Less isolative. Compliant with HS medications and staff direction. Given trazodone for sleep with positive results. Q 15 minute checks maintained. Will continue to monitor throughout the shift. Patient slept 6.75 hours. No apparent distress. Patient refused VS this am. Woke in irritable mood, yelling at staff, "Stop treating me like an animal!" Will endorse care to oncoming shift.  Problem: Activity: Goal: Will identify at least one activity in which they can participate Outcome: Progressing   Problem: Coping: Goal: Ability to identify and develop effective coping behavior will improve Outcome: Progressing Goal: Ability to interact with others will improve Outcome: Progressing Goal: Ability to use eye contact when communicating with others will improve Outcome: Progressing   Problem: Self-Concept: Goal: Will verbalize positive feelings about self Outcome: Progressing

## 2017-11-18 NOTE — Plan of Care (Signed)
Patient has been observed out in the milieu, outside in the courtyard, as well as interacting with other members on the unit without any issues at this time. Patient has attended and participated in unit groups thus far without any issues. Patient has used fair eye contact while communicating with this Clinical research associatewriter. Patient has the ability to identify the available resources that can assist her in meeting her health-care needs, however, she has not voiced anything to this Clinical research associatewriter as of yet. Patient's goal for today is to "figure out what the hell is going on". Patient has been free from injury and remains safe on the unit at this time.  Problem: Activity: Goal: Will identify at least one activity in which they can participate Outcome: Progressing   Problem: Coping: Goal: Ability to identify and develop effective coping behavior will improve Outcome: Progressing Goal: Ability to interact with others will improve Outcome: Progressing Goal: Demonstration of participation in decision-making regarding own care will improve Outcome: Progressing Goal: Ability to use eye contact when communicating with others will improve Outcome: Progressing   Problem: Health Behavior/Discharge Planning: Goal: Identification of resources available to assist in meeting health care needs will improve Outcome: Progressing   Problem: Self-Concept: Goal: Will verbalize positive feelings about self Outcome: Progressing   Problem: Safety: Goal: Ability to remain free from injury will improve Outcome: Progressing

## 2017-11-18 NOTE — Progress Notes (Signed)
[facility] MD Progress Note  11/18/2017 2:52 PM Joanne Johnston  MRN:  [id number] Subjective:  Pt is much calmer today. She is observed socializing with other peers and spending time outside with no behavioral outbursts at all today. She was also very calm with this provider. Initially she was a little irritable but did open up and was pleasant. She states that she is here because "I have alcohol dementia I guess." She states, "I guess I was a little testy with people." She states that she gets upset because she does not like to be locked in the hospital and was hospitalized for over 3 months recently. She has been taking Seroquel although did refuse afternoon dose. She if oriented to person, place, ([place], [place]), Month, year and president. She states that she slept well last night. We discussed possibly consolidating her Seroquel to twice a day instead of having to take a dose 3 times a day. She states that she really appreciates this. We discussed that if she can keep her behaviors calm that she could return to [facility] soon. She states, "That gives me hope. I think I can control my attitude." She denies SI or HI.   Principal Problem: Bipolar I disorder, most recent episode (or current) manic (HCC) Diagnosis:   Patient Active Problem List   Diagnosis Date Noted  . 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 Agitation [R45.1] 11/04/2017   Total Time spent with patient: 30 minutes  Past Psychiatric History: See h&P  Past Medical History:  Past Medical History:  Diagnosis Date  . Alcohol dependence (HCC)   . Anxiety   . Dementia   . Depression   . Hypertension   . Tachycardia     Past Surgical History:  Procedure Laterality Date  . CESAREAN SECTION     x2   Family History: History reviewed. No pertinent family history. Family Psychiatric  History: See H&P Social History:  Social  History   Substance and Sexual Activity  Alcohol Use Not Currently  . Frequency: Never   Comment: states hasn't had a drink in months     Social History   Substance and Sexual Activity  Drug Use Never    Social History   Socioeconomic History  . Marital status: Divorced    Spouse name: Not on file  . Number of children: Not on file  . Years of education: Not on file  . Highest education level: Not on file  Occupational History  . Not on file  Social Needs  . Financial resource strain: Not on file  . Food insecurity:    Worry: Not on file    Inability: Not on file  . Transportation needs:    Medical: Not on file    Non-medical: Not on file  Tobacco Use  . Smoking status: Never Smoker  . Smokeless tobacco: Never Used  Substance and Sexual Activity  . Alcohol use: Not Currently    Frequency: Never    Comment: states hasn't had a drink in months  . Drug use: Never  . Sexual activity: Not Currently  Lifestyle  . Physical activity:    Days per week: Not on file    Minutes per session: Not on file  . Stress: Not on file  Relationships  . Social connections:    Talks on phone: Not on file    Gets together: Not on file    Attends religious  service: Not on file    Active member of club or organization: Not on file    Attends meetings of clubs or organizations: Not on file    Relationship status: Not on file  Other Topics Concern  . Not on file  Social History Narrative  . Not on file   Additional Social History:                         Sleep: Good  Appetite:  Fair  Current Medications: Current Facility-Administered Medications  Medication Dose Route Frequency Provider Last Rate Last Dose  . acetaminophen (TYLENOL) tablet 650 mg  650 mg Oral Q6H PRN Clapacs, John T, MD      . alum & mag hydroxide-simeth (MAALOX/MYLANTA) 200-200-20 MG/5ML suspension 30 mL  30 mL Oral Q4H PRN Clapacs, John T, MD      . benztropine mesylate (COGENTIN) injection 1 mg  1  mg Intramuscular Q6H PRN He, Jun, MD   1 mg at 11/16/17 2153  . haloperidol lactate (HALDOL) injection 5 mg  5 mg Intramuscular Q6H PRN He, Jun, MD   5 mg at 11/16/17 2152  . hydrOXYzine (ATARAX/VISTARIL) tablet 50 mg  50 mg Oral Q4H PRN Clapacs, John T, MD      . LORazepam (ATIVAN) injection 1 mg  1 mg Intramuscular Q6H PRN He, Jun, MD   1 mg at 11/16/17 2152  . magnesium hydroxide (MILK OF MAGNESIA) suspension 30 mL  30 mL Oral Daily PRN Clapacs, John T, MD      . QUEtiapine (SEROQUEL) tablet 300 mg  300 mg Oral BID McNew, Ileene HutchinsonHolly R, MD      . traZODone (DESYREL) tablet 100 mg  100 mg Oral QHS PRN He, Jun, MD   100 mg at 11/16/17 2153    Lab Results: No results found for this or any previous visit (from the past 48 hour(s)).  Blood Alcohol level:  Lab Results  Component Value Date   ETH <10 11/04/2017    Metabolic Disorder Labs: No results found for: HGBA1C, MPG No results found for: PROLACTIN No results found for: CHOL, TRIG, HDL, CHOLHDL, VLDL, LDLCALC  Physical Findings: AIMS:  , ,  ,  ,    CIWA:    COWS:     Musculoskeletal: Strength & Muscle Tone: within normal limits Gait & Station: normal Patient leans: N/A  Psychiatric Specialty Exam: Physical Exam  Nursing note and vitals reviewed.   Review of Systems  All other systems reviewed and are negative.   Blood pressure (!) 96/59, pulse 76, temperature 97.6 F (36.4 C), temperature source Oral, resp. rate 18, height 5\' 8"  (1.727 m), weight 80.7 kg, SpO2 98 %.Body mass index is 27.06 kg/m.  General Appearance: Casual  Eye Contact:  Good  Speech:  Clear and Coherent  Volume:  Normal  Mood:  Euthymic  Affect:  Congruent  Thought Process:  Coherent and Goal Directed  Orientation:  Full (Time, Place, and Person)  Thought Content:  Logical  Suicidal Thoughts:  No  Homicidal Thoughts:  No  Memory:  Immediate;   Poor  Judgement:  Impaired  Insight:  Lacking  Psychomotor Activity:  Normal  Concentration:   Concentration: Poor  Recall:  Poor  Fund of Knowledge:  Fair  Language:  Fair  Akathisia:  No      Assets:  Resilience  ADL's:  Intact  Cognition:  Impaired,  Moderate  Sleep:  Number of Hours: 7.75  Treatment Plan Summary: 47 yo female admitted due to agitation and aggression on memory care unit. She has periods of agitation on the unit. She is very calm and appropriate today with staff and this provider. She is spending time with peers and socializing appropriately. She is organized in thoughts today. She is also fully oriented today.   Plan:  Mood -Will consolidate Seroquel to 300 mg BID instead of TID dosing as she has tendency to refuse medications -continue prn IM haldol  Alcohol induced dementia _She is fully oriented today -She does have periods of confusion -Will discharge back to memory care unit  Dispo -Discharge back to [facility] likely this week. I left message with guardian with call back number  Haskell Riling, MD 11/18/2017, 2:52 PM

## 2017-11-18 NOTE — Progress Notes (Signed)
Recreation Therapy Notes  Date: 11/18/2017  Time: 9:30 am  Location: Craft Room  Behavioral response: Appropriate  Intervention Topic: Emotions  Discussion/Intervention:  Group content on today was focused on emotions. The group identified what emotions are and why it is important to have emotions. Patients expressed some positive and negative emotions. Individuals gave some past experiences on how they normally dealt with emotions in the past. The group described some positive ways to deal with emotions in the future. Patients participated in the intervention "The Situation" where individuals were given a chance to respond to certain situations involving their emotions.  Clinical Observations/Feedback:  Patient came to group and was focused on what peers and staff had to say about emotions.Individual was social with peers while participating in the intervention. Judene Logue LRT/CTRS         Chardae Mulkern 11/18/2017 11:19 AM
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
## 2017-11-18 NOTE — Progress Notes (Signed)
MD notified about patient refusing her afternoon Seroquel.

## 2017-11-18 NOTE — [facility] Group Notes (Signed)
[facility]  Date/Time: 11/18/17  Type of Therapy/Topic:  Group Therapy:  Feelings about Diagnosis  Participation Level:  Active   Mood:pleasant   Description of Group:    This group will allow patients to explore their thoughts and feelings about diagnoses they have received. Patients will be guided to explore their level of understanding and acceptance of these diagnoses. Facilitator will encourage patients to process their thoughts and feelings about the reactions of others to their diagnosis, and will guide patients in identifying ways to discuss their diagnosis with significant others in their lives. This group will be process-oriented, with patients participating in exploration of their own experiences as well as giving and receiving support and challenge from other group members.   Therapeutic Goals: 1. Patient will demonstrate understanding of diagnosis as evidence by identifying two or more symptoms of the disorder:  2. Patient will be able to express two feelings regarding the diagnosis 3. Patient will demonstrate ability to communicate their needs through discussion and/or role plays  Summary of Patient Progress:Pt attentive during group where symptoms of bipolar disorder were discussed.  Pt active during the discussion and made a number of excellent comments.          Therapeutic Modalities:   Cognitive Behavioral Therapy Brief Therapy Feelings Identification   Daleen SquibbGreg Dealie Koelzer, LCSW

## 2017-11-19 NOTE — Progress Notes (Signed)
Recreation Therapy Notes   Date: 11/19/2017  Time: 9:30 am  Location: Craft Room  Behavioral response: Appropriate, Agitated   Intervention Topic: Leisure  Discussion/Intervention:  Group content today was focused on leisure. The group defined what leisure is and some positive leisure activities they participate in. Individuals identified the difference between good and bad leisure. Participants expressed how they feel after participating in the leisure of their choice. The group discussed how they go about picking a leisure activity and if others are involved in their leisure activities. The patient stated how many leisure activities they too choose from and reasons why it is important to have leisure time. Individuals participated in the intervention "Exploration of Leisure" where they had a chance to identify new leisure activities as well as benefits of leisure.  Clinical Observations/Feedback:  Patient attended group and was focused on what peers and staff had to say about leisure. Individual became agitated during group and left slamming the door.     Amika Tassin LRT/CTRS           Victorian Gunn 11/19/2017 1:08 PM

## 2017-11-19 NOTE — Plan of Care (Signed)
Patient has been observed out in the milieu, outside in the courtyard, as well as interacting with other members on the unit without any issues at this time. Patient has attended unit groups, however, she didn't stay long, but has not had any issues thus far. Patient has used fair eye contact while communicating with this Clinical research associatewriter. Patient has the ability to identify the available resources that can assist her in meeting her health-care needs, however, she has not voiced anything to this Clinical research associatewriter as of yet. Patient's goal for today is "getting freed from this institution". Patient has been free from injury and remains safe on the unit at this time.  Problem: Activity: Goal: Will identify at least one activity in which they can participate Outcome: Progressing   Problem: Coping: Goal: Ability to identify and develop effective coping behavior will improve Outcome: Progressing Goal: Ability to interact with others will improve Outcome: Progressing Goal: Demonstration of participation in decision-making regarding own care will improve Outcome: Progressing Goal: Ability to use eye contact when communicating with others will improve Outcome: Progressing   Problem: Health Behavior/Discharge Planning: Goal: Identification of resources available to assist in meeting health care needs will improve Outcome: Progressing   Problem: Self-Concept: Goal: Will verbalize positive feelings about self Outcome: Progressing   Problem: Safety: Goal: Ability to remain free from injury will improve Outcome: Progressing

## 2017-11-19 NOTE — Progress Notes (Signed)
2041 Pt irritable and cursing this RN during assessment. Refuse to talk during the assessment. Cursing "when can I get some food." Very child like, cursing "fuck you bitch" Screaming " I want to get out of this place." Banging on the day room door. RN will continue to monitor.

## 2017-11-19 NOTE — Progress Notes (Signed)
MD informed this writer to move patient's evening Seroquel to bedtime because she took her morning dose later than scheduled.

## 2017-11-19 NOTE — Progress Notes (Signed)
[facility] MD Progress Note  11/19/2017 12:45 PM Joanne Johnston  MRN:  [id number] Subjective:  Pt had episode of verbal agitation last evening in response to not being allowed to have certain clothes or makeup on the unit. She was able to calm herself down and was pleasant during the night. She was irritable in the morning when asked to get vitals but the rest of the morning was very calm and interacting very appropriately with peers. She then had a phone call and was upset on the phone that she was a prisoner here. She then slammed the phone and ran into her room and was very verbally agitated. She was left alone and she did calm herself down. It appears her pattern is to get verbally agitated and seek out attention to staff and then immediately calm down.   Upon evaluation about 25 mins after outburst, she initially was irritable stating "what do you want. Are you going to tie me up?"I brought up subject of discharge and she immediately calmed down and was very calm, pleasant and appropriate. She apologized for her behaviors. She states, "I know I'm having outbursts. I just feel like I'm a prisoner." She states that she immediately feels guilty for acting like that. We discussed that she needs to take her medications in order to get out of the hospital. She states that she is willing to do this because she really wants to be discharged. She also agrees o continue it at the [facility] because she does not want to have to come back to the hospital. She states that things that help her are writing things down. She wrote down some things about our conversation particularly about things she needs to do to be discharged. She was fully oriented. She was able to have a very rational and appropriate conversation. She then agreed to take her morning SErouqel.  Principal Problem: Unspecified mood (affective) disorder (HCC) Diagnosis:   Patient Active Problem List   Diagnosis Date Noted  . Unspecified mood (affective)  disorder (HCC) [F39] 11/18/2017    Priority: High  . 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 Agitation [R45.1] 11/04/2017   Total Time spent with patient: 30 minutes  Past Psychiatric History: See h&p  Past Medical History:  Past Medical History:  Diagnosis Date  . Alcohol dependence (HCC)   . Anxiety   . Dementia   . Depression   . Hypertension   . Tachycardia     Past Surgical History:  Procedure Laterality Date  . CESAREAN SECTION     x2   Family History: History reviewed. No pertinent family history. Family Psychiatric  History: See H&P Social History:  Social History   Substance and Sexual Activity  Alcohol Use Not Currently  . Frequency: Never   Comment: states hasn't had a drink in months     Social History   Substance and Sexual Activity  Drug Use Never    Social History   Socioeconomic History  . Marital status: Divorced    Spouse name: Not on file  . Number of children: Not on file  . Years of education: Not on file  . Highest education level: Not on file  Occupational History  . Not on file  Social Needs  . Financial resource strain: Not on file  . Food insecurity:    Worry: Not on file    Inability: Not on file  . Transportation needs:  Medical: Not on file    Non-medical: Not on file  Tobacco Use  . Smoking status: Never Smoker  . Smokeless tobacco: Never Used  Substance and Sexual Activity  . Alcohol use: Not Currently    Frequency: Never    Comment: states hasn't had a drink in months  . Drug use: Never  . Sexual activity: Not Currently  Lifestyle  . Physical activity:    Days per week: Not on file    Minutes per session: Not on file  . Stress: Not on file  Relationships  . Social connections:    Talks on phone: Not on file    Gets together: Not on file    Attends religious service: Not on file    Active member of club or organization: Not on  file    Attends meetings of clubs or organizations: Not on file    Relationship status: Not on file  Other Topics Concern  . Not on file  Social History Narrative  . Not on file   Additional Social History:                         Sleep: Good  Appetite:  Good  Current Medications: Current Facility-Administered Medications  Medication Dose Route Frequency Provider Last Rate Last Dose  . acetaminophen (TYLENOL) tablet 650 mg  650 mg Oral Q6H PRN Clapacs, John T, MD      . alum & mag hydroxide-simeth (MAALOX/MYLANTA) 200-200-20 MG/5ML suspension 30 mL  30 mL Oral Q4H PRN Clapacs, John T, MD      . benztropine mesylate (COGENTIN) injection 1 mg  1 mg Intramuscular Q6H PRN He, Jun, MD   1 mg at 11/16/17 2153  . haloperidol lactate (HALDOL) injection 5 mg  5 mg Intramuscular Q6H PRN He, Jun, MD   5 mg at 11/16/17 2152  . hydrOXYzine (ATARAX/VISTARIL) tablet 50 mg  50 mg Oral Q4H PRN Clapacs, John T, MD      . LORazepam (ATIVAN) injection 1 mg  1 mg Intramuscular Q6H PRN He, Jun, MD   1 mg at 11/16/17 2152  . magnesium hydroxide (MILK OF MAGNESIA) suspension 30 mL  30 mL Oral Daily PRN Clapacs, John T, MD      . QUEtiapine (SEROQUEL) tablet 300 mg  300 mg Oral BID Haskell Riling, MD   300 mg at 11/18/17 2125  . traZODone (DESYREL) tablet 100 mg  100 mg Oral QHS PRN He, Jun, MD   100 mg at 11/18/17 2125    Lab Results: No results found for this or any previous visit (from the past 48 hour(s)).  Blood Alcohol level:  Lab Results  Component Value Date   ETH <10 11/04/2017    Metabolic Disorder Labs: No results found for: HGBA1C, MPG No results found for: PROLACTIN No results found for: CHOL, TRIG, HDL, CHOLHDL, VLDL, LDLCALC  Physical Findings: AIMS:  , ,  ,  ,    CIWA:    COWS:     Musculoskeletal: Strength & Muscle Tone: within normal limits Gait & Station: normal Patient leans: N/A  Psychiatric Specialty Exam: Physical Exam  Nursing note and vitals reviewed.    Review of Systems  All other systems reviewed and are negative.   Blood pressure 111/79, pulse (!) 105, temperature 98.5 F (36.9 C), temperature source Oral, resp. rate 18, height 5\' 8"  (1.727 m), weight 80.7 kg, SpO2 100 %.Body mass index is 27.06 kg/m.  General  Appearance: Casual  Eye Contact:  Fair  Speech:  Clear and Coherent  Volume:  Normal  Mood:  Irritable  Affect:  Labile  Thought Process:  Coherent and Goal Directed  Orientation:  Full (Time, Place, and Person)  Thought Content:  Logical  Suicidal Thoughts:  No  Homicidal Thoughts:  No  Memory:  Immediate;   Poor  Judgement:  Impaired  Insight:  Lacking  Psychomotor Activity:  Increased  Concentration:  Concentration: Poor  Recall:  Poor  Fund of Knowledge:  Poor  Language:  Fair  Akathisia:  No      Assets:  Resilience  ADL's:  Intact  Cognition:  Impaired,  Moderate  Sleep:  Number of Hours: 6.75     Treatment Plan Summary: 47 yo female with alcohol induced persistent dementia admitted due to severe mood instability. Pts mood have been labile and she has outburst through the day but only in the context of not getting what she wants or hearing news she doesn't want to hear. In between this, she is extremely calm and very appropriate with other peers. She has not been violent on the unit. 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 Medications can help stabile this to an extent but overall behavioral management is key. She will likely discharge back to memory care unit this week. She won't likely benefit from lengthy hospitalization as she does get more agitated the longer she is hospitalized as she "feels like I am in prison." She recently had very lengthy hospitalization due to placement issues and did exhibit very similar outbursts. She was able to have a  very rational and appropriate conversation with me shortly after the outburst in which she apologized for her behaviors. She was very organized in thoughts.    Plan:  Continue Seroquel 300 mg BID -Consider Tegretol for agitation. Spoke with guardian and sent letter requesting to start this medication.   Alcohol induced dementia -Discharge back to memory care unit  Dispo -Discharge back to [facility] center this week. I spoke to guardian yesterday regarding this. She states that she prefers pt to be hospitalized long term. We discussed that this is an acute care hospital and would not likely benefit from long term hospitalization.   Haskell Riling, MD 11/19/2017, 12:45 PM

## 2017-11-19 NOTE — Plan of Care (Signed)
  Problem: Activity: Goal: Will identify at least one activity in which they can participate Outcome: Not Progressing   Problem: Coping: Goal: Ability to identify and develop effective coping behavior will improve Outcome: Not Progressing Goal: Ability to interact with others will improve Outcome: Not Progressing Goal: Demonstration of participation in decision-making regarding own care will improve Outcome: Not Progressing Goal: Ability to use eye contact when communicating with others will improve Outcome: Not Progressing   Problem: Health Behavior/Discharge Planning: Goal: Identification of resources available to assist in meeting health care needs will improve Outcome: Not Progressing   Problem: Self-Concept: Goal: Will verbalize positive feelings about self Outcome: Not Progressing   Problem: Safety: Goal: Ability to remain free from injury will improve Outcome: Not Progressing

## 2017-11-19 NOTE — Progress Notes (Signed)
D- Patient alert and oriented. Patient presents in an irritable mood on assessment. Patient allowed this writer to take a set of vitals on her, however, she refused her medication this morning. Patient denies SI, HI, AVH, and pain at this time. Patient rates her depression and anxiety a "6/10" on her self-inventory, but did not express any of these feelings to this Clinical research associate. Patient's goal for today, per her self-inventory, is "getting freed from this institution", in which she will "calm down" in order to achieve this goal.  A- Support and encouragement provided. Routine safety checks conducted every 15 minutes.  Patient informed to notify staff with problems or concerns.  R- Patient contracts for safety at this time. Patient compliant with treatment plan. Patient interacts well with others on the unit. Patient remains safe at this time.

## 2017-11-19 NOTE — Progress Notes (Signed)
Patient states to this writer "why the fuck do you care how I slept last night?" Patient also refused her Seroquel. MD notified.

## 2017-11-19 NOTE — Progress Notes (Signed)
Patient ID: Joanne Johnston, female   DOB: 11/29/1970, 47 y.o.   MRN: [id number] CSW called [facility] to inform them of Pt's planned discharge for Friday 9/27.  Speaking with staff Victorino Dike who says they will not be allowing her to return as they cannot "meet her needs"  CSW inquired if they had provided a 30 day notice, she said they had not, but had notified the guardian appropriately.  CSW spoke with Guardian who says she did not receive such notification and asked Guardian to follow up as they did not give 30 day notice.  She agreed to.  Missed Admins call, will ask Huey Romans to follow up with [facility] at [phone].  Jake Shark, LCSW

## 2017-11-20 MED ORDER — CARBAMAZEPINE 200 MG PO TABS
200.0000 mg | ORAL_TABLET | Freq: Two times a day (BID) | ORAL | Status: DC
  Administered 2017-11-20 – 2017-12-20 (×53): 200 mg via ORAL
  Filled 2017-11-20 (×61): qty 1

## 2017-11-20 MED ORDER — QUETIAPINE FUMARATE 100 MG PO TABS
300.0000 mg | ORAL_TABLET | Freq: Two times a day (BID) | ORAL | Status: DC
  Administered 2017-11-20: 300 mg via ORAL
  Filled 2017-11-20 (×2): qty 3

## 2017-11-20 NOTE — Progress Notes (Signed)
Patient ID: Ivee Poellnitz, female   DOB: [birth year], 47 y.o.   MRN: [id number] CSW spoke with pt's guardian, Effie Shy with [employer].  Ms Farrior informed CSW that she along with her supervisor spoke with the Director of [facility], Curlene Labrum, about the Administrator of the facility refusal to allow pt to be re-admitted into the facility. Ms. Zadie Cleverly shared that they reminded Mr. Thomasena Edis of his legal obligations which includes giving DSS a 30 day notice and providing assistance with placement in another facility.   Ms. Zadie Cleverly shared that Mr. Thomasena Edis informed her and her supervisor that he was not aware that someone at the facility informed her that pt could not come back to the facility.  Ms. Zadie Cleverly informed CSW that the SW for [facility] has gotten involved in the case and informed her that she would like to speak with CSW about other placement options.  CSW was given the SW's name and contact information Julien Nordmann at [phone]).  CSW will f/u with SW at [facility].

## 2017-11-20 NOTE — Progress Notes (Signed)
Pt refused AM VS. RN will continue to monitor 

## 2017-11-20 NOTE — [facility] Group Notes (Signed)
LCSW Group Therapy Note  11/20/2017 1:00 pm  Type of Therapy/Topic:  Group Therapy:  Balance in Life  Participation Level:  Did Not Attend  Description of Group:    This group will address the concept of balance and how it feels and looks when one is unbalanced. Patients will be encouraged to process areas in their lives that are out of balance and identify reasons for remaining unbalanced. Facilitators will guide patients in utilizing problem-solving interventions to address and correct the stressor making their life unbalanced. Understanding and applying boundaries will be explored and addressed for obtaining and maintaining a balanced life. Patients will be encouraged to explore ways to assertively make their unbalanced needs known to significant others in their lives, using other group members and facilitator for support and feedback.  Therapeutic Goals: 1. Patient will identify two or more emotions or situations they have that consume much of in their lives. 2. Patient will identify signs/triggers that life has become out of balance:  3. Patient will identify two ways to set boundaries in order to achieve balance in their lives:  4. Patient will demonstrate ability to communicate their needs through discussion and/or role plays  Summary of Patient Progress:      Therapeutic Modalities:   Cognitive Behavioral Therapy Solution-Focused Therapy Assertiveness Training  Alease Frame, LCSW 11/20/2017 3:13 PM

## 2017-11-20 NOTE — Progress Notes (Signed)
[facility] MD Progress Note  11/20/2017 12:43 PM Joanne Johnston  MRN:  [id number] Subjective:  Per RN staff, pt had behavioral outburst again last night and refused her medications. She was yelling at nursing staff and banging on the day room door. She was not aggressive towards anyone. Upon evaluation today, she is very calm and appropriate. She states that she remembers talking to me yesterday. Reviewed the things that she needs to do to be able to be discharged. She states, "I know I need to behave myself." She then agreed to take her medications after encouragement. She asked appropriate questions about her medications including what Tegretol was for. She was able to have a normal rational conversation during our conversation. She did sleep well last night. She denies SI, HI, Ah, VH.    I spoke with Merlyn Albert, the administrator with [facility]. Discussed with him that she is at her baseline and ready to discharge back to their facility. HE stated that he is refusing to accept her back and that they had already filled her bed. He states, "I do not want to subject my staff to her behaviors. She needs long term." Discussed that he had not given an adequate 30 day notice to her guardian. HE stated that he has tried several time to "call her social worker" and could not get through. Discussed that the appropriate person to have contacted was her guardian and she reported that she had not been notified. He stated that he would not accept her back in any circumstances.   I left message for her guardian to return my call to discuss next steps for placement.   Principal Problem: Unspecified mood (affective) disorder (HCC) Diagnosis:   Patient Active Problem List   Diagnosis Date Noted  . Unspecified mood (affective) disorder (HCC) [F39] 11/18/2017    Priority: High  . 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 Agitation [R45.1] 11/04/2017   Total Time spent with patient: 30  minutes  Past Psychiatric History: Se H&P  Past Medical History:  Past Medical History:  Diagnosis Date  . Alcohol dependence (HCC)   . Anxiety   . Dementia   . Depression   . Hypertension   . Tachycardia     Past Surgical History:  Procedure Laterality Date  . CESAREAN SECTION     x2   Family History: History reviewed. No pertinent family history. Family Psychiatric  History: See H&P  Social History:  Social History   Substance and Sexual Activity  Alcohol Use Not Currently  . Frequency: Never   Comment: states hasn't had a drink in months     Social History   Substance and Sexual Activity  Drug Use Never    Social History   Socioeconomic History  . Marital status: Divorced    Spouse name: Not on file  . Number of children: Not on file  . Years of education: Not on file  . Highest education level: Not on file  Occupational History  . Not on file  Social Needs  . Financial resource strain: Not on file  . Food insecurity:    Worry: Not on file    Inability: Not on file  . Transportation needs:    Medical: Not on file    Non-medical: Not on file  Tobacco Use  . Smoking status: Never Smoker  . Smokeless tobacco: Never Used  Substance and Sexual Activity  . Alcohol use: Not Currently    Frequency:  Never    Comment: states hasn't had a drink in months  . Drug use: Never  . Sexual activity: Not Currently  Lifestyle  . Physical activity:    Days per week: Not on file    Minutes per session: Not on file  . Stress: Not on file  Relationships  . Social connections:    Talks on phone: Not on file    Gets together: Not on file    Attends religious service: Not on file    Active member of club or organization: Not on file    Attends meetings of clubs or organizations: Not on file    Relationship status: Not on file  Other Topics Concern  . Not on file  Social History Narrative  . Not on file   Additional Social History:                          Sleep: Fair  Appetite:  Good  Current Medications: Current Facility-Administered Medications  Medication Dose Route Frequency Provider Last Rate Last Dose  . acetaminophen (TYLENOL) tablet 650 mg  650 mg Oral Q6H PRN Clapacs, John T, MD      . alum & mag hydroxide-simeth (MAALOX/MYLANTA) 200-200-20 MG/5ML suspension 30 mL  30 mL Oral Q4H PRN Clapacs, John T, MD      . benztropine mesylate (COGENTIN) injection 1 mg  1 mg Intramuscular Q6H PRN He, Jun, MD   1 mg at 11/16/17 2153  . carbamazepine (TEGRETOL) tablet 200 mg  200 mg Oral BID Yahmir Sokolov R, MD   200 mg at 11/20/17 0859  . haloperidol lactate (HALDOL) injection 5 mg  5 mg Intramuscular Q6H PRN He, Jun, MD   5 mg at 11/16/17 2152  . hydrOXYzine (ATARAX/VISTARIL) tablet 50 mg  50 mg Oral Q4H PRN Clapacs, Jackquline Denmark, MD      . LORazepam (ATIVAN) injection 1 mg  1 mg Intramuscular Q6H PRN He, Jun, MD   1 mg at 11/16/17 2152  . magnesium hydroxide (MILK OF MAGNESIA) suspension 30 mL  30 mL Oral Daily PRN Clapacs, John T, MD      . QUEtiapine (SEROQUEL) tablet 300 mg  300 mg Oral BID Haskell Riling, MD   300 mg at 11/20/17 0859  . traZODone (DESYREL) tablet 100 mg  100 mg Oral QHS PRN He, Jun, MD   100 mg at 11/18/17 2125    Lab Results: No results found for this or any previous visit (from the past 48 hour(s)).  Blood Alcohol level:  Lab Results  Component Value Date   ETH <10 11/04/2017    Metabolic Disorder Labs: No results found for: HGBA1C, MPG No results found for: PROLACTIN No results found for: CHOL, TRIG, HDL, CHOLHDL, VLDL, LDLCALC  Physical Findings: AIMS: Facial and Oral Movements Muscles of Facial Expression: None, normal Lips and Perioral Area: None, normal Jaw: None, normal Tongue: None, normal,Extremity Movements Upper (arms, wrists, hands, fingers): None, normal Lower (legs, knees, ankles, toes): None, normal, Trunk Movements Neck, shoulders, hips: None, normal, Overall Severity Severity of abnormal  movements (highest score from questions above): None, normal Incapacitation due to abnormal movements: None, normal Patient's awareness of abnormal movements (rate only patient's report): No Awareness, Dental Status Current problems with teeth and/or dentures?: No Does patient usually wear dentures?: No  CIWA:    COWS:     Musculoskeletal: Strength & Muscle Tone: within normal limits Gait & Station: normal Patient leans:  N/A  Psychiatric Specialty Exam: Physical Exam  Nursing note and vitals reviewed.   Review of Systems  All other systems reviewed and are negative.   Blood pressure 107/76, pulse 93, temperature 99 F (37.2 C), temperature source Oral, resp. rate 16, height 5\' 8"  (1.727 m), weight 80.7 kg, SpO2 98 %.Body mass index is 27.06 kg/m.  General Appearance: Casual  Eye Contact:  Good  Speech:  Clear and Coherent  Volume:  Normal  Mood:  Irritable  Affect:  Labile  Thought Process:  Coherent and Goal Directed  Orientation:  Full (Time, Place, and Person)  Thought Content:  Logical  Suicidal Thoughts:  No  Homicidal Thoughts:  No  Memory:  Immediate;   Poor  Judgement:  Impaired  Insight:  Lacking  Psychomotor Activity:  Normal  Concentration:  Concentration: Poor  Recall:  Poor  Fund of Knowledge:  Fair  Language:  Fair  Akathisia:  No      Assets:  Resilience  ADL's:  Intact  Cognition:  Impaired,  Moderate  Sleep:  Number of Hours: 7.75     Treatment Plan Summary: 47 yo female admitted due to agitation at memory care unit. She has diagnosis of alcohol induced persistent dementia. She has verbal outbursts when she feels like people are trying to control her. She has not been physically aggressive at all. In between these episodes, she is very calm and polite. She does not appear manic or psychotic and these behavioral issue seem to stem from Alcohol induced dementia which can greatly affect frontal lobe. She has been residing at a locked memory care unit  but they are refusing to take her back due to her outbursts. She does have significant short term memory issues but is able to care for her ADLs and appears to be adequately taking care of herself while on the unit. I don't feel that she is so gravely disabled that she needs a 24/7 locked memory unit.  I am not sure that she necessarily requires a locked memory care unit as most of her outbursts are due to feeling is a prisoner. She is able to have a rational conversation in between otuburusts. I left message for guardian to discuss placement options.   Plan:  Mood -Continue Seroquel 300 mg BID -Start Tegretol 200 mg BID. Permission granted by guardian  Alcohol induced dementia -will need new placement  Dispo -Will need new placement in supervised living  Haskell Riling, MD 11/20/2017, 12:43 PM

## 2017-11-20 NOTE — Progress Notes (Signed)
Pt put clothes basket in front of her door during rounds and screamed at staff "get out". RN will continue to monitor

## 2017-11-20 NOTE — Progress Notes (Signed)
Nursing note 7p-7a  Pt observed interacting with peers on unit this shift. Displayed a irritable affect and agitated mood upon interaction with this Clinical research associate. Pt refuses to speak or make eye contact during assessment. Ignoring questions and walking away.  Pt is verbally aggressive towards charge RN and this RN this shift. "Fuck you, you bitches" Pt refused to take HS medications after several attempts. Would not state the reason why. Pt refused to take prn medication when offered.  Pt is now resting in bed with eyes closed, with no signs or symptoms of pain or distress noted. Pt continues to remain safe on the unit and is observed by rounding every 15 min. RN will continue to monitor.

## 2017-11-20 NOTE — Progress Notes (Signed)
Recreation Therapy Notes  Date: 11/20/2017  Time: 9:30 am   Location: Craft room   Behavioral response: N/A   Intervention Topic: Communication  Discussion/Intervention: Patient did not attend group.   Clinical Observations/Feedback:  Patient did not attend group.   Shayonna Ocampo LRT/CTRS        Keiden Deskin 11/20/2017 11:00 AM

## 2017-11-20 NOTE — Plan of Care (Signed)
Patient alert and oriented; irritable; avertive eye contact. Patient initially refused medications stating " What about my blood pressure medications, I am not taking anything then."Psychiatrist encouragement patient changed her mind and took morning medications. Patient denies SI, HI and AVH.  Problem: Activity: Goal: Will identify at least one activity in which they can participate Outcome: Not Progressing   Problem: Coping: Goal: Ability to identify and develop effective coping behavior will improve Outcome: Not Progressing Goal: Ability to interact with others will improve Outcome: Not Progressing Goal: Demonstration of participation in decision-making regarding own care will improve Outcome: Progressing Goal: Ability to use eye contact when communicating with others will improve Outcome: Not Progressing   Problem: Health Behavior/Discharge Planning: Goal: Identification of resources available to assist in meeting health care needs will improve Outcome: Progressing   Problem: Safety: Goal: Ability to remain free from injury will improve Outcome: Progressing

## 2017-11-21 MED ORDER — QUETIAPINE FUMARATE 200 MG PO TABS
300.0000 mg | ORAL_TABLET | Freq: Every day | ORAL | Status: DC
  Administered 2017-11-22 – 2017-12-17 (×24): 300 mg via ORAL
  Filled 2017-11-21 (×24): qty 1

## 2017-11-21 NOTE — Plan of Care (Addendum)
Patient found in day room upon my arrival. Patient is visible until 8pm and minimally social with some peers. Selectively mute or minimally verbal with staff. Patient resistant to care, refusing HS medications. Patient is in bed since 2000. No interaction with staff except to say "no" to medications. Patient would not answer assessment questions. Q 15 minute checks maintained. Will continue to monitor throughout the shift. @0115 , patient began screaming and verbally abusing staff. Patient was warned to keep her voice down and would settle down for 5-10 minutes at a time but then emerged from her room @0200  and started screaming about breakfast. Patient was told to return to her room and she was given Ativan 1mg , Haldol 5mg , and Cogentin 1mg  IM. Patient continues to scream at this time (0215). Will continue to monitor for efficacy. Patient slept 4.5 hours. Refused am VS. Will endorse care to oncoming shift.  Problem: Coping: Goal: Ability to use eye contact when communicating with others will improve Outcome: Progressing   Problem: Coping: Goal: Ability to identify and develop effective coping behavior will improve Outcome: Not Progressing Goal: Ability to interact with others will improve Outcome: Not Progressing Goal: Demonstration of participation in decision-making regarding own care will improve Outcome: Not Progressing   Problem: Self-Concept: Goal: Will verbalize positive feelings about self Outcome: Not Progressing

## 2017-11-21 NOTE — Progress Notes (Signed)
Recreation Therapy Notes  Date: 11/21/2017  Time: 9:30 am   Location: Craft room   Behavioral response: N/A   Intervention Topic: Stress  Discussion/Intervention: Patient did not attend group.   Clinical Observations/Feedback:  Patient did not attend group.   Antwuan Eckley LRT/CTRS         Rochella Benner 11/21/2017 10:27 AM 

## 2017-11-21 NOTE — Tx Team (Signed)
Interdisciplinary Treatment and Diagnostic Plan Update  11/21/2017 Time of Session: 11:00 am Joanne Johnston MRN: [id number]  Principal Diagnosis: Unspecified mood (affective) disorder (HCC)  Secondary Diagnoses: Principal Problem:   Unspecified mood (affective) disorder (HCC) Active Problems:   Alcohol-induced persisting dementia (HCC)   Current Medications:  Current Facility-Administered Medications  Medication Dose Route Frequency Provider Last Rate Last Dose  . acetaminophen (TYLENOL) tablet 650 mg  650 mg Oral Q6H PRN Clapacs, John T, MD      . alum & mag hydroxide-simeth (MAALOX/MYLANTA) 200-200-20 MG/5ML suspension 30 mL  30 mL Oral Q4H PRN Clapacs, John T, MD      . benztropine mesylate (COGENTIN) injection 1 mg  1 mg Intramuscular Q6H PRN He, Jun, MD   1 mg at 11/20/17 2100  . carbamazepine (TEGRETOL) tablet 200 mg  200 mg Oral BID McNew, Holly R, MD   200 mg at 11/21/17 4098  . haloperidol lactate (HALDOL) injection 5 mg  5 mg Intramuscular Q6H PRN He, Jun, MD   5 mg at 11/20/17 2137  . hydrOXYzine (ATARAX/VISTARIL) tablet 50 mg  50 mg Oral Q4H PRN Clapacs, Jackquline Denmark, MD      . LORazepam (ATIVAN) injection 1 mg  1 mg Intramuscular Q6H PRN He, Jun, MD   1 mg at 11/20/17 2000  . magnesium hydroxide (MILK OF MAGNESIA) suspension 30 mL  30 mL Oral Daily PRN Clapacs, Jackquline Denmark, MD      . Melene Muller ON 11/22/2017] QUEtiapine (SEROQUEL) tablet 300 mg  300 mg Oral QHS McNew, Ileene Hutchinson, MD      . traZODone (DESYREL) tablet 100 mg  100 mg Oral QHS PRN He, Jun, MD   100 mg at 11/18/17 2125   PTA Medications: Medications Prior to Admission  Medication Sig Dispense Refill Last Dose  . Multiple Vitamin (MULTI-VITAMINS) TABS Take 1 tablet by mouth daily.    unknown at unknown  . QUEtiapine (SEROQUEL) 25 MG tablet Take 25 mg by mouth daily.    unknown at unknown  . QUEtiapine (SEROQUEL) 50 MG tablet Take 50 mg by mouth at bedtime.    unknown at unknown  . topiramate (TOPAMAX) 50 MG tablet Take 50 mg by  mouth daily.    unknwon at unknown    Patient Stressors: Other: People, buildings, rooms  Patient Strengths: Capable of independent living Physical Health Supportive family/friends  Treatment Modalities: Medication Management, Group therapy, Case management,  1 to 1 session with clinician, Psychoeducation, Recreational therapy.   Physician Treatment Plan for Primary Diagnosis: Unspecified mood (affective) disorder (HCC) Long Term Goal(s): Improvement in symptoms so as ready for discharge Improvement in symptoms so as ready for discharge   Short Term Goals: Ability to identify changes in lifestyle to reduce recurrence of condition will improve Ability to identify changes in lifestyle to reduce recurrence of condition will improve  Medication Management: Evaluate patient's response, side effects, and tolerance of medication regimen.  Therapeutic Interventions: 1 to 1 sessions, Unit Group sessions and Medication administration.  Evaluation of Outcomes: Progressing  Physician Treatment Plan for Secondary Diagnosis: Principal Problem:   Unspecified mood (affective) disorder (HCC) Active Problems:   Alcohol-induced persisting dementia (HCC)  Long Term Goal(s): Improvement in symptoms so as ready for discharge Improvement in symptoms so as ready for discharge   Short Term Goals: Ability to identify changes in lifestyle to reduce recurrence of condition will improve Ability to identify changes in lifestyle to reduce recurrence of condition will improve     Medication  Management: Evaluate patient's response, side effects, and tolerance of medication regimen.  Therapeutic Interventions: 1 to 1 sessions, Unit Group sessions and Medication administration.  Evaluation of Outcomes: Progressing   RN Treatment Plan for Primary Diagnosis: Unspecified mood (affective) disorder (HCC) Long Term Goal(s): Knowledge of disease and therapeutic regimen to maintain health will improve  Short  Term Goals: Ability to identify and develop effective coping behaviors will improve and Compliance with prescribed medications will improve  Medication Management: RN will administer medications as ordered by provider, will assess and evaluate patient's response and provide education to patient for prescribed medication. RN will report any adverse and/or side effects to prescribing provider.  Therapeutic Interventions: 1 on 1 counseling sessions, Psychoeducation, Medication administration, Evaluate responses to treatment, Monitor vital signs and CBGs as ordered, Perform/monitor CIWA, COWS, AIMS and Fall Risk screenings as ordered, Perform wound care treatments as ordered.  Evaluation of Outcomes: Progressing   LCSW Treatment Plan for Primary Diagnosis: Unspecified mood (affective) disorder (HCC) Long Term Goal(s): Safe transition to appropriate next level of care at discharge, Engage patient in therapeutic group addressing interpersonal concerns.  Short Term Goals: Engage patient in aftercare planning with referrals and resources, Identify triggers associated with mental health/substance abuse issues and Increase skills for wellness and recovery  Therapeutic Interventions: Assess for all discharge needs, 1 to 1 time with Social worker, Explore available resources and support systems, Assess for adequacy in community support network, Educate family and significant other(s) on suicide prevention, Complete Psychosocial Assessment, Interpersonal group therapy.  Evaluation of Outcomes: Progressing   Progress in Treatment: Attending groups: No. Participating in groups: No. Taking medication as prescribed: Yes. Toleration medication: Yes. Family/Significant other contact made: Yes, individual(s) contacted:  CSW has made contact with pt's guardian, Effie Shy with [employer]. Patient understands diagnosis: No. Discussing patient identified problems/goals with staff: Yes. Medical problems  stabilized or resolved: Yes. Denies suicidal/homicidal ideation: Yes. Issues/concerns per patient self-inventory: No. Other:    New problem(s) identified: No, Describe:     New Short Term/Long Term Goal(s):  Patient Goals:  To be discharged  Discharge Plan or Barriers: TBD.  Pt has been discharged from the memory care unit at [facility].  CSW along with her guardian are exploring other residence options at this time.  Reason for Continuation of Hospitalization: Aggression Anxiety Medication stabilization  Estimated Length of Stay: 3-5 days  Recreational Therapy: Patient Stressors: N/A Patient Goal: Patient will engage in interactions with peers and staff in pro-social manner at least 2x within 5 recreation therapy group sessions  Attendees: Patient: 11/21/2017 12:34 PM  Physician: Corinna Gab, MD 11/21/2017 12:34 PM  Nursing: Milas Hock, RN 11/21/2017 12:34 PM  RN Care Manager: 11/21/2017 12:34 PM  Social Worker: Huey Romans, LCSW 11/21/2017 12:34 PM  Recreational Therapist: Garret Reddish, LRT 11/21/2017 12:34 PM  Other: Joneen Roach, LCSWA 11/21/2017 12:34 PM  Other:  11/21/2017 12:34 PM  Other: 11/21/2017 12:34 PM    Scribe for Treatment Team: Alease Frame, LCSW 11/21/2017 12:34 PM

## 2017-11-21 NOTE — Plan of Care (Signed)
Patient is alert and oriented X 3, denies SI, HI and AVH. Patient denies pain 0/10. Patient is can be labile. Patient has no complaints at this time only worrying about placement. No self injurious behaviors noted. Problem: Activity: Goal: Will identify at least one activity in which they can participate Outcome: Progressing   Problem: Coping: Goal: Ability to identify and develop effective coping behavior will improve Outcome: Progressing Goal: Ability to interact with others will improve Outcome: Not Progressing Goal: Demonstration of participation in decision-making regarding own care will improve Outcome: Progressing Goal: Ability to use eye contact when communicating with others will improve Outcome: Progressing   Problem: Health Behavior/Discharge Planning: Goal: Identification of resources available to assist in meeting health care needs will improve Outcome: Progressing

## 2017-11-21 NOTE — Progress Notes (Signed)
[facility] MD Progress Note  11/21/2017 12:12 PM Joanne Johnston  MRN:  [id number] Subjective:  Per staff, she did have verbal outburst last night which is very typical for her. She did take medications last night. She was calm today with this provider. We spent a lot of time together and had a great conversation. She has actually pretty good insight when you talk with her. She took the news about not being accepted back to [facility] well. She states that she did not like it there. She does not like being locked up and did not feel it is an appropriate level of care especially with her age. She explains that she knows she has some memory issues and is willing to have support but would prefer somewhere that isn't locked and can have some independence.  We discussed her memory deficits. She states that she has trouble with short term things. She states, "my long term memory is great." She sat down with me and we did MOCA together. She scored 21/30 but this score could have been higher as she was not familiar with this hospital as she was brought here by ambulance. The main deficit was in delayed recall. She did really well on the other sections including visuospatial and executive functioning. She states that she appreciates the effort to help her gain more independence. She also talked about her medications. She states that she is not against medications but doesn't want to feel tired through the day. We discussed that we can adjust Seroquel so she only takes it at night. She is very agreeable tot his. She did agree to take the Tegretol this morning with encouragement. She was bright in affect, smiling. She states that she prefers to live somewhere either near her daughters in [place] or at least near some family in [place]. She states that it was hard to be in [place] with no family around at all. She has consistently denied SI or thoughts of self harm. She denies having any cravings for alcohol.   Principal  Problem: Unspecified mood (affective) disorder (HCC) Diagnosis:   Patient Active Problem List   Diagnosis Date Noted  . Unspecified mood (affective) disorder (HCC) [F39] 11/18/2017    Priority: High  . 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 Agitation [R45.1] 11/04/2017   Total Time spent with patient: 30 minutes  Past Psychiatric History: See H&P  Past Medical History:  Past Medical History:  Diagnosis Date  . Alcohol dependence (HCC)   . Anxiety   . Dementia   . Depression   . Hypertension   . Tachycardia     Past Surgical History:  Procedure Laterality Date  . CESAREAN SECTION     x2   Family History: History reviewed. No pertinent family history. Family Psychiatric  History: See H&P Social History:  Social History   Substance and Sexual Activity  Alcohol Use Not Currently  . Frequency: Never   Comment: states hasn't had a drink in months     Social History   Substance and Sexual Activity  Drug Use Never    Social History   Socioeconomic History  . Marital status: Divorced    Spouse name: Not on file  . Number of children: Not on file  . Years of education: Not on file  . Highest education level: Not on file  Occupational History  . Not on file  Social Needs  . Financial resource strain: Not on file  .  Food insecurity:    Worry: Not on file    Inability: Not on file  . Transportation needs:    Medical: Not on file    Non-medical: Not on file  Tobacco Use  . Smoking status: Never Smoker  . Smokeless tobacco: Never Used  Substance and Sexual Activity  . Alcohol use: Not Currently    Frequency: Never    Comment: states hasn't had a drink in months  . Drug use: Never  . Sexual activity: Not Currently  Lifestyle  . Physical activity:    Days per week: Not on file    Minutes per session: Not on file  . Stress: Not on file  Relationships  . Social connections:    Talks on phone: Not on file    Gets  together: Not on file    Attends religious service: Not on file    Active member of club or organization: Not on file    Attends meetings of clubs or organizations: Not on file    Relationship status: Not on file  Other Topics Concern  . Not on file  Social History Narrative  . Not on file   Additional Social History:                         Sleep: Good  Appetite:  Good  Current Medications: Current Facility-Administered Medications  Medication Dose Route Frequency Provider Last Rate Last Dose  . acetaminophen (TYLENOL) tablet 650 mg  650 mg Oral Q6H PRN Clapacs, John T, MD      . alum & mag hydroxide-simeth (MAALOX/MYLANTA) 200-200-20 MG/5ML suspension 30 mL  30 mL Oral Q4H PRN Clapacs, John T, MD      . benztropine mesylate (COGENTIN) injection 1 mg  1 mg Intramuscular Q6H PRN He, Jun, MD   1 mg at 11/20/17 2100  . carbamazepine (TEGRETOL) tablet 200 mg  200 mg Oral BID Verbon Giangregorio R, MD   200 mg at 11/21/17 2956  . haloperidol lactate (HALDOL) injection 5 mg  5 mg Intramuscular Q6H PRN He, Jun, MD   5 mg at 11/20/17 2137  . hydrOXYzine (ATARAX/VISTARIL) tablet 50 mg  50 mg Oral Q4H PRN Clapacs, Jackquline Denmark, MD      . LORazepam (ATIVAN) injection 1 mg  1 mg Intramuscular Q6H PRN He, Jun, MD   1 mg at 11/20/17 2000  . magnesium hydroxide (MILK OF MAGNESIA) suspension 30 mL  30 mL Oral Daily PRN Clapacs, Jackquline Denmark, MD      . Melene Muller ON 11/22/2017] QUEtiapine (SEROQUEL) tablet 300 mg  300 mg Oral QHS Sharia Averitt, Ileene Hutchinson, MD      . traZODone (DESYREL) tablet 100 mg  100 mg Oral QHS PRN He, Jun, MD   100 mg at 11/18/17 2125    Lab Results: No results found for this or any previous visit (from the past 48 hour(s)).  Blood Alcohol level:  Lab Results  Component Value Date   ETH <10 11/04/2017    Metabolic Disorder Labs: No results found for: HGBA1C, MPG No results found for: PROLACTIN No results found for: CHOL, TRIG, HDL, CHOLHDL, VLDL, LDLCALC  Physical Findings: AIMS: Facial  and Oral Movements Muscles of Facial Expression: None, normal Lips and Perioral Area: None, normal Jaw: None, normal Tongue: None, normal,Extremity Movements Upper (arms, wrists, hands, fingers): None, normal Lower (legs, knees, ankles, toes): None, normal, Trunk Movements Neck, shoulders, hips: None, normal, Overall Severity Severity of abnormal movements (  highest score from questions above): None, normal Incapacitation due to abnormal movements: None, normal Patient's awareness of abnormal movements (rate only patient's report): No Awareness, Dental Status Current problems with teeth and/or dentures?: No Does patient usually wear dentures?: No  CIWA:    COWS:     Musculoskeletal: Strength & Muscle Tone: within normal limits Gait & Station: normal Patient leans: N/A  Psychiatric Specialty Exam: Physical Exam  Nursing note and vitals reviewed.   Review of Systems  All other systems reviewed and are negative.   Blood pressure 107/76, pulse 93, temperature 99 F (37.2 C), temperature source Oral, resp. rate 16, height 5\' 8"  (1.727 m), weight 80.7 kg, SpO2 98 %.Body mass index is 27.06 kg/m.  General Appearance: Casual  Eye Contact:  Good  Speech:  Clear and Coherent  Volume:  Normal  Mood:  Euthymic currently  Affect:  Labile  Thought Process:  Coherent and Goal Directed  Orientation:  Full (Time, Place, and Person)  Thought Content:  Logical  Suicidal Thoughts:  No  Homicidal Thoughts:  No  Memory:  Immediate;   Poor, long term memory intact MOCA 21/30  Judgement:  Impaired  Insight:  Lacking  Psychomotor Activity:  Normal  Concentration:  Concentration: Fair  Recall:  [employer] of Knowledge:  Fair  Language:  Fair  Akathisia:  No      Assets:  Resilience  ADL's:  Intact  Cognition:  Impaired,  Moderate  Sleep:  Number of Hours: 7     Treatment Plan Summary: 47 yo female admitted due to agitation at memory care unit. They are not refusing to accept her  back. She does continue to have verbal outbursts at times. Usually occurring when she feels like she is being controlled or told what to do. She does very well and I have been able to have great conversations with her when she is treated like a human and really involve her in her care and help give her options. She is organized in thoughts. MOCA mostly showed deficits in delayed recall. She is caring well for her ADLs. She does acknowledge that she has outbursts and she relates this to being very frustrated with being locked up and difficulty with adjusting to lack of independence. The outbursts are likely related to damage to her frontal lobe from severe alcohol use. I strongly feel that she would benefit more from a more independent living environment with some level of supervision. Her behaviors declined by being locked up in a memory care unit with patients much older than her age. This has not worked in the past and not likely to be beneficial for her in the future. She would benefit from a much less restrictive level of care.  She is higher functioning and has been able to adequately care for herself on the unit.  It would be worth looking into a more assisted living type environment rather than locked dementia unit. I wouldn't classify her memory issues at "severe."  I left message with her guardian to discuss this recommendation.   Plan:  Mood -Will stop daytime Seroquel due to feeling tired. Will keep nighttime Seroquel at 300 mg qhs -Continue Tegretol 200 mg BID  Alcohol included dementia -In my opinion, mild-moderate degree of memory impairment -Will need new placement hopefully in less restrictive level of care this time  Dispo -Left message with guardian to discuss placement recommendations and options.   Haskell Riling, MD 11/21/2017, 12:12 PM

## 2017-11-21 NOTE — Progress Notes (Signed)
Patient alert and oriented x 3,  noted yelling in the room, disruptive in the milieu, attempted several times to redirect patient but she got worse, will not cooperate with  Writer, upon approach patient is verbally aggressive using profanities, threatening staff, very hostile , posturing, argumentative, yelling, banging the doors loudly and covering the openind on her door so staff can't  see her. Patient was given lorazepam and haldol injection with cogentin which was very effective and patient became less agitated and not hostile to staff. No distress noted, 15 minutes safety checks maintained, will continue to monitor

## 2017-11-21 NOTE — Plan of Care (Signed)
  Problem: Coping: Goal: Ability to identify and develop effective coping behavior will improve Outcome: Not Progressing  Patient is not coping or interacting appropriately with peers and staff.

## 2017-11-21 NOTE — [facility] Group Notes (Signed)

## 2017-11-22 DIAGNOSIS — F39 Unspecified mood [affective] disorder: Secondary | ICD-10-CM

## 2017-11-22 MED ORDER — BENZTROPINE MESYLATE 1 MG/ML IJ SOLN: 1.0000 mg | Freq: Four times a day (QID) | INTRAMUSCULAR | Status: DC | PRN

## 2017-11-22 MED ORDER — LORAZEPAM 1 MG PO TABS
1.0000 mg | ORAL_TABLET | Freq: Four times a day (QID) | ORAL | Status: DC | PRN
  Administered 2017-11-22 – 2017-12-29 (×4): 1 mg via ORAL
  Filled 2017-11-22 (×4): qty 1

## 2017-11-22 MED ORDER — HALOPERIDOL LACTATE 5 MG/ML IJ SOLN
5.0000 mg | Freq: Four times a day (QID) | INTRAMUSCULAR | Status: DC | PRN
  Administered 2017-12-18: 5 mg via INTRAMUSCULAR
  Filled 2017-11-22 (×2): qty 1

## 2017-11-22 MED ORDER — BENZTROPINE MESYLATE 1 MG PO TABS: 1.0000 mg | ORAL_TABLET | Freq: Four times a day (QID) | ORAL | Status: DC | PRN

## 2017-11-22 MED ORDER — VITAMIN B-1 100 MG PO TABS
100.0000 mg | ORAL_TABLET | Freq: Every day | ORAL | Status: DC
  Administered 2017-11-22 – 2018-01-02 (×36): 100 mg via ORAL
  Filled 2017-11-22 (×40): qty 1

## 2017-11-22 MED ORDER — LORAZEPAM 2 MG/ML IJ SOLN
1.0000 mg | Freq: Four times a day (QID) | INTRAMUSCULAR | Status: DC | PRN
  Administered 2017-12-18: 1 mg via INTRAMUSCULAR

## 2017-11-22 MED ORDER — HALOPERIDOL 5 MG PO TABS
5.0000 mg | ORAL_TABLET | Freq: Four times a day (QID) | ORAL | Status: DC | PRN
  Administered 2017-11-22 – 2017-12-29 (×2): 5 mg via ORAL
  Filled 2017-11-22 (×2): qty 1

## 2017-11-22 NOTE — Plan of Care (Signed)
Pt. Interaction with staff this evening a little improved during assessments. Pt. Was able to be directed and encouraged to come up to the medication room for her night time medications less agitated and aggressive then previous nights. Pt. Denies Si/Hi this evening during assessments.    Problem: Coping: Goal: Ability to interact with others will improve Outcome: Progressing   Problem: Safety: Goal: Ability to remain free from injury will improve Outcome: Progressing

## 2017-11-22 NOTE — [facility] Group Notes (Signed)
LCSW Group Therapy Note  11/22/2017 1:15pm  Type of Therapy and Topic:  Group Therapy:  Cognitive Distortions  Participation Level:  Did Not Attend   Description of Group:    Patients in this group will be introduced to the topic of cognitive distortions.  Patients will identify and describe cognitive distortions, describe the feelings these distortions create for them.  Patients will identify one or more situations in their personal life where they have cognitively distorted thinking and will verbalize challenging this cognitive distortion through positive thinking skills.  Patients will practice the skill of using positive affirmations to challenge cognitive distortions using affirmation cards.    Therapeutic Goals:  1. Patient will identify two or more cognitive distortions they have used 2. Patient will identify one or more emotions that stem from use of a cognitive distortion 3. Patient will demonstrate use of a positive affirmation to counter a cognitive distortion through discussion and/or role play. 4. Patient will describe one way cognitive distortions can be detrimental to wellness   Summary of Patient Progress: Pt was invited to attend group but chose not to attend. CSW will continue to encourage pt to attend group throughout their admission.      Therapeutic Modalities:   Cognitive Behavioral Therapy Motivational Interviewing   Joanne Koral  [name], LCSW 11/22/2017 12:35 PM   

## 2017-11-22 NOTE — Plan of Care (Signed)
Patient is alert and oriented, very loud and verbally abusive this morning; Yelling; cursing and slamming the door to her room as hard as possible, calling staff "Bitches." Patient initially refused morning medication but after speaking with psychiatrist patient agreed to take tegretol. Patient given PRN Ativan PO to help with agitation and aggression this morning 0826. Patient denies SI, HI and AVH. Problem: Activity: Goal: Will identify at least one activity in which they can participate Outcome: Progressing   Problem: Coping: Goal: Ability to identify and develop effective coping behavior will improve Outcome: Progressing Goal: Ability to interact with others will improve Outcome: Not Progressing Goal: Demonstration of participation in decision-making regarding own care will improve Outcome: Not Progressing Goal: Ability to use eye contact when communicating with others will improve Outcome: Progressing   Problem: Health Behavior/Discharge Planning: Goal: Identification of resources available to assist in meeting health care needs will improve Outcome: Not Progressing   Problem: Safety: Goal: Ability to remain free from injury will improve Outcome: Progressing   Problem: Self-Concept: Goal: Will verbalize positive feelings about self Outcome: Progressing   Patient will continue to be monitored.

## 2017-11-22 NOTE — Progress Notes (Signed)
[facility] MD Progress Note  11/22/2017 4:25 PM Jilian West  MRN:  [id number] Subjective:   What do you want?" Pt reportedly had outburst, yelling screaming, needed prn im haldol, ativan and cogentin , refused am tegretol. Pt anxious, labile, aggred to take tegretol after talking to me, not wnating to go her current facilty. Denies AVH.   Principal Problem: Unspecified mood (affective) disorder (HCC) Diagnosis:   Patient Active Problem List   Diagnosis Date Noted  . Unspecified mood (affective) disorder (HCC) [F39] 11/18/2017  . 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 Agitation [R45.1] 11/04/2017   Total Time spent with patient:  Past Psychiatric History: See H&P  Past Medical History:  Past Medical History:  Diagnosis Date  . Alcohol dependence (HCC)   . Anxiety   . Dementia   . Depression   . Hypertension   . Tachycardia     Past Surgical History:  Procedure Laterality Date  . CESAREAN SECTION     x2   Family History: History reviewed. No pertinent family history. Family Psychiatric  History: See H&P Social History:  Social History   Substance and Sexual Activity  Alcohol Use Not Currently  . Frequency: Never   Comment: states hasn't had a drink in months     Social History   Substance and Sexual Activity  Drug Use Never    Social History   Socioeconomic History  . Marital status: Divorced    Spouse name: Not on file  . Number of children: Not on file  . Years of education: Not on file  . Highest education level: Not on file  Occupational History  . Not on file  Social Needs  . Financial resource strain: Not on file  . Food insecurity:    Worry: Not on file    Inability: Not on file  . Transportation needs:    Medical: Not on file    Non-medical: Not on file  Tobacco Use  . Smoking status: Never Smoker  . Smokeless tobacco: Never Used  Substance and Sexual Activity  . Alcohol use: Not Currently    Frequency: Never     Comment: states hasn't had a drink in months  . Drug use: Never  . Sexual activity: Not Currently  Lifestyle  . Physical activity:    Days per week: Not on file    Minutes per session: Not on file  . Stress: Not on file  Relationships  . Social connections:    Talks on phone: Not on file    Gets together: Not on file    Attends religious service: Not on file    Active member of club or organization: Not on file    Attends meetings of clubs or organizations: Not on file    Relationship status: Not on file  Other Topics Concern  . Not on file  Social History Narrative  . Not on file   Additional Social History:                         Sleep: Good  Appetite:  Good  Current Medications: Current Facility-Administered Medications  Medication Dose Route Frequency Provider Last Rate Last Dose  . acetaminophen (TYLENOL) tablet 650 mg  650 mg Oral Q6H PRN Clapacs, John T, MD      . alum & mag hydroxide-simeth (MAALOX/MYLANTA) 200-200-20 MG/5ML suspension 30 mL  30 mL Oral Q4H PRN Clapacs, Jackquline Denmark, MD      .  benztropine (COGENTIN) tablet 1 mg  1 mg Oral Q6H PRN Beverly Sessions, MD       Or  . benztropine mesylate (COGENTIN) injection 1 mg  1 mg Intramuscular Q6H PRN Beverly Sessions, MD      . carbamazepine (TEGRETOL) tablet 200 mg  200 mg Oral BID McNew, Ileene Hutchinson, MD   200 mg at 11/22/17 1059  . haloperidol (HALDOL) tablet 5 mg  5 mg Oral Q6H PRN Beverly Sessions, MD       Or  . haloperidol lactate (HALDOL) injection 5 mg  5 mg Intramuscular Q6H PRN Beverly Sessions, MD      . LORazepam (ATIVAN) injection 1 mg  1 mg Intramuscular Q6H PRN He, Jun, MD   1 mg at 11/22/17 0208  . LORazepam (ATIVAN) tablet 1 mg  1 mg Oral Q6H PRN Beverly Sessions, MD   1 mg at 11/22/17 4098   Or  . LORazepam (ATIVAN) injection 1 mg  1 mg Intramuscular Q6H PRN Beverly Sessions, MD      . magnesium hydroxide (MILK OF MAGNESIA) suspension 30 mL  30 mL Oral Daily PRN Clapacs, John T, MD       . QUEtiapine (SEROQUEL) tablet 300 mg  300 mg Oral QHS McNew, Ileene Hutchinson, MD      . traZODone (DESYREL) tablet 100 mg  100 mg Oral QHS PRN He, Jun, MD   100 mg at 11/18/17 2125    Lab Results: No results found for this or any previous visit (from the past 48 hour(s)).  Blood Alcohol level:  Lab Results  Component Value Date   ETH <10 11/04/2017    Metabolic Disorder Labs: No results found for: HGBA1C, MPG No results found for: PROLACTIN No results found for: CHOL, TRIG, HDL, CHOLHDL, VLDL, LDLCALC  Physical Findings: AIMS: Facial and Oral Movements Muscles of Facial Expression: None, normal Lips and Perioral Area: None, normal Jaw: None, normal Tongue: None, normal,Extremity Movements Upper (arms, wrists, hands, fingers): None, normal Lower (legs, knees, ankles, toes): None, normal, Trunk Movements Neck, shoulders, hips: None, normal, Overall Severity Severity of abnormal movements (highest score from questions above): None, normal Incapacitation due to abnormal movements: None, normal Patient's awareness of abnormal movements (rate only patient's report): No Awareness, Dental Status Current problems with teeth and/or dentures?: No Does patient usually wear dentures?: No  CIWA:    COWS:     Musculoskeletal: Strength & Muscle Tone: within normal limits Gait & Station: normal Patient leans: N/A  Psychiatric Specialty Exam: Physical Exam  Nursing note and vitals reviewed.   Review of Systems  All other systems reviewed and are negative.   Blood pressure 107/76, pulse 93, temperature 99 F (37.2 C), temperature source Oral, resp. rate 16, height 5\' 8"  (1.727 m), weight 80.7 kg, SpO2 98 %.Body mass index is 27.06 kg/m.  General Appearance: Casual  Eye Contact:  Good  Speech:  Clear and Coherent  Volume:  Normal  Mood: anxious  Affect:  Labile, irritable  Thought Process:  Coherent and Goal Directed  Orientation:  Full (Time, Place, and Person)  Thought Content:   Logical  Suicidal Thoughts:  No  Homicidal Thoughts:  No, risk of agittaion  Memory:  Immediate;   Poor, long term memory intact MOCA 21/30  Judgement:  Impaired  Insight:  Lacking  Psychomotor Activity:  Normal  Concentration:  Concentration: Fair  Recall:  [employer] of Knowledge:  Fair  Language:  Fair  Akathisia:  No  Assets:  Resilience  ADL's:  Intact  Cognition:  Impaired,  Moderate  Sleep:  Number of Hours: 7.25     Treatment Plan Summary: 47 yo female with early onset memory impairemment, now with outburst,  Plan:  Mood -cont  Seroquel at 300 mg qhs -Continue Tegretol 200 mg BID  Alcohol included dementia -mild-moderate degree of memory impairment -Will need new placement hopefully in less restrictive level of care this time  Dispo -will contact the  guardian to discuss placement recommendations and options.   Beverly Sessions, MD 11/22/2017, 4:25 PMPatient ID: Emiliano Dyer, female   DOB: [birth year], 47 y.o.   MRN: [id number]

## 2017-11-23 NOTE — Plan of Care (Signed)
Patient has been observed out in the milieu, outside in the courtyard with staff and other members on the unit, attending/participating in social work group, as well as interacting with other members on the unit without any issues. Patient has used fair eye contact while communicating with this Clinical research associate. Patient has the ability to identify the available resources that can assist her in meeting her health-care needs, however, she has not voiced anything to this Clinical research associate as of yet. Patient had no stated goals for today. Patient has been free from injury and remains safe on the unit at this time.  Problem: Activity: Goal: Will identify at least one activity in which they can participate Outcome: Progressing  Problem: Coping: Goal: Ability to identify and develop effective coping behavior will improve Outcome: Progressing Goal: Ability to interact with others will improve Outcome: Progressing Goal: Demonstration of participation in decision-making regarding own care will improve Outcome: Progressing Goal: Ability to use eye contact when communicating with others will improve Outcome: Progressing   Problem: Health Behavior/Discharge Planning: Goal: Identification of resources available to assist in meeting health care needs will improve Outcome: Progressing   Problem: Self-Concept: Goal: Will verbalize positive feelings about self Outcome: Progressing   Problem: Safety: Goal: Ability to remain free from injury will improve Outcome: Progressing

## 2017-11-23 NOTE — Progress Notes (Signed)
D: Pt. Found pacing the hallways upon arrival. Pt. Observed frequently pacing in the hallway irritable and agitated. Pt. After sometime pacing proceeded to isolate to her room for the night. During medication pass patient was able to be directed and encouraged to come up for her night time medications. Pt. Was able to comply with night time medications, but presents with continued irritability and agitations, cursing to herself loudly coming up for medications. Pt. Forwards little and is minimal, but does engage some. Pt. Takes medications and returns to room to isolate. Pt. Able to deny si/hi.   A: Q x 15 minute observation checks were completed for safety. Patient was attempted to be provided with education, but refused.  Patient was given/offered medications per orders. Patient  was encourage to attend groups, participate in unit activities and continue with plan of care, but refuses. Pt. Chart and plans of care reviewed. Pt. Given support and encouragement.   R: Patient is complaint with medications with encouragement and direction from staff. Pt. Unwilling to participate in unit activities or snacks. Pt. Not receptive to treatment.             Precautionary checks every 15 minutes for safety maintained, room free of safety hazards, patient sustains no injury or falls during this shift. Will endorse care to next shift.

## 2017-11-23 NOTE — Progress Notes (Signed)
[facility] MD Progress Note  11/23/2017 11:29 AM Joanne Johnston  MRN:  [id number] Subjective:   What do you want?" Pt still  labile and irritable, poor eye contact,  but taking meds, denies side effects.   Denies AVH.   Principal Problem: Unspecified mood (affective) disorder (HCC) Diagnosis:   Patient Active Problem List   Diagnosis Date Noted  . Unspecified mood (affective) disorder (HCC) [F39] 11/18/2017  . 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 Agitation [R45.1] 11/04/2017   Total Time spent with patient: 25 min  Past Psychiatric History: See H&P  Past Medical History:  Past Medical History:  Diagnosis Date  . Alcohol dependence (HCC)   . Anxiety   . Dementia   . Depression   . Hypertension   . Tachycardia     Past Surgical History:  Procedure Laterality Date  . CESAREAN SECTION     x2   Family History: History reviewed. No pertinent family history. Family Psychiatric  History: See H&P Social History:  Social History   Substance and Sexual Activity  Alcohol Use Not Currently  . Frequency: Never   Comment: states hasn't had a drink in months     Social History   Substance and Sexual Activity  Drug Use Never    Social History   Socioeconomic History  . Marital status: Divorced    Spouse name: Not on file  . Number of children: Not on file  . Years of education: Not on file  . Highest education level: Not on file  Occupational History  . Not on file  Social Needs  . Financial resource strain: Not on file  . Food insecurity:    Worry: Not on file    Inability: Not on file  . Transportation needs:    Medical: Not on file    Non-medical: Not on file  Tobacco Use  . Smoking status: Never Smoker  . Smokeless tobacco: Never Used  Substance and Sexual Activity  . Alcohol use: Not Currently    Frequency: Never    Comment: states hasn't had a drink in months  . Drug use: Never  . Sexual activity: Not Currently  Lifestyle  .  Physical activity:    Days per week: Not on file    Minutes per session: Not on file  . Stress: Not on file  Relationships  . Social connections:    Talks on phone: Not on file    Gets together: Not on file    Attends religious service: Not on file    Active member of club or organization: Not on file    Attends meetings of clubs or organizations: Not on file    Relationship status: Not on file  Other Topics Concern  . Not on file  Social History Narrative  . Not on file   Additional Social History:                         Sleep: Good  Appetite:  Good  Current Medications: Current Facility-Administered Medications  Medication Dose Route Frequency Provider Last Rate Last Dose  . acetaminophen (TYLENOL) tablet 650 mg  650 mg Oral Q6H PRN Clapacs, John T, MD      . alum & mag hydroxide-simeth (MAALOX/MYLANTA) 200-200-20 MG/5ML suspension 30 mL  30 mL Oral Q4H PRN Clapacs, John T, MD      . benztropine (COGENTIN) tablet 1 mg  1 mg Oral Q6H PRN Beverly Sessions, MD  Or  . benztropine mesylate (COGENTIN) injection 1 mg  1 mg Intramuscular Q6H PRN Beverly Sessions, MD      . carbamazepine (TEGRETOL) tablet 200 mg  200 mg Oral BID McNew, Holly R, MD   200 mg at 11/23/17 1021  . haloperidol (HALDOL) tablet 5 mg  5 mg Oral Q6H PRN Beverly Sessions, MD   5 mg at 11/22/17 1646   Or  . haloperidol lactate (HALDOL) injection 5 mg  5 mg Intramuscular Q6H PRN Beverly Sessions, MD      . LORazepam (ATIVAN) injection 1 mg  1 mg Intramuscular Q6H PRN He, Jun, MD   1 mg at 11/22/17 0208  . LORazepam (ATIVAN) tablet 1 mg  1 mg Oral Q6H PRN Beverly Sessions, MD   1 mg at 11/22/17 1646   Or  . LORazepam (ATIVAN) injection 1 mg  1 mg Intramuscular Q6H PRN Beverly Sessions, MD      . magnesium hydroxide (MILK OF MAGNESIA) suspension 30 mL  30 mL Oral Daily PRN Clapacs, John T, MD      . QUEtiapine (SEROQUEL) tablet 300 mg  300 mg Oral QHS McNew, Ileene Hutchinson, MD   300 mg at 11/22/17  2105  . thiamine (VITAMIN B-1) tablet 100 mg  100 mg Oral Daily Beverly Sessions, MD   100 mg at 11/23/17 1020  . traZODone (DESYREL) tablet 100 mg  100 mg Oral QHS PRN He, Jun, MD   100 mg at 11/22/17 2106    Lab Results: No results found for this or any previous visit (from the past 48 hour(s)).  Blood Alcohol level:  Lab Results  Component Value Date   ETH <10 11/04/2017    Metabolic Disorder Labs: No results found for: HGBA1C, MPG No results found for: PROLACTIN No results found for: CHOL, TRIG, HDL, CHOLHDL, VLDL, LDLCALC  Physical Findings: AIMS: Facial and Oral Movements Muscles of Facial Expression: None, normal Lips and Perioral Area: None, normal Jaw: None, normal Tongue: None, normal,Extremity Movements Upper (arms, wrists, hands, fingers): None, normal Lower (legs, knees, ankles, toes): None, normal, Trunk Movements Neck, shoulders, hips: None, normal, Overall Severity Severity of abnormal movements (highest score from questions above): None, normal Incapacitation due to abnormal movements: None, normal Patient's awareness of abnormal movements (rate only patient's report): No Awareness, Dental Status Current problems with teeth and/or dentures?: No Does patient usually wear dentures?: No  CIWA:    COWS:     Musculoskeletal: Strength & Muscle Tone: within normal limits Gait & Station: normal Patient leans: N/A  Psychiatric Specialty Exam: Physical Exam  Nursing note and vitals reviewed.   Review of Systems  All other systems reviewed and are negative.   Blood pressure 107/76, pulse 93, temperature 99 F (37.2 C), temperature source Oral, resp. rate 16, height 5\' 8"  (1.727 m), weight 80.7 kg, SpO2 98 %.Body mass index is 27.06 kg/m.  General Appearance: Casual  Eye Contact:  Good  Speech:  Clear and Coherent  Volume:  Normal  Mood: anxious  Affect:  Labile, irritable  Thought Process:  Coherent and Goal Directed  Orientation:  Full (Time, Place,  and Person)  Thought Content:  Concrete, suspicious  Suicidal Thoughts:  No  Homicidal Thoughts:  No, risk of agittaion  Memory:  Immediate;   Poor, long term memory intact MOCA 21/30  Judgement:  Impaired  Insight:  Lacking  Psychomotor Activity:  Normal  Concentration:  Concentration: Fair  Recall:  [employer] of Knowledge:  Fair  Language:  Fair  Akathisia:  No      Assets:  Resilience  ADL's:  Intact  Cognition:  Impaired,  Moderate  Sleep:  Number of Hours: 8     Treatment Plan Summary: 47 yo female with early onset memory impairemment, pt labile, irritable.  Plan:  Mood -cont  Seroquel at 300 mg qhs -Continue Tegretol 200 mg BID  Alcohol included dementia thiamine -mild-moderate degree of memory impairment -Will need new placement hopefully in less restrictive level of care this time  Dispo -will contact the  guardian to discuss placement recommendations and options.   Beverly Sessions, MD 11/23/2017, 11:29 AMPatient ID: Joanne Johnston, female   DOB: 07/07/1970, 47 y.o.   MRN: [id number] Patient ID: Joanne Johnston, female   DOB: 01/09/1971, 47 y.o.   MRN: [id number]

## 2017-11-23 NOTE — [facility] Group Notes (Signed)
LCSW Group Therapy Note 11/23/2017 1:15pm  Type of Therapy and Topic: Group Therapy: Feelings Around Returning Home & Establishing a Supportive Framework and Supporting Oneself When Supports Not Available  Participation Level: Active  Description of Group:  Patients first processed thoughts and feelings about upcoming discharge. These included fears of upcoming changes, lack of change, new living environments, judgements and expectations from others and overall stigma of mental health issues. The group then discussed the definition of a supportive framework, what that looks and feels like, and how do to discern it from an unhealthy non-supportive network. The group identified different types of supports as well as what to do when your family/friends are less than helpful or unavailable  Therapeutic Goals  1. Patient will identify one healthy supportive network that they can use at discharge. 2. Patient will identify one factor of a supportive framework and how to tell it from an unhealthy network. 3. Patient able to identify one coping skill to use when they do not have positive supports from others. 4. Patient will demonstrate ability to communicate their needs through discussion and/or role plays.  Summary of Patient Progress:  The patient scored her mood at a 5 (10 best.) Pt engaged during group session. As patients processed their anxiety about discharge and described healthy supports patient shared she is ready to be discharge. Patient reported "I got my emotions back in check . I know what I need to do." Patient listed her family in [place] as her main support. Patients identified at least one self-care tool they were willing to use after discharge; reading, journaling, and watching tv..   Therapeutic Modalities Cognitive Behavioral Therapy Motivational Interviewing   Asmaa Tirpak  [name], LCSW 11/23/2017 10:40 AM

## 2017-11-23 NOTE — Progress Notes (Signed)
D- Patient alert and oriented. Patient presents in a pleasant mood on assessment stating that she slept "so-so" last night and stated to this writer that overall "I feel fine". Patient had no major complaints/concerns to voice upon assessment and also denied any signs/symptoms of depression and anxiety. Patient denies SI, HI, AVH, and pain at this time. Patient has no stated goals for today.  A- Scheduled medications administered to patient, per MD orders. Support and encouragement provided.  Routine safety checks conducted every 15 minutes.  Patient informed to notify staff with problems or concerns.  R- No adverse drug reactions noted. Patient contracts for safety at this time. Patient compliant with medications and treatment plan. Patient receptive, calm, and cooperative. Patient interacts well with others on the unit.  Patient remains safe at this time.

## 2017-11-24 LAB — CARBAMAZEPINE LEVEL, TOTAL: CARBAMAZEPINE LVL: 8.1 ug/mL (ref 4.0–12.0)

## 2017-11-24 LAB — BASIC METABOLIC PANEL
Anion gap: 9 (ref 5–15)
BUN: 12 mg/dL (ref 6–20)
CHLORIDE: 100 mmol/L (ref 98–111)
CO2: 26 mmol/L (ref 22–32)
Calcium: 9 mg/dL (ref 8.9–10.3)
Creatinine, Ser: 0.9 mg/dL (ref 0.44–1.00)
GFR calc Af Amer: >60 mL/min (ref >60)
Glucose, Bld: 90 mg/dL (ref 70–99)
POTASSIUM: 4 mmol/L (ref 3.5–5.1)
SODIUM: 135 mmol/L (ref 135–145)

## 2017-11-24 NOTE — Plan of Care (Signed)
Patient was calm and appropriate in the unit.Patient did not want to elaborate the conversation with the writer but appropriate with answering questions.No irritable behaviors noted.Compliant with medications.Attended groups.Appetite and energy level good.Support and encouragement given.

## 2017-11-24 NOTE — [facility] Group Notes (Signed)
[facility] Group Notes:  (Nursing/MHT/Case Management/Adjunct)  Date:  11/24/2017  Time:  9:37 AM  Type of Therapy:  Psychoeducational Skills  Participation Level:  Minimal  Participation Quality:  Resistant  Affect:  Irritable and Resistant  Cognitive:  Lacking  Insight:  Limited  Engagement in Group:  Limited, Poor and Resistant  Modes of Intervention:  Discussion, Education and Exploration  Summary of Progress/Problems:  Joanne Johnston 11/24/2017, 9:37 AM

## 2017-11-24 NOTE — Plan of Care (Signed)
Patient is avoiding any social interaction and isolating to her room , took her medication, therapeutic  interactions are  provided to improve self esteem and to reinforce positive feeling about self. Denies any SI/HI and no signs of AVH. Problem: Activity: Goal: Will identify at least one activity in which they can participate Outcome: Progressing   Problem: Coping: Goal: Ability to identify and develop effective coping behavior will improve Outcome: Progressing Goal: Ability to interact with others will improve Outcome: Progressing Goal: Demonstration of participation in decision-making regarding own care will improve Outcome: Progressing Goal: Ability to use eye contact when communicating with others will improve Outcome: Progressing   Problem: Health Behavior/Discharge Planning: Goal: Identification of resources available to assist in meeting health care needs will improve Outcome: Progressing   Problem: Self-Concept: Goal: Will verbalize positive feelings about self Outcome: Progressing   Problem: Safety: Goal: Ability to remain free from injury will improve Outcome: Progressing

## 2017-11-24 NOTE — Progress Notes (Signed)
[facility] MD Progress Note  11/24/2017 12:03 PM Joanne Johnston  MRN:  [id number] Subjective:  Per staff, pt is less irritable and less impulsive than previous. She dd attend group and was appropriate. She interacts with peers very appropriately with no agitation or violence towards anyone.  She is attending some groups with very appropriate participation. On evaluation today, she is very calm and pleasant. We were, again, able to have a very nice conversation about her care.  We disussed that we are trying to find a more appropriate placement for her and something less restrictive than a locked memory unit. She states that she is very glad that people are trying to advocate for her and put her in an environment that she can thrive. She does admit that it would be helpful for her to have some supervision but also wants some freedom and not locked up. She is very organized in thoughts and asked very appropriate questions. She is medication compliant and is sleeping well with Seroquel. She is open to getting blood work done today to check Tegretol level. She continuously denies SI, HI, AH, VH. She was on the phone for at least an hour and had no outbursts at all and calmly hung up the phone when phone hours were over.    On Friday, We discussed her memory deficits. She states that she has trouble with short term things. She states, "my long term memory is great." She sat down with me and we did MOCA together. She scored 21/30 but this score could have been higher as she was not familiar with this hospital as she was brought here by ambulance. The main deficit was in delayed recall. She did really well on the other sections including visuospatial and executive functioning.  Principal Problem: Unspecified mood (affective) disorder (HCC) Diagnosis:   Patient Active Problem List   Diagnosis Date Noted  . Unspecified mood (affective) disorder (HCC) [F39] 11/18/2017    Priority: High  . Alcohol-induced persisting  dementia (HCC) [F10.27] 11/04/2017    Priority: High  . 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   Past Medical History:  Past Medical History:  Diagnosis Date  . Alcohol dependence (HCC)   . Anxiety   . Dementia   . Depression   . Hypertension   . Tachycardia     Past Surgical History:  Procedure Laterality Date  . CESAREAN SECTION     x2   Family History: History reviewed. No pertinent family history. Family Psychiatric  History: See H&P Social History:  Social History   Substance and Sexual Activity  Alcohol Use Not Currently  . Frequency: Never   Comment: states hasn't had a drink in months     Social History   Substance and Sexual Activity  Drug Use Never    Social History   Socioeconomic History  . Marital status: Divorced    Spouse name: Not on file  . Number of children: Not on file  . Years of education: Not on file  . Highest education level: Not on file  Occupational History  . Not on file  Social Needs  . Financial resource strain: Not on file  . Food insecurity:    Worry: Not on file  Inability: Not on file  . Transportation needs:    Medical: Not on file    Non-medical: Not on file  Tobacco Use  . Smoking status: Never Smoker  . Smokeless tobacco: Never Used  Substance and Sexual Activity  . Alcohol use: Not Currently    Frequency: Never    Comment: states hasn't had a drink in months  . Drug use: Never  . Sexual activity: Not Currently  Lifestyle  . Physical activity:    Days per week: Not on file    Minutes per session: Not on file  . Stress: Not on file  Relationships  . Social connections:    Talks on phone: Not on file    Gets together: Not  on file    Attends religious service: Not on file    Active member of club or organization: Not on file    Attends meetings of clubs or organizations: Not on file    Relationship status: Not on file  Other Topics Concern  . Not on file  Social History Narrative  . Not on file   Additional Social History:                         Sleep: Good  Appetite:  Good  Current Medications: Current Facility-Administered Medications  Medication Dose Route Frequency Provider Last Rate Last Dose  . acetaminophen (TYLENOL) tablet 650 mg  650 mg Oral Q6H PRN Clapacs, John T, MD      . alum & mag hydroxide-simeth (MAALOX/MYLANTA) 200-200-20 MG/5ML suspension 30 mL  30 mL Oral Q4H PRN Clapacs, John T, MD      . benztropine (COGENTIN) tablet 1 mg  1 mg Oral Q6H PRN Beverly Sessions, MD       Or  . benztropine mesylate (COGENTIN) injection 1 mg  1 mg Intramuscular Q6H PRN Beverly Sessions, MD      . carbamazepine (TEGRETOL) tablet 200 mg  200 mg Oral BID Ireanna Finlayson R, MD   200 mg at 11/24/17 1610  . haloperidol (HALDOL) tablet 5 mg  5 mg Oral Q6H PRN Beverly Sessions, MD   5 mg at 11/22/17 1646   Or  . haloperidol lactate (HALDOL) injection 5 mg  5 mg Intramuscular Q6H PRN Beverly Sessions, MD      . LORazepam (ATIVAN) injection 1 mg  1 mg Intramuscular Q6H PRN He, Jun, MD   1 mg at 11/22/17 0208  . LORazepam (ATIVAN) tablet 1 mg  1 mg Oral Q6H PRN Beverly Sessions, MD   1 mg at 11/22/17 1646   Or  . LORazepam (ATIVAN) injection 1 mg  1 mg Intramuscular Q6H PRN Beverly Sessions, MD      . magnesium hydroxide (MILK OF MAGNESIA) suspension 30 mL  30 mL Oral Daily PRN Clapacs, John T, MD      . QUEtiapine (SEROQUEL) tablet 300 mg  300 mg Oral QHS Aarron Wierzbicki, Ileene Hutchinson, MD   300 mg at 11/23/17 2034  . thiamine (VITAMIN B-1) tablet 100 mg  100 mg Oral Daily Beverly Sessions, MD   100 mg at 11/24/17 9604  . traZODone (DESYREL) tablet 100 mg  100 mg Oral QHS PRN He, Jun, MD   100 mg at 11/22/17  2106    Lab Results: No results found for this or any previous visit (from the past 48 hour(s)).  Blood Alcohol level:  Lab Results  Component Value Date   ETH <10 11/04/2017  Metabolic Disorder Labs: No results found for: HGBA1C, MPG No results found for: PROLACTIN No results found for: CHOL, TRIG, HDL, CHOLHDL, VLDL, LDLCALC  Physical Findings: AIMS: Facial and Oral Movements Muscles of Facial Expression: None, normal Lips and Perioral Area: None, normal Jaw: None, normal Tongue: None, normal,Extremity Movements Upper (arms, wrists, hands, fingers): None, normal Lower (legs, knees, ankles, toes): None, normal, Trunk Movements Neck, shoulders, hips: None, normal, Overall Severity Severity of abnormal movements (highest score from questions above): None, normal Incapacitation due to abnormal movements: None, normal Patient's awareness of abnormal movements (rate only patient's report): No Awareness, Dental Status Current problems with teeth and/or dentures?: No Does patient usually wear dentures?: No  CIWA:    COWS:     Musculoskeletal: Strength & Muscle Tone: within normal limits Gait & Station: normal , steady with no issues Patient leans: N/A  Psychiatric Specialty Exam: Physical Exam  Nursing note and vitals reviewed.   Review of Systems  All other systems reviewed and are negative.   Blood pressure 125/84, pulse 91, temperature 99 F (37.2 C), temperature source Oral, resp. rate 20, height 5\' 8"  (1.727 m), weight 80.7 kg, SpO2 100 %.Body mass index is 27.06 kg/m.  General Appearance: Casual, caring for her ADLs and hygiene well  Eye Contact:  Good  Speech:  Clear and Coherent  Volume:  Normal  Mood:  Euthymic  Affect:  Labile  Thought Process:  Coherent and Goal Directed  Orientation:  Full (Time, Place, and Person)  Thought Content:  Logical  Suicidal Thoughts:  No  Homicidal Thoughts:  No  Memory:  Immediate;   Fair  MOCA on Friday was 21/30   Judgement:  Fair  Insight:  Lacking  Psychomotor Activity:  Normal  Concentration:  Concentration: Fair  Recall:  [employer] of Knowledge:  Fair  Language:  Fair  Akathisia:  No      Assets:  Resilience  ADL's:  Intact  Cognition:  Impaired,  Mild  Sleep:  Number of Hours: 8     Treatment Plan Summary: 47 yo female admitted due to agitation at long term treatment center. She does have some verbal outburst at times but per staff these are much less frequent. She is able to calm herself down well. She has not had any physical aggression or violence at all. I have been able to have great conversations with her. I have been trying to involve her in her care as much as possible and she responds very well to this. She expresses her frustration with being locked up in a hospital and most recently in a memory care unit with people not close to her age. She has had some difficulty and frustration with change in independence. MOCA was done on Friday showed some difficulty with delayed recall but overall she did well. She scored 21/30. I feel that a locked memory care unit was too restrictive level of care for her and would benefit much more by more independent living with some supervision which she is very open to. I strongly feel that a locked memory care unit was much too restrictive level of care for her and decompensated because of this. This has not worked for her in the past and not likely to be beneficial for her in the future. She is higher functioning and has been adequately able to care for herself. I wouldn't classify her memory issues as "severe" and more mild-moderate deficit in short term memory. I think she would benefit more  from an assisted living type environment or group home setting and this would allow her more independance and allow her to thrive. She could benefit from some supervision of medications.  I did speak with her guardian about this recommendation today. She has been  compliant with her medications.   Plan:  Mood disorder Continue Seroquel 300 mg qhs -Continue Tegretol 200 mg BID. Will need to check level today, Check sodium level  Alcohol induced dementia -In my opinion, mild-moderate degree of short term memory issues (not "severe") -Will need placement with some supervision  Dispo -Spoke with guardian today to discuss placement options.  -Recommend less restrictive level of care than locked memory unit  Haskell Riling, MD 11/24/2017, 12:03 PM

## 2017-11-24 NOTE — Progress Notes (Signed)
Recreation Therapy Notes  Date: 11/24/2017  Time: 9:30 am  Location: Craft Room  Behavioral response: Appropriate   Intervention Topic: Team Work  Discussion/Intervention:  Group content on today was focused on teamwork. The group identified what teamwork is. Individuals described who is a part of their team. Patients expressed why they thought teamwork is important. The group stated reasons why they thought it was easier to work with a Comptroller team. Individuals discussed some positives and negatives of working with a team. Patients gave examples of past experiences they had while working with a team. The group participated in the intervention "Story in a bag", patients were in groups and were able to test their skill in a team setting.  Clinical Observations/Feedback:  Patient attended group late and was upbeat with her peers and staff. Individual left group early due to unknown reasons, she did not slam the door on her way out.  Miachel Nardelli LRT/CTRS         Iviona Hole 11/24/2017 12:33 PM

## 2017-11-25 NOTE — Progress Notes (Signed)
Received Joanne Johnston this AM in her room siting on the side of the bed, she asked this writer what did I want. I returned with her morning medication, she refused to take the medication. She has been OOB in the milieu at intervals throughout the day. I asked her to come for her medications when she is ready.

## 2017-11-25 NOTE — Progress Notes (Signed)
Recreation Therapy Notes  Date: 11/25/2017  Time: 9:30 am   Location: Craft room   Behavioral response: N/A   Intervention Topic: Coping skills  Discussion/Intervention: Patient did not attend group.   Clinical Observations/Feedback:  Patient did not attend group.   Joanne Johnston LRT/CTRS         Joanne Johnston 11/25/2017 10:44 AM

## 2017-11-25 NOTE — [place] FL2 (Addendum)
[place] MEDICAID FL2 LEVEL OF CARE SCREENING TOOL     IDENTIFICATION  Patient Name: Joanne Johnston Birthdate: 28-Jan-1971 Sex: female Admission Date (Current Location): 11/14/2017  [place] and [place] Number:  Zane Herald Kelly) [id number] [phone]  Facility and Address:  [facility], [address] [place], [place] [address]      Provider Number: [id number]  Attending Physician Name and Address:  Haskell Riling, MD  Relative Name and Phone Number:  Guardian Heath Lark [employer] [phone]    Current Level of Care: Hospital Recommended Level of Care: GROUP HOME OR ASSISTED LIVING Prior Approval Number:    Date Approved/Denied:   PASRR Number:    Discharge Plan: Other (Comment)(Group Home)    Current Diagnoses: Unspecified mood disorder 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 Fully oriented to person, place and date.   Orientation RESPIRATION BLADDER Height & Weight     Self, Time, Place  Normal Continent Weight: 178 lb (80.7 kg) Height:  5\' 8"  (172.7 cm)  BEHAVIORAL SYMPTOMS/MOOD NEUROLOGICAL BOWEL NUTRITION STATUS  Verbal outburts and irritability at times due to being in a locked facility-much improved with medications. Has not had any outbursts in several days.    Continent    AMBULATORY STATUS COMMUNICATION OF NEEDS Skin   Independent Verbally Normal                       Personal Care Assistance Level of Assistance  Bathing, Feeding, Dressing Bathing Assistance: Independent Feeding assistance: Independent Dressing Assistance: Independent     Functional Limitations Info  Sight, Hearing, Speech Sight Info: Adequate Hearing Info: Adequate Speech Info: Adequate     SPECIAL CARE FACTORS FREQUENCY                       Contractures Contractures Info: Not present    Additional Factors Info                  Current Medications (11/25/2017):  This is the current hospital active medication list Current Facility-Administered Medications  Medication Dose Route Frequency Provider Last Rate Last Dose  . acetaminophen (TYLENOL) tablet 650 mg  650 mg Oral Q6H PRN Clapacs, John T, MD      . alum & mag hydroxide-simeth (MAALOX/MYLANTA) 200-200-20 MG/5ML suspension 30 mL  30 mL Oral Q4H PRN Clapacs, John T, MD      . benztropine (COGENTIN) tablet 1 mg  1 mg Oral Q6H PRN Beverly Sessions, MD       Or  . benztropine mesylate (COGENTIN) injection 1 mg  1 mg Intramuscular Q6H PRN Beverly Sessions, MD      . carbamazepine (TEGRETOL) tablet 200 mg  200 mg Oral BID Roselani Grajeda R, MD   200 mg at 11/24/17 2049  . haloperidol (HALDOL) tablet 5 mg  5 mg Oral Q6H PRN Beverly Sessions, MD   5 mg at 11/22/17 1646   Or  . haloperidol lactate (HALDOL) injection 5 mg  5 mg Intramuscular Q6H PRN Beverly Sessions, MD      . LORazepam (ATIVAN) injection 1 mg  1 mg Intramuscular Q6H PRN He, Jun, MD   1 mg at 11/22/17 0208  . LORazepam (ATIVAN) tablet 1 mg  1 mg Oral Q6H PRN  Beverly Sessions, MD   1 mg at 11/22/17 1646   Or  . LORazepam (ATIVAN) injection 1 mg  1 mg Intramuscular Q6H PRN Beverly Sessions, MD      . magnesium hydroxide (MILK OF MAGNESIA) suspension 30 mL  30 mL Oral Daily PRN Clapacs, John T, MD      . QUEtiapine (SEROQUEL) tablet 300 mg  300 mg Oral QHS Breiona Couvillon, Ileene Hutchinson, MD   300 mg at 11/24/17 2049  . thiamine (VITAMIN B-1) tablet 100 mg  100 mg Oral Daily Beverly Sessions, MD   100 mg at 11/24/17 0981  . traZODone (DESYREL) tablet 100 mg  100 mg Oral QHS PRN He, Jun, MD   100 mg at 11/24/17 2049     Discharge Medications: Please see discharge summary for a list of discharge medications.  Relevant Imaging Results:  Relevant Lab  Results:   Additional Information    Glennon Mac, LCSW

## 2017-11-25 NOTE — Plan of Care (Addendum)
Patient found in day room upon my arrival. Patient is visible but not social this evening. Patient spoke on phone at length with someone who seemed to be encouraging her to take her medication which she did without incident. Education accepted but processing remains impaired. Needs reinforcement. Patient is minimally verbal this evening and had no outbursts. Denies all complaints including SI/HI/AVH. Compliant with unit routine. Appetite is adequate. Given Trazodone for sleep with positive results. Q 15 minute checks maintained. Will continue to monitor throughout the shift. Patient slept 7.75 hours. No apparent distress. Refused VS. Will endorse care to oncoming shift.  Problem: Coping: Goal: Ability to interact with others will improve Outcome: Progressing Goal: Demonstration of participation in decision-making regarding own care will improve Outcome: Progressing Goal: Ability to use eye contact when communicating with others will improve Outcome: Progressing

## 2017-11-25 NOTE — Progress Notes (Signed)
[facility] MD Progress Note  11/25/2017 1:23 PM Joanne Johnston  MRN:  [id number]   Subjective:  Pt is in her room laying in bed upon my assessment. She has been having less verbal outbursts. She was observed by staff calmly talking on the phone last night. She took her medications last night but refused them this morning. She did not have any verbal outbursts last night or this morning. She is interacting well with peers. She is sad because she is still in the hospital and hoping to go to a place where she isn't locked up. She appreciated that we are trying to advocate for her and place her in the least restrictive level of care.    On Friday, We discussed her memory deficits. She states that she has trouble with short term things. She states, "my long term memory is great." She sat down with me and we did [place] together. She scored 21/30 but this score could have been higher as she was not familiar with this hospital as she was brought here by ambulance. The main deficit was in delayed recall. She did really well on the other sections including visuospatial and executive functioning. Principal Problem: Unspecified mood (affective) disorder ([place]) Diagnosis:   Patient Active Problem List   Diagnosis Date Noted  . Unspecified mood (affective) disorder ([place]) [F39] 11/18/2017    Priority: High  . 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 Agitation [R45.1] 11/04/2017   Total Time spent with patient: 15 minutes  Past Psychiatric History: See H&p  Past Medical History:  Past Medical History:  Diagnosis Date  . Alcohol dependence ([place])   . Anxiety   . Dementia   . Depression   . Hypertension   . Tachycardia     Past Surgical History:  Procedure Laterality Date  . CESAREAN SECTION     x2   Family History: History reviewed. No pertinent family history. Family Psychiatric  History: See H&P Social History:  Social History   Substance and Sexual Activity   Alcohol Use Not Currently  . Frequency: Never   Comment: states hasn't had a drink in months     Social History   Substance and Sexual Activity  Drug Use Never    Social History   Socioeconomic History  . Marital status: Divorced    Spouse name: Not on file  . Number of children: Not on file  . Years of education: Not on file  . Highest education level: Not on file  Occupational History  . Not on file  Social Needs  . Financial resource strain: Not on file  . Food insecurity:    Worry: Not on file    Inability: Not on file  . Transportation needs:    Medical: Not on file    Non-medical: Not on file  Tobacco Use  . Smoking status: Never Smoker  . Smokeless tobacco: Never Used  Substance and Sexual Activity  . Alcohol use: Not Currently    Frequency: Never    Comment: states hasn't had a drink in months  . Drug use: Never  . Sexual activity: Not Currently  Lifestyle  . Physical activity:    Days per week: Not on file    Minutes per session: Not on file  . Stress: Not on file  Relationships  . Social connections:    Talks on phone: Not on file    Gets together: Not on file    Attends religious service:  Not on file    Active member of club or organization: Not on file    Attends meetings of clubs or organizations: Not on file    Relationship status: Not on file  Other Topics Concern  . Not on file  Social History Narrative  . Not on file   Additional Social History:                         Sleep: Fair  Appetite:  Good  Current Medications: Current Facility-Administered Medications  Medication Dose Route Frequency Provider Last Rate Last Dose  . acetaminophen (TYLENOL) tablet 650 mg  650 mg Oral Q6H PRN Clapacs, John T, MD      . alum & mag hydroxide-simeth (MAALOX/MYLANTA) 200-200-20 MG/5ML suspension 30 mL  30 mL Oral Q4H PRN Clapacs, John T, MD      . benztropine (COGENTIN) tablet 1 mg  1 mg Oral Q6H PRN Lenward Chancellor, MD       Or  .  benztropine mesylate (COGENTIN) injection 1 mg  1 mg Intramuscular Q6H PRN Lenward Chancellor, MD      . carbamazepine (TEGRETOL) tablet 200 mg  200 mg Oral BID Darianna Amy R, MD   200 mg at 11/24/17 2049  . haloperidol (HALDOL) tablet 5 mg  5 mg Oral Q6H PRN Lenward Chancellor, MD   5 mg at 11/22/17 1646   Or  . haloperidol lactate (HALDOL) injection 5 mg  5 mg Intramuscular Q6H PRN Lenward Chancellor, MD      . LORazepam (ATIVAN) injection 1 mg  1 mg Intramuscular Q6H PRN He, Jun, MD   1 mg at 11/22/17 0208  . LORazepam (ATIVAN) tablet 1 mg  1 mg Oral Q6H PRN Lenward Chancellor, MD   1 mg at 11/22/17 1646   Or  . LORazepam (ATIVAN) injection 1 mg  1 mg Intramuscular Q6H PRN Lenward Chancellor, MD      . magnesium hydroxide (MILK OF MAGNESIA) suspension 30 mL  30 mL Oral Daily PRN Clapacs, John T, MD      . QUEtiapine (SEROQUEL) tablet 300 mg  300 mg Oral QHS Aries Townley, Tyson Babinski, MD   300 mg at 11/24/17 2049  . thiamine (VITAMIN B-1) tablet 100 mg  100 mg Oral Daily Lenward Chancellor, MD   100 mg at 11/24/17 7793  . traZODone (DESYREL) tablet 100 mg  100 mg Oral QHS PRN He, Jun, MD   100 mg at 11/24/17 2049    Lab Results:  Results for orders placed or performed during the hospital encounter of 11/14/17 (from the past 48 hour(s))  Basic metabolic panel     Status: None   Collection Time: 11/24/17  2:07 PM  Result Value Ref Range   Sodium 135 135 - 145 mmol/L   Potassium 4.0 3.5 - 5.1 mmol/L   Chloride 100 98 - 111 mmol/L   CO2 26 22 - 32 mmol/L   Glucose, Bld 90 70 - 99 mg/dL   BUN 12 6 - 20 mg/dL   Creatinine, Ser 0.90 0.44 - 1.00 mg/dL   Calcium 9.0 8.9 - 10.3 mg/dL   GFR calc non Af Amer >60 >60 mL/min   GFR calc Af Amer >60 >60 mL/min    Comment: (NOTE) The eGFR has been calculated using the CKD EPI equation. This calculation has not been validated in all clinical situations. eGFR's persistently <60 mL/min signify possible Chronic Kidney Disease.    Anion gap 9 5 -  15    Comment:  Performed at [facility], [address] [place], [place] [address]  Carbamazepine level, total     Status: None   Collection Time: 11/24/17  2:07 PM  Result Value Ref Range   Carbamazepine Lvl 8.1 4.0 - 12.0 ug/mL    Comment: Performed at [facility], [address], [place], [place] [address]    Blood Alcohol level:  Lab Results  Component Value Date   [facility] <10 52/84/1324    Metabolic Disorder Labs: No results found for: HGBA1C, MPG No results found for: PROLACTIN No results found for: CHOL, TRIG, HDL, CHOLHDL, VLDL, LDLCALC  Physical Findings: AIMS: Facial and Oral Movements Muscles of Facial Expression: None, normal Lips and Perioral Area: None, normal Jaw: None, normal Tongue: None, normal,Extremity Movements Upper (arms, wrists, hands, fingers): None, normal Lower (legs, knees, ankles, toes): None, normal, Trunk Movements Neck, shoulders, hips: None, normal, Overall Severity Severity of abnormal movements (highest score from questions above): None, normal Incapacitation due to abnormal movements: None, normal Patient's awareness of abnormal movements (rate only patient's report): No Awareness, Dental Status Current problems with teeth and/or dentures?: No Does patient usually wear dentures?: No  CIWA:    COWS:     Musculoskeletal: Strength & Muscle Tone: within normal limits Gait & Station: normal Patient leans: N/A  Psychiatric Specialty Exam: Physical Exam  Nursing note and vitals reviewed.   Review of Systems  All other systems reviewed and are negative.   Blood pressure 125/84, pulse 91, temperature 99 F (37.2 C), temperature source Oral, resp. rate 20, height _0  (1.727 m), weight 80.7 kg, SpO2 100 %.Body mass index is 27.06 kg/m.  General Appearance: Casual  Eye Contact:  Minimal  Speech:  Clear and Coherent  Volume:  Normal  Mood:  Irritable  Affect:  Constricted  Thought Process:  Coherent and Goal Directed  Orientation:   Full (Time, Place, and Person)  Thought Content:  Logical  Suicidal Thoughts:  No  Homicidal Thoughts:  No  Memory:  Immediate;   Fair  Judgement:  Impaired  Insight:  Lacking  Psychomotor Activity:  Normal  Concentration:  Concentration: Fair  Recall:  [employer] of Knowledge:  Fair  Language:  Fair  Akathisia:  No      Assets:  Resilience  ADL's:  Intact  Cognition:  WNL  Sleep:  Number of Hours: 7.75     Treatment Plan Summary: 47 yo female admitted due to agitation at long term treatment center. She does have some verbal outburst at times but per staff these are much less frequent. She has not had any outbursts last night or today at all. She interacts very appropriate with other peers.  She has not had any physical aggression or violence at all. I have been able to have great conversations with her. I have been trying to involve her in her care as much as possible and she responds very well to this. She expresses her frustration with being locked up in a hospital and most recently in a memory care unit with people not close to her age. She has had some difficulty and frustration with change in independence. MOCA was done on Friday showed some difficulty with delayed recall but overall she did well. She scored 21/30. I feel that a locked memory care unit was too restrictive level of care for her and would benefit much more by more independent living with some supervision which she is very open  to. I strongly feel that a locked memory care unit was much too restrictive level of care for her and decompensated because of this. This has not worked for her in the past and not likely to be beneficial for her in the future. She is higher functioning and has been adequately able to care for herself. I wouldn't classify her memory issues as "severe" and more mild-moderate deficit in short term memory. I think she would benefit more from an assisted living type environment or group home setting and  this would allow her more independence and allow her to thrive. She could benefit from some supervision of medications.  I did speak with her guardian about this recommendation. She has been compliant with her medications. CSW will begin referrals to group homes which would be a much less restrictive level of care for her but provide some supervision and support.   Plan:  Mood disorder -Continue Seroquel 300 mg qhs -Continue Tegretol 200 mg BID. Level yesterday was 8.1. Sodium normal at 135  Alcohol induced dementia -In my opinion, mild-moderate degree of short term memory issues, caring well for herself and ADLs -Possible placement into group home  Dispo -Pt has guardian. Needs placement into group home possibly Marylin Crosby, MD 11/25/2017, 1:23 PM

## 2017-11-25 NOTE — Progress Notes (Signed)
Patient ID: Joanne Johnston, female   DOB: 06-Apr-1970, 47 y.o.   MRN: [id number] CSW left voicemail and email for Pt's guardian in order to get proper fax number to send FL-2 per her request.  CSW cannot send FL-2 by email per policy on security of Pt information.  Asked her to call back or email with info.  Jake Shark, LCSW

## 2017-11-26 NOTE — [facility] Group Notes (Signed)
LCSW Group Therapy Note  11/26/2017 1:00 pm  Type of Therapy/Topic:  Group Therapy:  Emotion Regulation  Participation Level:  Minimal   Description of Group:    The purpose of this group is to assist patients in learning to regulate negative emotions and experience positive emotions. Patients will be guided to discuss ways in which they have been vulnerable to their negative emotions. These vulnerabilities will be juxtaposed with experiences of positive emotions or situations, and patients will be challenged to use positive emotions to combat negative ones. Special emphasis will be placed on coping with negative emotions in conflict situations, and patients will process healthy conflict resolution skills.  Therapeutic Goals: 1. Patient will identify two positive emotions or experiences to reflect on in order to balance out negative emotions 2. Patient will label two or more emotions that they find the most difficult to experience 3. Patient will demonstrate positive conflict resolution skills through discussion and/or role plays  Summary of Patient Progress:   Joanne Johnston engaged a little in today's group discussion on emotion regulation.  [name] asked to "move on" when CSW asked her to identify two positive emotions or experiences to reflect on in order to balance out negative emotions.  She shared that "apathy" is one emotion that she finds to be difficult to experience, but that she experiences very often.      Therapeutic Modalities:   Cognitive Behavioral Therapy Feelings Identification Dialectical Behavioral Therapy

## 2017-11-26 NOTE — [facility] Group Notes (Signed)

## 2017-11-26 NOTE — Progress Notes (Signed)
Received Joanne Johnston this AM in her room. She was presented with her medication, took out theTegretol and only took the Vitamin B1. She remained in her room, took a shower and went to the dinning room to eat her breakfast. She denied all of the psychiatric symptoms. Later she took the Tegretol with the doctor encouragement.

## 2017-11-26 NOTE — Plan of Care (Signed)
Patient is improving in all areas, encourage to develop effective coping behaviors, acknowledged. Problem: Activity: Goal: Will identify at least one activity in which they can participate Outcome: Progressing   Problem: Coping: Goal: Ability to identify and develop effective coping behavior will improve Outcome: Progressing Goal: Ability to interact with others will improve Outcome: Progressing Goal: Demonstration of participation in decision-making regarding own care will improve Outcome: Progressing Goal: Ability to use eye contact when communicating with others will improve Outcome: Progressing   Problem: Health Behavior/Discharge Planning: Goal: Identification of resources available to assist in meeting health care needs will improve Outcome: Progressing   Problem: Self-Concept: Goal: Will verbalize positive feelings about self Outcome: Progressing   Problem: Safety: Goal: Ability to remain free from injury will improve Outcome: Progressing

## 2017-11-26 NOTE — Progress Notes (Signed)
Recreation Therapy Notes  Date: 11/26/17  Time: 9:30 am   Location: Craft room   Behavioral response: N/A   Intervention Topic: Self-care   Discussion/Intervention: Patient did not attend group.   Clinical Observations/Feedback:  Patient did not attend group.        Joanne Johnston 11/26/2017 9:55 AM 

## 2017-11-26 NOTE — Tx Team (Addendum)
Interdisciplinary Treatment and Diagnostic Plan Update  11/26/2017 Time of Session: 10:00  am Joanne Johnston MRN: [id number]  Principal Diagnosis: Unspecified mood (affective) disorder (HCC)  Secondary Diagnoses: Principal Problem:   Unspecified mood (affective) disorder (HCC) Active Problems:   Alcohol-induced persisting dementia (HCC)   Current Medications:  Current Facility-Administered Medications  Medication Dose Route Frequency Provider Last Rate Last Dose  . acetaminophen (TYLENOL) tablet 650 mg  650 mg Oral Q6H PRN Clapacs, John T, MD      . alum & mag hydroxide-simeth (MAALOX/MYLANTA) 200-200-20 MG/5ML suspension 30 mL  30 mL Oral Q4H PRN Clapacs, John T, MD      . benztropine (COGENTIN) tablet 1 mg  1 mg Oral Q6H PRN Beverly Sessions, MD       Or  . benztropine mesylate (COGENTIN) injection 1 mg  1 mg Intramuscular Q6H PRN Beverly Sessions, MD      . carbamazepine (TEGRETOL) tablet 200 mg  200 mg Oral BID McNew, Holly R, MD   200 mg at 11/25/17 2015  . haloperidol (HALDOL) tablet 5 mg  5 mg Oral Q6H PRN Beverly Sessions, MD   5 mg at 11/22/17 1646   Or  . haloperidol lactate (HALDOL) injection 5 mg  5 mg Intramuscular Q6H PRN Beverly Sessions, MD      . LORazepam (ATIVAN) injection 1 mg  1 mg Intramuscular Q6H PRN He, Jun, MD   1 mg at 11/22/17 0208  . LORazepam (ATIVAN) tablet 1 mg  1 mg Oral Q6H PRN Beverly Sessions, MD   1 mg at 11/22/17 1646   Or  . LORazepam (ATIVAN) injection 1 mg  1 mg Intramuscular Q6H PRN Beverly Sessions, MD      . magnesium hydroxide (MILK OF MAGNESIA) suspension 30 mL  30 mL Oral Daily PRN Clapacs, John T, MD      . QUEtiapine (SEROQUEL) tablet 300 mg  300 mg Oral QHS McNew, Ileene Hutchinson, MD   300 mg at 11/25/17 2015  . thiamine (VITAMIN B-1) tablet 100 mg  100 mg Oral Daily Beverly Sessions, MD   100 mg at 11/26/17 0819  . traZODone (DESYREL) tablet 100 mg  100 mg Oral QHS PRN He, Jun, MD   100 mg at 11/25/17 2015   PTA  Medications: Medications Prior to Admission  Medication Sig Dispense Refill Last Dose  . Multiple Vitamin (MULTI-VITAMINS) TABS Take 1 tablet by mouth daily.    unknown at unknown  . QUEtiapine (SEROQUEL) 25 MG tablet Take 25 mg by mouth daily.    unknown at unknown  . QUEtiapine (SEROQUEL) 50 MG tablet Take 50 mg by mouth at bedtime.    unknown at unknown  . topiramate (TOPAMAX) 50 MG tablet Take 50 mg by mouth daily.    unknwon at unknown    Patient Stressors: Other: People, buildings, rooms  Patient Strengths: Capable of independent living Physical Health Supportive family/friends  Treatment Modalities: Medication Management, Group therapy, Case management,  1 to 1 session with clinician, Psychoeducation, Recreational therapy.   Physician Treatment Plan for Primary Diagnosis: Unspecified mood (affective) disorder (HCC) Long Term Goal(s): Improvement in symptoms so as ready for discharge Improvement in symptoms so as ready for discharge   Short Term Goals: Ability to identify changes in lifestyle to reduce recurrence of condition will improve Ability to identify changes in lifestyle to reduce recurrence of condition will improve  Medication Management: Evaluate patient's response, side effects, and tolerance of medication regimen.  Therapeutic Interventions: 1 to 1  sessions, Unit Group sessions and Medication administration.  Evaluation of Outcomes: Progressing  Physician Treatment Plan for Secondary Diagnosis: Principal Problem:   Unspecified mood (affective) disorder (HCC) Active Problems:   Alcohol-induced persisting dementia (HCC)  Long Term Goal(s): Improvement in symptoms so as ready for discharge Improvement in symptoms so as ready for discharge   Short Term Goals: Ability to identify changes in lifestyle to reduce recurrence of condition will improve Ability to identify changes in lifestyle to reduce recurrence of condition will improve     Medication Management:  Evaluate patient's response, side effects, and tolerance of medication regimen.  Therapeutic Interventions: 1 to 1 sessions, Unit Group sessions and Medication administration.  Evaluation of Outcomes: Progressing   RN Treatment Plan for Primary Diagnosis: Unspecified mood (affective) disorder (HCC) Long Term Goal(s): Knowledge of disease and therapeutic regimen to maintain health will improve  Short Term Goals: Ability to identify and develop effective coping behaviors will improve and Compliance with prescribed medications will improve  Medication Management: RN will administer medications as ordered by provider, will assess and evaluate patient's response and provide education to patient for prescribed medication. RN will report any adverse and/or side effects to prescribing provider.  Therapeutic Interventions: 1 on 1 counseling sessions, Psychoeducation, Medication administration, Evaluate responses to treatment, Monitor vital signs and CBGs as ordered, Perform/monitor CIWA, COWS, AIMS and Fall Risk screenings as ordered, Perform wound care treatments as ordered.  Evaluation of Outcomes: Progressing   LCSW Treatment Plan for Primary Diagnosis: Unspecified mood (affective) disorder (HCC) Long Term Goal(s): Safe transition to appropriate next level of care at discharge, Engage patient in therapeutic group addressing interpersonal concerns.  Short Term Goals: Engage patient in aftercare planning with referrals and resources, Identify triggers associated with mental health/substance abuse issues and Increase skills for wellness and recovery  Therapeutic Interventions: Assess for all discharge needs, 1 to 1 time with Social worker, Explore available resources and support systems, Assess for adequacy in community support network, Educate family and significant other(s) on suicide prevention, Complete Psychosocial Assessment, Interpersonal group therapy.  Evaluation of Outcomes:  Progressing   Progress in Treatment: Attending groups: As evidenced by:  Pt has been attending mostly the recreational groups.  She has not been very appropriate in other groups offered in the milieu. Participating in groups: As evidenced by:  Pt has only been participating in the recreational groups. Taking medication as prescribed: Yes. Toleration medication: Yes. Family/Significant other contact made: Yes, individual(s) contacted:  CSW has made contact with pt's guardian, Esmeralda Arthur with [employer]. Patient understands diagnosis: No. Discussing patient identified problems/goals with staff: Yes. Medical problems stabilized or resolved: Yes. Denies suicidal/homicidal ideation: Yes. Issues/concerns per patient self-inventory: No. Other:    New problem(s) identified: No, Describe:     New Short Term/Long Term Goal(s):  Patient Goals:  To be discharged  Discharge Plan or Barriers: TBD.  CSW along with her guardian are continuing to explore appropriate residence options.  Pt may be more appropriate to go to an ALF.  Reason for Continuation of Hospitalization: Anxiety Medication stabilization Other; describe Need to identify and secure appropriate residential placement for discharge.  Estimated Length of Stay: 3-5 days  Recreational Therapy: Patient Stressors: N/A Patient Goal: Patient will engage in interactions with peers and staff in pro-social manner at least 2x within 5 recreation therapy group sessions  Attendees: Patient: 11/26/2017 10:14 AM  Physician: Corinna Gab, MD 11/26/2017 10:14 AM  Nursing:  11/26/2017 10:14 AM  RN Care Manager: 11/26/2017  10:14 AM  Social Worker: Huey Romans, LCSW 11/26/2017 10:14 AM  Recreational Therapist: Garret Reddish, LRT 11/26/2017 10:14 AM  Other: Joneen Roach, LCSWA 11/26/2017 10:14 AM  Other:  11/26/2017 10:14 AM  Other: 11/26/2017 10:14 AM    Scribe for Treatment Team: Alease Frame, LCSW 11/26/2017 10:14 AM

## 2017-11-26 NOTE — Progress Notes (Signed)
Patient is calm this evening and compliant with her medications, mood and affect is adequate and bright, encouraged patient to verbalize positive feeling of self and interact more with peers, patient acknowledged  information provided , sleep is continuous without any interruptions, patient denies any SI/HI but endorses depressions 7/10, no distress , 15 minute safety rounding is in progress.

## 2017-11-26 NOTE — Progress Notes (Signed)
[facility] MD Progress Note  11/26/2017 2:14 PM Joanne Johnston  MRN:  [id number] Subjective:  Per RN reports overnight, she was very calm and bright affect. She has not had any outbursts and did take her medications last night. Upon evaluation today, she is calm and appropriate. She is glad to hear about [place] starting to make referrals to group homes. She greatly desires having more independence and not being locked up. She is sleeping well. She did agree to take her medications this morning with encouragement although she does not have insight into why she needs to. She states, "I can manage my outbursts on my own."  She denies SI or HI.   Principal Problem: Unspecified mood (affective) disorder (HCC) Diagnosis:   Patient Active Problem List   Diagnosis Date Noted  . Unspecified mood (affective) disorder (HCC) [F39] 11/18/2017    Priority: High  . 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 Agitation [R45.1] 11/04/2017   Total Time spent with patient: 20 minutes  Past Psychiatric History: See H&p  Past Medical History:  Past Medical History:  Diagnosis Date  . Alcohol dependence (HCC)   . Anxiety   . Dementia   . Depression   . Hypertension   . Tachycardia     Past Surgical History:  Procedure Laterality Date  . CESAREAN SECTION     x2   Family History: History reviewed. No pertinent family history. Family Psychiatric  History: See H&p Social History:  Social History   Substance and Sexual Activity  Alcohol Use Not Currently  . Frequency: Never   Comment: states hasn't had a drink in months     Social History   Substance and Sexual Activity  Drug Use Never    Social History   Socioeconomic History  . Marital status: Divorced    Spouse name: Not on file  . Number of children: Not on file  . Years of education: Not on file  . Highest education level: Not on file  Occupational History  . Not on file  Social Needs  . Financial  resource strain: Not on file  . Food insecurity:    Worry: Not on file    Inability: Not on file  . Transportation needs:    Medical: Not on file    Non-medical: Not on file  Tobacco Use  . Smoking status: Never Smoker  . Smokeless tobacco: Never Used  Substance and Sexual Activity  . Alcohol use: Not Currently    Frequency: Never    Comment: states hasn't had a drink in months  . Drug use: Never  . Sexual activity: Not Currently  Lifestyle  . Physical activity:    Days per week: Not on file    Minutes per session: Not on file  . Stress: Not on file  Relationships  . Social connections:    Talks on phone: Not on file    Gets together: Not on file    Attends religious service: Not on file    Active member of club or organization: Not on file    Attends meetings of clubs or organizations: Not on file    Relationship status: Not on file  Other Topics Concern  . Not on file  Social History Narrative  . Not on file   Additional Social History:                         Sleep: Good  Appetite:  Good  Current Medications: Current Facility-Administered Medications  Medication Dose Route Frequency Provider Last Rate Last Dose  . acetaminophen (TYLENOL) tablet 650 mg  650 mg Oral Q6H PRN Clapacs, John T, MD      . alum & mag hydroxide-simeth (MAALOX/MYLANTA) 200-200-20 MG/5ML suspension 30 mL  30 mL Oral Q4H PRN Clapacs, John T, MD      . benztropine (COGENTIN) tablet 1 mg  1 mg Oral Q6H PRN Beverly Sessions, MD       Or  . benztropine mesylate (COGENTIN) injection 1 mg  1 mg Intramuscular Q6H PRN Beverly Sessions, MD      . carbamazepine (TEGRETOL) tablet 200 mg  200 mg Oral BID McNew, Holly R, MD   200 mg at 11/26/17 1131  . haloperidol (HALDOL) tablet 5 mg  5 mg Oral Q6H PRN Beverly Sessions, MD   5 mg at 11/22/17 1646   Or  . haloperidol lactate (HALDOL) injection 5 mg  5 mg Intramuscular Q6H PRN Beverly Sessions, MD      . LORazepam (ATIVAN) injection 1 mg   1 mg Intramuscular Q6H PRN He, Jun, MD   1 mg at 11/22/17 0208  . LORazepam (ATIVAN) tablet 1 mg  1 mg Oral Q6H PRN Beverly Sessions, MD   1 mg at 11/22/17 1646   Or  . LORazepam (ATIVAN) injection 1 mg  1 mg Intramuscular Q6H PRN Beverly Sessions, MD      . magnesium hydroxide (MILK OF MAGNESIA) suspension 30 mL  30 mL Oral Daily PRN Clapacs, John T, MD      . QUEtiapine (SEROQUEL) tablet 300 mg  300 mg Oral QHS McNew, Ileene Hutchinson, MD   300 mg at 11/25/17 2015  . thiamine (VITAMIN B-1) tablet 100 mg  100 mg Oral Daily Beverly Sessions, MD   100 mg at 11/26/17 0819  . traZODone (DESYREL) tablet 100 mg  100 mg Oral QHS PRN He, Jun, MD   100 mg at 11/25/17 2015    Lab Results: No results found for this or any previous visit (from the past 48 hour(s)).  Blood Alcohol level:  Lab Results  Component Value Date   ETH <10 11/04/2017    Metabolic Disorder Labs: No results found for: HGBA1C, MPG No results found for: PROLACTIN No results found for: CHOL, TRIG, HDL, CHOLHDL, VLDL, LDLCALC  Physical Findings: AIMS: Facial and Oral Movements Muscles of Facial Expression: None, normal Lips and Perioral Area: None, normal Jaw: None, normal Tongue: None, normal,Extremity Movements Upper (arms, wrists, hands, fingers): None, normal Lower (legs, knees, ankles, toes): None, normal, Trunk Movements Neck, shoulders, hips: None, normal, Overall Severity Severity of abnormal movements (highest score from questions above): None, normal Incapacitation due to abnormal movements: None, normal Patient's awareness of abnormal movements (rate only patient's report): No Awareness, Dental Status Current problems with teeth and/or dentures?: No Does patient usually wear dentures?: No  CIWA:    COWS:     Musculoskeletal: Strength & Muscle Tone: within normal limits Gait & Station: normal Patient leans: N/A  Psychiatric Specialty Exam: Physical Exam  Nursing note and vitals reviewed.   Review of  Systems  All other systems reviewed and are negative.   Blood pressure 125/84, pulse 91, temperature 99 F (37.2 C), temperature source Oral, resp. rate 20, height 5\' 8"  (1.727 m), weight 80.7 kg, SpO2 100 %.Body mass index is 27.06 kg/m.  General Appearance: Casual  Eye Contact:  Fair  Speech:  Clear and Coherent  Volume:  Normal  Mood:  Depressed  Affect:  Appropriate  Thought Process:  Coherent and Goal Directed  Orientation:  Full (Time, Place, and Person)  Thought Content:  Logical  Suicidal Thoughts:  No  Homicidal Thoughts:  No  Memory:  Immediate;   Poor  Judgement:  Fair  Insight:  Lacking  Psychomotor Activity:  Normal  Concentration:  Concentration: Fair  Recall:  [employer] of Knowledge:  Fair  Language:  Fair  Akathisia:  No      Assets:  Resilience  ADL's:  Intact  Cognition:  WNL  Sleep:  Number of Hours: 8     Treatment Plan Summary: 47 yo female admitted due to agitation at long term treatment center. She does have some verbaloutburstat timesbut per staff these are much less frequent. She has not had any outbursts last night or today at all. She interacts very appropriate with other peers.  She has not had any physicalaggressionor violence at all. I have been able to have great conversations with her. I have been trying to involve her in her care as much as possibleand she responds very well to this. She expresses her frustration with being locked up in a hospital and most recently in a memory care unit with people not close to her age. She has had some difficulty and frustration with change in independence. MOCA was done on Friday showed some difficulty with delayed recall but overall she did well. She scored 21/30. I feel that a locked memory care unit was too restrictive level of care for her and would benefitmuch more by more independent livingwith some supervisionwhich she is very open to. I strongly feel that a locked memory care unit was much too  restrictive level of care for her and decompensated because of this. This has not worked for her in the past and not likely to bebeneficial for herin the future. She is higher functioning and has been adequately able to care for herself. I wouldn't classifyher memory issues as "severe" and more mild-moderate deficitin short term memory. I think she would benefitmore from an assisted living type environment or group home setting and this would allow her more independenceand allow her to thrive. She could benefit from some supervision of medications.I did speak with herguardianabout thisrecommendation. She has been compliant with her medications overall but does requires some encouragement at times. CSW will begin referrals to group homes which would be a much less restrictive level of care for her but provide some supervision and support. Oubursts are improving and much less frequent.  Plan:  Mood disorder -Continue Seroquel 300 mg qhs -Continue Tegretol 200 mg BID. Level 8.1 and Na 135  Alcohol induced dementia -In my opinion, mild-mod degree of short term memory issues. MOCA 21/30 -Would benefit from group home as opposed to long term locked dementia unit which is much too restrictive for her level of impairment  Dispo -Pt has legal guardian. CSW to begin referrals to group homes  Haskell Riling, MD 11/26/2017, 2:14 PM

## 2017-11-27 NOTE — [facility] Group Notes (Signed)
[facility] Group Notes:  (Nursing/MHT/Case Management/Adjunct)  Date:  11/27/2017  Time:  2:07 AM  Type of Therapy:  Group Therapy  Participation Level:  Did Not Attend   Mayra Neer 11/27/2017, 2:07 AM

## 2017-11-27 NOTE — Progress Notes (Signed)
Recreation Therapy Notes   Date:11/27/17  Time:9:30 am  Location:Craft room  Behavioral response:N/A  Intervention Topic:Problem Solving  Discussion/Intervention: Patient did not attend group.  Clinical Observations/Feedback:  Patient did not attend group.          Gaia Gullikson 11/27/2017 10:29 AM 

## 2017-11-27 NOTE — Plan of Care (Signed)
Patient is stable and alert, ,isolating self to her room, poor socialization and lacking coping skills, irritable with staffs and care givers, compliant with her medicines  . Encouraged to develop positives coping skills and to improve in coping  skills, and  develop social skill with peers, patient acknowledge information provided, no distress,, 15 minute safety rounding is maintained.    Problem: Activity: Goal: Will identify at least one activity in which they can participate Outcome: Progressing   Problem: Coping: Goal: Ability to identify and develop effective coping behavior will improve Outcome: Progressing Goal: Ability to interact with others will improve Outcome: Progressing Goal: Demonstration of participation in decision-making regarding own care will improve Outcome: Progressing Goal: Ability to use eye contact when communicating with others will improve Outcome: Progressing   Problem: Health Behavior/Discharge Planning: Goal: Identification of resources available to assist in meeting health care needs will improve Outcome: Progressing   Problem: Self-Concept: Goal: Will verbalize positive feelings about self Outcome: Progressing   Problem: Safety: Goal: Ability to remain free from injury will improve Outcome: Progressing

## 2017-11-27 NOTE — Progress Notes (Signed)
Received Joanne Johnston this AM, she was encouraged to take her medication,but took it 1435 hrs with the assistance of her doctor. She has been OOB in the milieu with her peers this afternoon without incident.

## 2017-11-27 NOTE — Progress Notes (Signed)
[facility] MD Progress Note  11/27/2017 9:42 AM Rebakah Cokley  MRN:  [id number] Subjective:  Pt was calm and appropriate last night. She took her medications. She is irritable with some staff at times mostly due to frustration with being in the hospital. She is very calm today. She is interacting very well with peers and playing cards. She is being very patient with our effort to find more appropriate placement. Pt was calm and pleasant upon my evaluation today. Discussed with her about our efforts of finding appropriate placement for her which she greatly appreciated. She was very organized in her thoughts and able to have a very rational conversation. She did not get upset or angry at all. Her affect is bright and smiling through interview. She agreed to have PT and OT come to evaluate her to determine need for SNF or more independent living. She did agree to take her medications without issue. She is socializing well outside this afternoon with peers. She is oriented to person, city "[place]" "[place]", [place], month she said "September" but this is a common mistake when one has been in the hospital and oriented to president. She denies SI or HI.   I spoke with her guardian today to discuss plan.CSW spoke with her yesterday to begin referrals to group home placement.  Her guardian did not agree with allowing [place] to pursue group home placement due to her "behaviors." With speaking with her today, she states I've been in touch with other people and let [facility] that she is needing placement. She states that she has placed calls to a few people but has unable to state who she contacted. She states, "people are working behind the scenes." I dicussed her teams recommended of group home or Assisted living facility which is unlocked. Discussed that I feel that level of care is too restrictive for her and may cause a decompensation of her behaviors. She has failed this level of care previous and has not had the chance  to try a lower level of care. She states, "I staffed this with my supervisor and we don't agree with that." However, she is unable to state reasons why they feel she needs a locked facility. I discussed with her that her memory and thinking are much improved and has not had any verbal outbursts in several days. She has not been aggressive at all. I also asked her if she has met Anderson Malta which she reported that she did meet her once while she was hospitalized at [facility] but has not met or talked with her since then. I asked for her to schedule a time to come to the hospital so we could all meet together with Anderson Malta to discuss a plan. She states that she is not able to do this because her case is being switched. I asked again if she could come in for a visit since she is still working on the case and she again said she would not be abel to do this. I discussed that it is not in patient's best interest for decisions to be made by people that have not been able to see or talk with her as she has shown a lot of improvements. I discussed that my strong option is that she does not have a mental health diagnosis and her verbal outbursts are purely behavioral and related to lack of independence and being in a locked hospital and memory care unit since April. She asked if any neuropsych testing has been  done to determine this. Discussed that I am licensed to diagnosis mental health issues. I discussed that she is not manic or psychotic at all.  I did discuss that she does have some cognitive deficits but has shown improvement in this and that a MOCA was performed and scored 21/30 which indicates only mild-moderate memory impairments. She has adequately been caring for herself and her ADLs on the unit. She has not been violent or aggressive at all. Her guardian stated that she would again talk to her supervisor about our recommendations of a group home. I also encouraged her to tell her supervisor to give me a call and  we could discuss if she felt that was appropriate.   I did more chart review from her hospitalization at [facility]. It appears she was very confused at the initial part of hospitalization. PT did evaluation at that time and recommend SNF at that time. Unclear why it progressed into needed locked memory care unit. It also appears the last psychiatric evaluation was in June 2019 and they agreed with diagnosis of Korsakoff syndrome. I did not see any sort of placement recommendation from them other than due to cognitive issues they felt she was at high risk to live independently.   Principal Problem: Unspecified mood (affective) disorder ([place]) Diagnosis:   Patient Active Problem List   Diagnosis Date Noted  . Unspecified mood (affective) disorder ([place]) [F39] 11/18/2017    Priority: High  . 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 Agitation [R45.1] 11/04/2017   Total Time spent with patient: 45 minutes  Past Psychiatric History: See H&P  Past Medical History:  Past Medical History:  Diagnosis Date  . Alcohol dependence ([place])   . Anxiety   . Dementia   . Depression   . Hypertension   . Tachycardia     Past Surgical History:  Procedure Laterality Date  . CESAREAN SECTION     x2   Family History: History reviewed. No pertinent family history. Family Psychiatric  History: See H&P Social History:  Social History   Substance and Sexual Activity  Alcohol Use Not Currently  . Frequency: Never   Comment: states hasn't had a drink in months     Social History   Substance and Sexual Activity  Drug Use Never    Social History   Socioeconomic History  . Marital status: Divorced    Spouse name: Not on file  . Number of children: Not on file  . Years of education: Not on file  . Highest education level: Not on file  Occupational History  . Not on file  Social Needs  . Financial resource strain: Not on file  . Food insecurity:     Worry: Not on file    Inability: Not on file  . Transportation needs:    Medical: Not on file    Non-medical: Not on file  Tobacco Use  . Smoking status: Never Smoker  . Smokeless tobacco: Never Used  Substance and Sexual Activity  . Alcohol use: Not Currently    Frequency: Never    Comment: states hasn't had a drink in months  . Drug use: Never  . Sexual activity: Not Currently  Lifestyle  . Physical activity:    Days per week: Not on file    Minutes per session: Not on file  . Stress: Not on file  Relationships  . Social connections:    Talks on phone: Not on file  Gets together: Not on file    Attends religious service: Not on file    Active member of club or organization: Not on file    Attends meetings of clubs or organizations: Not on file    Relationship status: Not on file  Other Topics Concern  . Not on file  Social History Narrative  . Not on file   Additional Social History:                         Sleep: Good  Appetite:  Good  Current Medications: Current Facility-Administered Medications  Medication Dose Route Frequency Provider Last Rate Last Dose  . acetaminophen (TYLENOL) tablet 650 mg  650 mg Oral Q6H PRN Clapacs, John T, MD      . alum & mag hydroxide-simeth (MAALOX/MYLANTA) 200-200-20 MG/5ML suspension 30 mL  30 mL Oral Q4H PRN Clapacs, John T, MD      . benztropine (COGENTIN) tablet 1 mg  1 mg Oral Q6H PRN Lenward Chancellor, MD       Or  . benztropine mesylate (COGENTIN) injection 1 mg  1 mg Intramuscular Q6H PRN Lenward Chancellor, MD      . carbamazepine (TEGRETOL) tablet 200 mg  200 mg Oral BID Laterrance Nauta R, MD   200 mg at 11/26/17 2131  . haloperidol (HALDOL) tablet 5 mg  5 mg Oral Q6H PRN Lenward Chancellor, MD   5 mg at 11/22/17 1646   Or  . haloperidol lactate (HALDOL) injection 5 mg  5 mg Intramuscular Q6H PRN Lenward Chancellor, MD      . LORazepam (ATIVAN) injection 1 mg  1 mg Intramuscular Q6H PRN He, Jun, MD   1 mg at  11/22/17 0208  . LORazepam (ATIVAN) tablet 1 mg  1 mg Oral Q6H PRN Lenward Chancellor, MD   1 mg at 11/22/17 1646   Or  . LORazepam (ATIVAN) injection 1 mg  1 mg Intramuscular Q6H PRN Lenward Chancellor, MD      . magnesium hydroxide (MILK OF MAGNESIA) suspension 30 mL  30 mL Oral Daily PRN Clapacs, John T, MD      . QUEtiapine (SEROQUEL) tablet 300 mg  300 mg Oral QHS Lannie Heaps, Tyson Babinski, MD   300 mg at 11/26/17 2131  . thiamine (VITAMIN B-1) tablet 100 mg  100 mg Oral Daily Lenward Chancellor, MD   100 mg at 11/26/17 0819  . traZODone (DESYREL) tablet 100 mg  100 mg Oral QHS PRN He, Jun, MD   100 mg at 11/25/17 2015    Lab Results: No results found for this or any previous visit (from the past 48 hour(s)).  Blood Alcohol level:  Lab Results  Component Value Date   ETH <10 84/69/6295    Metabolic Disorder Labs: No results found for: HGBA1C, MPG No results found for: PROLACTIN No results found for: CHOL, TRIG, HDL, CHOLHDL, VLDL, LDLCALC  Physical Findings: AIMS: Facial and Oral Movements Muscles of Facial Expression: None, normal Lips and Perioral Area: None, normal Jaw: None, normal Tongue: None, normal,Extremity Movements Upper (arms, wrists, hands, fingers): None, normal Lower (legs, knees, ankles, toes): None, normal, Trunk Movements Neck, shoulders, hips: None, normal, Overall Severity Severity of abnormal movements (highest score from questions above): None, normal Incapacitation due to abnormal movements: None, normal Patient's awareness of abnormal movements (rate only patient's report): No Awareness, Dental Status Current problems with teeth and/or dentures?: No Does patient usually wear dentures?: No  CIWA:  COWS:     Musculoskeletal: Strength & Muscle Tone: within normal limits Gait & Station: normal Patient leans: N/A  Psychiatric Specialty Exam: Physical Exam  ROS  Blood pressure 125/84, pulse 91, temperature 99 F (37.2 C), temperature source Oral, resp. rate  20, height '5\' 8"'  (1.727 m), weight 80.7 kg, SpO2 100 %.Body mass index is 27.06 kg/m.  General Appearance: Casual  Eye Contact:  Good  Speech:  Clear and Coherent  Volume:  Normal  Mood:  Euthymic  Affect:  Appropriate  Thought Process:  Coherent and Goal Directed  Orientation:  Full (Time, Place, and Person)  Thought Content:  Logical  Suicidal Thoughts:  No  Homicidal Thoughts:  No  Memory:  Immediate;   Fair  Judgement:  Fair  Insight:  Fair  Psychomotor Activity:  Normal  Concentration:  Concentration: Fair  Recall:  [employer] of Knowledge:  Fair  Language:  Fair  Akathisia:  No      Assets:  Resilience  ADL's:  Intact  Cognition:  Impaired,  Mild  Sleep:  Number of Hours: 7.45     Treatment Plan Summary: 47 yo female admitted due to agitation at long term treatment center. She does have some verbaloutburstat times (in regards to requests for vitals and taking medications)but per staff these are much less frequent.She has not had any outbursts last night or today at all. She interacts very appropriate with other peers.She has not had any physicalaggressionor violence at all. I have been able to have great conversations with her. I have been trying to involve her in her care as much as possibleand she responds very well to this. She expresses her frustration with being locked up in a hospital and most recently in a memory care unit with people not close to her age. She has had some difficulty and frustration with change in independence. MOCA was done on 9/27 showed some difficulty with delayed recall but overall she did well. She scored 21/30. I feel that a locked memory care unit was too restrictive level of care for her and would benefitmuch more by more independent livingwith some supervisionwhich she is very open to. I strongly feel that a locked memory care unit was much too restrictive level of care for her and decompensated because of this. This has not worked  for her in the past and not likely to bebeneficial for herin the future. She is higher functioning and has been adequately able to care for herself. I wouldn't classifyher memory issues as "severe" and more mild-moderate deficitin short term memory. I also do not feel that she has an affective or psychotic disorder as she has not displayed any manic or psychotic episodes. I think she would benefitmore from an assisted living type environment or group home setting and this would allow her moreindependenceand allow her to thrive. She could benefit from some supervision of medications.I did speak with herguardianabout thisrecommendation who disagrees with this recommendation. She has been compliant with her medications overall but does requires some encouragement at times.CSW will begin referrals to group homes if guardian allows  which would be a much less restrictive level of care for her but provide some supervision and support. She is also wanting o be closer to [place] where she has some family so CSW will look at group homes in this area. Being closer to support and family may also help her thrive. Outbursts are improving and much less frequent.  Plan:  Mood disorder -Continue Seroquel 300  mg qhs -Continue Tegretol 200 mg BID  Unspecified cognitive impairment -MOCA 21/30 on 9/27. Will repeat again next week -Will  have PT and OT evaluate to see what level of care they would recommend  -Would benefit from group home as opposed to long term locked dementia unit or SNF which is much too restrictive for her level of impairment  Dispo -Pt has legal guardian. Guardian does not agree with group home level of care for unclear reasons. I have requested to speak with her supervisor. I also encouraged guardian to come in for a visit with patient and this provider and CSW but she declined as she stated her case is being transferred soon. Pt did give me permission to reach out to her parents  in [place]. I attempted to call Marden Noble and Vaughan Basta at [phone] but no answer and no option to leave voicemail.   Marylin Crosby, MD 11/27/2017, 9:42 AM

## 2017-11-27 NOTE — Plan of Care (Signed)
Irritable, angry but compliant with treatment

## 2017-11-27 NOTE — Progress Notes (Signed)
Patient is visible in the milieu, mostly in the hallway. In and out of her room. Alert and oriented but guarded and irritable. Not willing to engage in long conversations. Requested to be moved to another room, complaining that "people make too much noise when using the phone". Patient was moved to another room. Took her due medication per encouragements. No additional concern so far. Support and encouragements provided. Safety precautions maintained.

## 2017-11-27 NOTE — [facility] Group Notes (Signed)
LCSW Group Therapy Note  11/27/2017 1:00pm  Type of Therapy/Topic:  Group Therapy:  Balance in Life  Participation Level:  Active  Description of Group:    This group will address the concept of balance and how it feels and looks when one is unbalanced. Patients will be encouraged to process areas in their lives that are out of balance and identify reasons for remaining unbalanced. Facilitators will guide patients in utilizing problem-solving interventions to address and correct the stressor making their life unbalanced. Understanding and applying boundaries will be explored and addressed for obtaining and maintaining a balanced life. Patients will be encouraged to explore ways to assertively make their unbalanced needs known to significant others in their lives, using other group members and facilitator for support and feedback.  Therapeutic Goals: 1. Patient will identify two or more emotions or situations they have that consume much of in their lives. 2. Patient will identify signs/triggers that life has become out of balance:  3. Patient will identify two ways to set boundaries in order to achieve balance in their lives:  4. Patient will demonstrate ability to communicate their needs through discussion and/or role plays  Summary of Patient Progress:  Patient answered all questions and appeared to be OK with sharing in group.    Therapeutic Modalities:   Cognitive Behavioral Therapy Solution-Focused Therapy Assertiveness Training  Vienna Folden M, LCSW 11/27/2017 2:55 PM      LCSW Group Therapy Note  11/27/2017 1:00pm  Type of Therapy/Topic:  Group Therapy:  Balance in Life  Participation Level:  Active  Description of Group:    This group will address the concept of balance and how it feels and looks when one is unbalanced. Patients will be encouraged to process areas in their lives that are out of balance and identify reasons for remaining unbalanced. Facilitators will guide patients in utilizing problem-solving interventions to address and correct the stressor making their life unbalanced. Understanding and applying boundaries will be explored and addressed for obtaining and maintaining a balanced life. Patients will be encouraged to explore ways to assertively make their unbalanced needs known to significant others in their lives, using other group members and facilitator for support and feedback.  Therapeutic Goals: 1. Patient will identify two or more emotions or situations they have that consume much of in their lives. 2. Patient will identify signs/triggers that life has become out of balance:  3. Patient will identify two ways to set boundaries in order to achieve balance in their lives:  4. Patient will demonstrate ability to communicate their needs through discussion and/or role plays  Summary of Patient Progress:  Patient answered all questions and appeared to be OK with sharing in group.    Therapeutic Modalities:   Cognitive Behavioral Therapy Solution-Focused Therapy Assertiveness Training  Cheron Schaumann, [place] 11/27/2017 2:55 PM

## 2017-11-28 NOTE — Evaluation (Signed)
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   PMH includes anxiety, depression, HTN, and tachycardia.      Clinical Impression   Pt is 47 year old female with hx of sever alcohol use with Wernicke Korsakof encephalopathy with hx of anxiety, depression, HTN and tachycardia. She was brought to [facility] from the [facility] (prior to that she was at [facility]) and was aggressive and hx of not taking medications.  She was pleasant and cooperative during session with brief episodes of tearfulness when talking about her family. Per chart, she has a legal guardian. Pt is able to complete grooming, hygiene and all dressing tasks independently.  She has poor short term memory and is not able to state correct month but knew 2019 and thought it was September and stated it was Monday vs Friday.  She presents with ability to recall long term memory like the names and years her children were born but not sure it this is accurate.  She stated that her ex-husband is [place] and she graduated from  [employer] in [place] for [employer].  She stated she graduated from HS in 1996 and used to drink a lot with her boyfriend and then developed memory problems and recognized Korsakoffs and added that it was Wernickes.  She was pleasant and cooperative during assessment but tearful when talking about her family but able to control her emotions and keep participating with re-assurance.  She was able to follow instructions for basic check writing activity and situational problem solving for higher level ADL scenarios and wrote answers legibly.  Based on her behavioral history and poor short term memory issues, she is not safe to live on her own and needs close supervision for medication  management, tracking what day of the week and month and day to day management skills.  Based on her cognitive impairments, rec she would benefit from a facility that provides supervision and safety but where she could receive therapy to help provide strategies to compensate for short term memory loss.  Rec 2-4 sessions of OT to implement cognitive strategies like a calendar in her room that she could track what day or the week, date and month, an easy to follow map of the Behavioral health unit to be able to find the dining hall, medication room, outside patio, activity room with cues to use this to follow and a schedule of activities each day to follow to see if this helps give her a sense of control and awareness.      Follow Up Recommendations  Other (comment)(Rec facility like [facility] that has behavioral care as needed and supervision)    Equipment Recommendations       Recommendations for Other Services       Precautions / Restrictions Precautions Precautions: None Restrictions Weight Bearing Restrictions: No      Mobility Bed Mobility Overal bed mobility: Independent                Transfers Overall transfer level: Independent               General transfer comment: Good eccentric and concentric control and stability during transfers from a low surface    Balance Overall balance assessment: Independent  Standardized Balance Assessment Standardized Balance Assessment : Berg Balance Test;TUG: Timed Up and Go Test Berg Balance Test Sit to Stand: Able to stand without using hands and stabilize independently Standing Unsupported: Able to stand safely 2 minutes Sitting with Back Unsupported but Feet Supported on Floor or Stool: Able to sit safely and securely 2 minutes Stand to Sit: Sits safely with minimal use of hands Transfers: Able to transfer safely, minor use of hands Standing Unsupported with Eyes Closed: Able  to stand 10 seconds safely Standing Ubsupported with Feet Together: Able to place feet together independently and stand 1 minute safely From Standing, Reach Forward with Outstretched Arm: Can reach confidently >25 cm (10") From Standing Position, Pick up Object from Floor: Able to pick up shoe safely and easily From Standing Position, Turn to Look Behind Over each Shoulder: Looks behind from both sides and weight shifts well Turn 360 Degrees: Able to turn 360 degrees safely in 4 seconds or less Standing Unsupported, Alternately Place Feet on Step/Stool: Able to stand independently and safely and complete 8 steps in 20 seconds Standing Unsupported, One Foot in Front: Able to place foot tandem independently and hold 30 seconds Standing on One Leg: Able to lift leg independently and hold > 10 seconds Total Score: 56   Timed Up and Go Test TUG: Normal TUG Normal TUG (seconds): 8   ADL either performed or assessed with clinical judgement   ADL Overall ADL's : At baseline                                       General ADL Comments: Pt is able to complete grooming, hygiene and all dressing tasks independently.  She has poor short term memory and is not able to state correct month but knew 2019 and thought it was September and stated it was Monday vs Friday.  She presents with ability to recall long term memory like the names and years her children were born but not sure it this is accurate.  She stated that her ex-husband is [place] and she graduated from  [employer] in [place] for [employer].  She stated she graduated from HS in 1996 and used to drink a lot with her boyfriend and then developed memory problems and recognized Korsakoffs and added that it was Wernickes.  She was pleasant and cooperative during assessment but tearful when talking about her family but able to control her emotions and keep participating with re-assurance.       Vision Patient Visual Report: No  change from baseline       Perception     Praxis      Pertinent Vitals/Pain Pain Assessment: No/denies pain     Hand Dominance Right   Extremity/Trunk Assessment Upper Extremity Assessment Upper Extremity Assessment: Overall WFL for tasks assessed   Lower Extremity Assessment Lower Extremity Assessment: Defer to PT evaluation       Communication Communication Communication: No difficulties   Cognition Arousal/Alertness: Awake/alert Behavior During Therapy: WFL for tasks assessed/performed Overall Cognitive Status: Impaired/Different from baseline Area of Impairment: Orientation;Memory                 Orientation Level: Disoriented to;Time   Memory: Decreased short-term memory         General Comments: STM deficits per patient and chart with pt stating she was unsure where she was.  Pt was able to follow all  commands during the session including multi-step commands.   General Comments  Five time sit to stand test in 9 seconds    Exercises     Shoulder Instructions      Home Living Family/patient expects to be discharged to:: Unsure Living Arrangements: Other (Comment)                                      Prior Functioning/Environment Level of Independence: Independent        Comments: Has 2 daughters, ages 35 and 27 per pt and her ex-husband is Kandee Keen who is re-married and living in [place] with her daughters.  His parents are supportive and her parents are in [place].        OT Problem List: Decreased cognition      OT Treatment/Interventions: Cognitive remediation/compensation;Patient/family education;Self-care/ADL training    OT Goals(Current goals can be found in the care plan section) Acute Rehab OT Goals Patient Stated Goal: to go back closer to [facility] to see my children OT Goal Formulation: With patient Time For Goal Achievement: 12/12/17 Potential to Achieve Goals: Fair  OT Frequency: Min 1X/week   Barriers to  D/C:            Co-evaluation              AM-PAC PT "6 Clicks" Daily Activity     Outcome Measure Help from another person eating meals?: None Help from another person taking care of personal grooming?: None Help from another person toileting, which includes using toliet, bedpan, or urinal?: None Help from another person bathing (including washing, rinsing, drying)?: None Help from another person to put on and taking off regular upper body clothing?: None Help from another person to put on and taking off regular lower body clothing?: None 6 Click Score: 24   End of Session    Activity Tolerance: Patient tolerated treatment well Patient left: Other (comment)(in dining hall for lunch)  OT Visit Diagnosis: Other (comment)(decreased memory skills)                Time: 1610-9604 OT Time Calculation (min): 45 min Charges:  OT General Charges $OT Visit: 1 Visit OT Evaluation $OT Eval Low Complexity: 1 Low OT Treatments $Self Care/Home Management : 8-22 mins $Cognitive Skills Development: 8-22 mins  Susanne Borders, OTR/L ascom [phone] 11/28/17, 1:03 PM

## 2017-11-28 NOTE — Progress Notes (Signed)
Recreation Therapy Notes  Date: 11/28/2017  Time: 9:30 am   Location: Craft room   Behavioral response: N/A   Intervention Topic: Creative Expressions  Discussion/Intervention: Patient did not attend group.   Clinical Observations/Feedback:  Patient did not attend group.   Glennda Weatherholtz LRT/CTRS        Joanne Johnston 11/28/2017 11:32 AM 

## 2017-11-28 NOTE — Progress Notes (Signed)
[facility] MD Progress Note  11/28/2017 1:52 PM Joanne Johnston  MRN:  [id number] Subjective:  Pt had brief period of confusion this morning. Her room was changed as she complained of the noise being too loud by the phone. She stated this morning she was confused to where she was. She quickly reoriented herself. PT came to see her this morning and she was very willing meet with him. She is calm and appropriate. She is oriented but does have to think of her responses for a brief period of time. She was oriented to " and [facility]" "here because of behavioral issues" "September" (then corrected herself to October) and "2019."  Principal Problem: Unspecified mood (affective) disorder (HCC) Diagnosis:   Patient Active Problem List   Diagnosis Date Noted  . Unspecified mood (affective) disorder (HCC) [F39] 11/18/2017    Priority: High  . 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 Agitation [R45.1] 11/04/2017   Total Time spent with patient: 20 minutes  Past Psychiatric History: See H&P  Past Medical History:  Past Medical History:  Diagnosis Date  . Alcohol dependence (HCC)   . Anxiety   . Dementia   . Depression   . Hypertension   . Tachycardia     Past Surgical History:  Procedure Laterality Date  . CESAREAN SECTION     x2   Family History: History reviewed. No pertinent family history. Family Psychiatric  History: See H&P Social History:  Social History   Substance and Sexual Activity  Alcohol Use Not Currently  . Frequency: Never   Comment: states hasn't had a drink in months     Social History   Substance and Sexual Activity  Drug Use Never    Social History   Socioeconomic History  . Marital status: Divorced    Spouse name: Not on file  . Number of children: Not on file  . Years of education: Not on file  . Highest education level: Not on file  Occupational History  . Not on file  Social Needs  . Financial  resource strain: Not on file  . Food insecurity:    Worry: Not on file    Inability: Not on file  . Transportation needs:    Medical: Not on file    Non-medical: Not on file  Tobacco Use  . Smoking status: Never Smoker  . Smokeless tobacco: Never Used  Substance and Sexual Activity  . Alcohol use: Not Currently    Frequency: Never    Comment: states hasn't had a drink in months  . Drug use: Never  . Sexual activity: Not Currently  Lifestyle  . Physical activity:    Days per week: Not on file    Minutes per session: Not on file  . Stress: Not on file  Relationships  . Social connections:    Talks on phone: Not on file    Gets together: Not on file    Attends religious service: Not on file    Active member of club or organization: Not on file    Attends meetings of clubs or organizations: Not on file    Relationship status: Not on file  Other Topics Concern  . Not on file  Social History Narrative  . Not on file   Additional Social History:                         Sleep: Fair  Appetite:  Fair  Current Medications: Current Facility-Administered Medications  Medication Dose Route Frequency Provider Last Rate Last Dose  . acetaminophen (TYLENOL) tablet 650 mg  650 mg Oral Q6H PRN Clapacs, John T, MD      . alum & mag hydroxide-simeth (MAALOX/MYLANTA) 200-200-20 MG/5ML suspension 30 mL  30 mL Oral Q4H PRN Clapacs, John T, MD      . benztropine (COGENTIN) tablet 1 mg  1 mg Oral Q6H PRN Beverly Sessions, MD       Or  . benztropine mesylate (COGENTIN) injection 1 mg  1 mg Intramuscular Q6H PRN Beverly Sessions, MD      . carbamazepine (TEGRETOL) tablet 200 mg  200 mg Oral BID Adhira Jamil R, MD   200 mg at 11/28/17 0935  . haloperidol (HALDOL) tablet 5 mg  5 mg Oral Q6H PRN Beverly Sessions, MD   5 mg at 11/22/17 1646   Or  . haloperidol lactate (HALDOL) injection 5 mg  5 mg Intramuscular Q6H PRN Beverly Sessions, MD      . LORazepam (ATIVAN) injection 1 mg   1 mg Intramuscular Q6H PRN He, Jun, MD   1 mg at 11/22/17 0208  . LORazepam (ATIVAN) tablet 1 mg  1 mg Oral Q6H PRN Beverly Sessions, MD   1 mg at 11/22/17 1646   Or  . LORazepam (ATIVAN) injection 1 mg  1 mg Intramuscular Q6H PRN Beverly Sessions, MD      . magnesium hydroxide (MILK OF MAGNESIA) suspension 30 mL  30 mL Oral Daily PRN Clapacs, John T, MD      . QUEtiapine (SEROQUEL) tablet 300 mg  300 mg Oral QHS Benji Poynter, Ileene Hutchinson, MD   300 mg at 11/26/17 2131  . thiamine (VITAMIN B-1) tablet 100 mg  100 mg Oral Daily Beverly Sessions, MD   100 mg at 11/28/17 0934  . traZODone (DESYREL) tablet 100 mg  100 mg Oral QHS PRN He, Jun, MD   100 mg at 11/25/17 2015    Lab Results: No results found for this or any previous visit (from the past 48 hour(s)).  Blood Alcohol level:  Lab Results  Component Value Date   ETH <10 11/04/2017    Metabolic Disorder Labs: No results found for: HGBA1C, MPG No results found for: PROLACTIN No results found for: CHOL, TRIG, HDL, CHOLHDL, VLDL, LDLCALC  Physical Findings: AIMS: Facial and Oral Movements Muscles of Facial Expression: None, normal Lips and Perioral Area: None, normal Jaw: None, normal Tongue: None, normal,Extremity Movements Upper (arms, wrists, hands, fingers): None, normal Lower (legs, knees, ankles, toes): None, normal, Trunk Movements Neck, shoulders, hips: None, normal, Overall Severity Severity of abnormal movements (highest score from questions above): None, normal Incapacitation due to abnormal movements: None, normal Patient's awareness of abnormal movements (rate only patient's report): No Awareness, Dental Status Current problems with teeth and/or dentures?: No Does patient usually wear dentures?: No  CIWA:    COWS:     Musculoskeletal: Strength & Muscle Tone: within normal limits Gait & Station: normal Patient leans: N/A  Psychiatric Specialty Exam: Physical Exam  Nursing note and vitals reviewed.   Review of  Systems  All other systems reviewed and are negative.   Blood pressure 125/84, pulse 91, temperature 99 F (37.2 C), temperature source Oral, resp. rate 20, height 5\' 8"  (1.727 m), weight 80.7 kg, SpO2 100 %.Body mass index is 27.06 kg/m.  General Appearance: Casual  Eye Contact:  Good  Speech:  Clear and Coherent  Volume:  Normal  Mood:  Dysphoric  Affect:  Appropriate  Thought Process:  Coherent and Goal Directed  Orientation:  Other:  See above  Thought Content:  Logical  Suicidal Thoughts:  No  Homicidal Thoughts:  No  Memory: Poor short term memory  Judgement:  Impaired  Insight:  Lacking  Psychomotor Activity:  Normal  Concentration:  Concentration: Poor  Recall:  Poor  Fund of Knowledge:  Fair  Language:  Fair  Akathisia:  No      Assets:  Resilience  ADL's:  Intact  Cognition:  Impaired,  Moderate  Sleep:  Number of Hours: 7.5     Treatment Plan Summary: 47 yo female admitted due to agitation at long term treatment center. She does have some verbaloutburstat times (in regards to requests for vitals and taking medications)but per staff these are much less frequent.She has not had any outbursts last night or today at all. She interacts very appropriate with other peers.She has not had any physicalaggressionor violence at all. I have been able to have great conversations with her. I have been trying to involve her in her care as much as possibleand she responds very well to this. She expresses her frustration with being locked up in a hospital and most recently in a memory care unit with people not close to her age. She has had some difficulty and frustration with change in independence. MOCA was done on 9/27 showed some difficulty with delayed recall but overall she did well. She scored 21/30. I feel that a locked memory care unit was too restrictive level of care for her and would benefitmuch more by unlocked facility but with supervisionwhich she is very open to.  I don't feel she can live fully independently at this time due to cognitive deficits.  I strongly feel that a locked memory care unit was much too restrictive level of care for her and decompensated because of this. This has not worked for her in the past and not likely to bebeneficial for herin the future. She is higher functioning and has been adequately able to care for herself. I wouldn't classifyher memory issues as "severe" and more mild-moderate deficitin short term memory. I also do not feel that she has an affective or psychotic disorder as she has not displayed any manic or psychotic episodes. I think she would benefitmore from an assisted living type environment or group home setting and this would allow her moreindependenceand allow her to thrive. She could benefit from some supervision of medications and orientation.I did speak with herguardianabout thisrecommendation who disagrees with this recommendation. She has been compliant with her medicationsoverall but does requires some encouragement at times.CSW will begin referrals to group homes if guardian allows  which would be a much less restrictive level of care for her but provide some supervision and support.She is also wanting to be closer to [place] where she has some family so CSW will look at group homes in this area. Being closer to support and family may also help her thrive. Outbursts are improving and much less frequent  Plan:  Mood disorder -Continue Seroquel 300 mg qhs -Continue Tegretol 200 mg BID  Unspecified Cognitive impairment-alcohol induced -MOCA 21/30 on 9/27 -PT evaluated and felt she was now independent with cares. -OT recommends supervised living due to poor short term memory issues and help with tracking day of the week and day to day management skills. This could be accomplished in an assisted living or groups home living situation  Dispo -Pt has  legal guardian. Guardian does not agree with  group home level of care for unclear reasons. I have requested to speak with her supervisor. I also encouraged guardian to come in for a visit with patient and this provider and CSW but she declined as she stated her case is being transferred soon. Pt did give me permission to reach out to her parents in [place]. I attempted to call Gala Romney and Bonita Quin at [phone] but no answer and no option to leave voicemail.     Haskell Riling, MD 11/28/2017, 1:52 PM

## 2017-11-28 NOTE — [facility] Group Notes (Signed)
  11/28/2017 1:00pm  Type of Therapy and Topic:  Group Therapy:  Feelings around Relapse and Recovery  Participation Level:  Did Not Attend   Description of Group:    Patients in this group will discuss emotions they experience before and after a relapse. They will process how experiencing these feelings, or avoidance of experiencing them, relates to having a relapse. Facilitator will guide patients to explore emotions they have related to recovery. Patients will be encouraged to process which emotions are more powerful. They will be guided to discuss the emotional reaction significant others in their lives may have to their relapse or recovery. Patients will be assisted in exploring ways to respond to the emotions of others without this contributing to a relapse.  Therapeutic Goals: Patient will identify two or more emotions that lead to a relapse for them Patient will identify two emotions that result when they relapse Patient will identify two emotions related to recovery Patient will demonstrate ability to communicate their needs through discussion and/or role plays  Kadir Azucena LCSW 

## 2017-11-28 NOTE — Progress Notes (Signed)
Patient ID: Joanne Johnston, female   DOB: 08/17/1970, 47 y.o.   MRN: [id number] CSW contacted Harl Favor, Care Coordinator with [facility].  CSW discussed pt's discharge plans and barriers faced with securing an appropriate residential placement for her.  Ms. Duffy Rhody informed CSW that she and her co-worker, Pecolia Ades, worked for several months to secure placement when pt was in the hospital for 6 months.  She shared that they could not get her placed any any mental health facility or program, ALF, or [facility].  Ms. Duffy Rhody shared that they searched all of [facility]'s catchment area as well as [place], [place]. They were unable to secure any of these placements.   Ms. Duffy Rhody shared that she was only able to secure a SNF for pt which apparently was not a good fit for her.  CSW informed Ms. Duffy Rhody that pt's guardian with [place] [employer] has stated that she is against pt being placed in an ALF or [facility] and that she is only wanting pt to go to the [facility].  Ms. Duffy Rhody shared that she made a referral to this program and pt was denied admission.  Ms. Duffy Rhody also shared that pt was not going to be able to get special assistance because of the amount she receives in SSI and SSDI benefits per DSS. This would mean that pt would not have any money after the ALF or [facility] is paid.  CSW informed Ms. Duffy Rhody that a lot of the behaviors and issues that she saw or that was reported from hospital staff has not been displayed while pt has been in the BM[facility].  CSW informed Ms. Stanley that pt's attending physician is recommending that pt go into an ALF or [facility] so she can have more independence.  Pt will need some supports if she is going to live more independently.  CSW informed Ms Duffy Rhody that pt has stated that she would like to live closer to her family in [place], [place]. Ms. Duffy Rhody asked if CSW would send her clinical documentation to include pt's FL2, H&P, as well as MD progress notes and that she and her co-worker would  start the process again of looking for a placement.  CSW also asked Ms. Duffy Rhody if she would send a list of all the facilities that she contacted and we could work together to try to secure an appropriate placement.  CSW faxed Ms. Stanley the request documentation for her to review.  CSW will ask CSW Jake Shark to follow-up with Ms. Duffy Rhody next week.

## 2017-11-28 NOTE — [facility] Group Notes (Signed)
[facility] Group Notes:  (Nursing/MHT/Case Management/Adjunct)  Date:  11/28/2017  Time:  9:45 AM  Type of Therapy:  Psychoeducational Skills  Participation Level:  Did Not Attend  Jaylinn Hellenbrand R Doniven Vanpatten 11/28/2017, 9:45 AM 

## 2017-11-28 NOTE — Plan of Care (Signed)
Patient denies SI, HI/AVH. Patient rates pain 0/10. Patient more present in the milieu, more cooperative today, took morning medications without issue. Patient able to interact with others on the unit. Eye contact has improved. No self injurious behaviors  observed. Problem: Activity: Goal: Will identify at least one activity in which they can participate Outcome: Progressing   Problem: Coping: Goal: Ability to identify and develop effective coping behavior will improve Outcome: Progressing Goal: Ability to interact with others will improve Outcome: Progressing Goal: Demonstration of participation in decision-making regarding own care will improve Outcome: Progressing Goal: Ability to use eye contact when communicating with others will improve Outcome: Progressing   Problem: Health Behavior/Discharge Planning: Goal: Identification of resources available to assist in meeting health care needs will improve Outcome: Progressing

## 2017-11-28 NOTE — Evaluation (Signed)
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   PMH includes anxiety, depression, HTN, and tachycardia.       Clinical Impression  Pt presents with no deficits in strength, transfers, mobility, gait, balance, or activity tolerance.  Pt was pleasant and cooperative throughout the session.  Pt was independent with bed mobility and transfers with good speed and control.  Pt was able to ambulate >200 feet without an AD with very good stability and gait speed including during dynamic tasks such as head turns, 180 deg turns, and start/stops.  Pt's SpO2 and HR were WNL with appropriate response to activity.  Pt scored very well on all outcome measures including 56/56 on the Berg Balance Test, 9 sec for the the 5 time sit to stand test, and 8 sec for the Timed Up and Go test, all indicative of low fall risk/independent community ambulation.  Will complete PT orders at this time but will reassess pt pending a change in status upon receipt of new PT orders.       Follow Up Recommendations No PT follow up    Equipment Recommendations  None recommended by PT    Recommendations for Other Services       Precautions / Restrictions Precautions Precautions: None Restrictions Weight Bearing Restrictions: No      Mobility  Bed Mobility Overal bed mobility: Independent                Transfers Overall transfer level: Independent               General transfer comment: Good eccentric and concentric control and stability during transfers from a low surface  Ambulation/Gait Ambulation/Gait assistance: Independent Gait Distance (Feet): 200 Feet Assistive device: None Gait Pattern/deviations: WFL(Within Functional Limits)   Gait velocity interpretation:  >4.37 ft/sec, indicative of normal walking speed    Stairs            Wheelchair Mobility    Modified Rankin (Stroke Patients Only)       Balance Overall balance assessment: Independent                               Standardized Balance Assessment Standardized Balance Assessment : Berg Balance Test;TUG: Timed Up and Go Test Berg Balance Test Sit to Stand: Able to stand without using hands and stabilize independently Standing Unsupported: Able to stand safely 2 minutes Sitting with Back Unsupported but Feet Supported on Floor or Stool: Able to sit safely and securely 2 minutes Stand to Sit: Sits safely with minimal use of hands Transfers: Able to transfer safely, minor use of hands Standing Unsupported with Eyes Closed: Able to stand 10 seconds safely Standing Ubsupported with Feet Together: Able to place feet together independently and stand 1 minute safely From Standing, Reach Forward with Outstretched Arm: Can reach confidently >25 cm (10") From Standing Position, Pick up Object from Floor: Able to pick up shoe safely and easily From Standing Position, Turn to Look Behind Over each Shoulder: Looks behind from both sides and weight shifts well Turn 360 Degrees: Able to turn 360 degrees safely in 4 seconds or less Standing Unsupported, Alternately Place Feet on Step/Stool: Able to stand independently and safely and complete 8 steps in 20 seconds Standing  Unsupported, One Foot in Front: Able to place foot tandem independently and hold 30 seconds Standing on One Leg: Able to lift leg independently and hold > 10 seconds Total Score: 56   Timed Up and Go Test TUG: Normal TUG Normal TUG (seconds): 8     Pertinent Vitals/Pain Pain Assessment: No/denies pain    Home Living Family/patient expects to be discharged to:: Unsure Living Arrangements: Other (Comment)(Unsure)                    Prior Function Level of Independence: Independent                Hand Dominance   Dominant Hand: Right    Extremity/Trunk Assessment   Upper Extremity Assessment Upper Extremity Assessment: Overall WFL for tasks assessed    Lower Extremity Assessment Lower Extremity Assessment: Overall WFL for tasks assessed       Communication   Communication: No difficulties  Cognition Arousal/Alertness: Awake/alert Behavior During Therapy: WFL for tasks assessed/performed Overall Cognitive Status: Difficult to assess                                 General Comments: STM deficits per patient and chart with pt stating she was unsure where she was.  Pt was able to follow all commands during the session including multi-step commands.      General Comments General comments (skin integrity, edema, etc.): Five time sit to stand test in 9 seconds    Exercises     Assessment/Plan    PT Assessment Patent does not need any further PT services  PT Problem List         PT Treatment Interventions      PT Goals (Current goals can be found in the Care Plan section)  Acute Rehab PT Goals PT Goal Formulation: All assessment and education complete, DC therapy    Frequency     Barriers to discharge        Co-evaluation               AM-PAC PT "6 Clicks" Daily Activity  Outcome Measure Difficulty turning over in bed (including adjusting bedclothes, sheets and blankets)?: None Difficulty moving from lying on back to sitting on the side of the bed? : None Difficulty sitting down on and standing up from a chair with arms (e.g., wheelchair, bedside commode, etc,.)?: None Help needed moving to and from a bed to chair (including a wheelchair)?: None Help needed walking in hospital room?: None Help needed climbing 3-5 steps with a railing? : None 6 Click Score: 24    End of Session Equipment Utilized During Treatment: Gait belt Activity Tolerance: Patient tolerated treatment well Patient left: in chair Nurse Communication:  Mobility status PT Visit Diagnosis: Unsteadiness on feet (R26.81);Muscle weakness (generalized) (M62.81)    Time: 1610-9604 PT Time Calculation (min) (ACUTE ONLY): 22 min   Charges:   PT Evaluation $PT Eval Low Complexity: 1 Low          D. Scott Christipher Rieger PT, DPT 11/28/17, 11:13 AM

## 2017-11-29 NOTE — Progress Notes (Signed)
[facility] MD Progress Note  11/29/2017 6:36 PM Joanne Johnston  MRN:  [id number] Subjective:  Pt seen and chart reviewed.  She is quite irritable today. When approach, she said rudely "what do you want?"  She said "I just want to get out of here as soon as possible".  When attempted to assure her that her team is working on the disposition plan, she said "I want to get out of here now!"   However, she did work with our OT today.    Principal Problem: Unspecified mood (affective) disorder (HCC) Diagnosis:   Patient Active Problem List   Diagnosis Date Noted  . Unspecified mood (affective) disorder (HCC) [F39] 11/18/2017  . 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 Agitation [R45.1] 11/04/2017   Total Time spent with patient: 20 minutes  Past Psychiatric History: See H&P  Past Medical History:  Past Medical History:  Diagnosis Date  . Alcohol dependence (HCC)   . Anxiety   . Dementia   . Depression   . Hypertension   . Tachycardia     Past Surgical History:  Procedure Laterality Date  . CESAREAN SECTION     x2   Family History: History reviewed. No pertinent family history. Family Psychiatric  History: See H&P Social History:  Social History   Substance and Sexual Activity  Alcohol Use Not Currently  . Frequency: Never   Comment: states hasn't had a drink in months     Social History   Substance and Sexual Activity  Drug Use Never    Social History   Socioeconomic History  . Marital status: Divorced    Spouse name: Not on file  . Number of children: Not on file  . Years of education: Not on file  . Highest education level: Not on file  Occupational History  . Not on file  Social Needs  . Financial resource strain: Not on file  . Food insecurity:    Worry: Not on file    Inability: Not on file  . Transportation needs:    Medical: Not on file    Non-medical: Not on file  Tobacco Use  . Smoking status: Never Smoker  . Smokeless tobacco:  Never Used  Substance and Sexual Activity  . Alcohol use: Not Currently    Frequency: Never    Comment: states hasn't had a drink in months  . Drug use: Never  . Sexual activity: Not Currently  Lifestyle  . Physical activity:    Days per week: Not on file    Minutes per session: Not on file  . Stress: Not on file  Relationships  . Social connections:    Talks on phone: Not on file    Gets together: Not on file    Attends religious service: Not on file    Active member of club or organization: Not on file    Attends meetings of clubs or organizations: Not on file    Relationship status: Not on file  Other Topics Concern  . Not on file  Social History Narrative  . Not on file   Additional Social History:   Sleep: Fair  Appetite:  Fair  Current Medications: Current Facility-Administered Medications  Medication Dose Route Frequency Provider Last Rate Last Dose  . acetaminophen (TYLENOL) tablet 650 mg  650 mg Oral Q6H PRN Clapacs, John T, MD      . alum & mag hydroxide-simeth (MAALOX/MYLANTA) 200-200-20 MG/5ML suspension 30 mL  30 mL Oral Q4H PRN Clapacs, John  T, MD      . benztropine (COGENTIN) tablet 1 mg  1 mg Oral Q6H PRN Beverly Sessions, MD       Or  . benztropine mesylate (COGENTIN) injection 1 mg  1 mg Intramuscular Q6H PRN Beverly Sessions, MD      . carbamazepine (TEGRETOL) tablet 200 mg  200 mg Oral BID McNew, Holly R, MD   200 mg at 11/29/17 1429  . haloperidol (HALDOL) tablet 5 mg  5 mg Oral Q6H PRN Beverly Sessions, MD   5 mg at 11/22/17 1646   Or  . haloperidol lactate (HALDOL) injection 5 mg  5 mg Intramuscular Q6H PRN Beverly Sessions, MD      . LORazepam (ATIVAN) injection 1 mg  1 mg Intramuscular Q6H PRN Arti Trang, MD   1 mg at 11/22/17 0208  . LORazepam (ATIVAN) tablet 1 mg  1 mg Oral Q6H PRN Beverly Sessions, MD   1 mg at 11/22/17 1646   Or  . LORazepam (ATIVAN) injection 1 mg  1 mg Intramuscular Q6H PRN Beverly Sessions, MD      . magnesium  hydroxide (MILK OF MAGNESIA) suspension 30 mL  30 mL Oral Daily PRN Clapacs, John T, MD      . QUEtiapine (SEROQUEL) tablet 300 mg  300 mg Oral QHS McNew, Ileene Hutchinson, MD   300 mg at 11/28/17 2222  . thiamine (VITAMIN B-1) tablet 100 mg  100 mg Oral Daily Beverly Sessions, MD   100 mg at 11/29/17 1428  . traZODone (DESYREL) tablet 100 mg  100 mg Oral QHS PRN Chrisie Jankovich, MD   100 mg at 11/25/17 2015    Lab Results: No results found for this or any previous visit (from the past 48 hour(s)).  Blood Alcohol level:  Lab Results  Component Value Date   ETH <10 11/04/2017    Metabolic Disorder Labs: No results found for: HGBA1C, MPG No results found for: PROLACTIN No results found for: CHOL, TRIG, HDL, CHOLHDL, VLDL, LDLCALC  Physical Findings: AIMS: Facial and Oral Movements Muscles of Facial Expression: None, normal Lips and Perioral Area: None, normal Jaw: None, normal Tongue: None, normal,Extremity Movements Upper (arms, wrists, hands, fingers): None, normal Lower (legs, knees, ankles, toes): None, normal, Trunk Movements Neck, shoulders, hips: None, normal, Overall Severity Severity of abnormal movements (highest score from questions above): None, normal Incapacitation due to abnormal movements: None, normal Patient's awareness of abnormal movements (rate only patient's report): No Awareness, Dental Status Current problems with teeth and/or dentures?: No Does patient usually wear dentures?: No  CIWA:    COWS:     Musculoskeletal: Strength & Muscle Tone: within normal limits Gait & Station: normal Patient leans: N/A  Psychiatric Specialty Exam: Physical Exam  Nursing note and vitals reviewed.   Review of Systems  All other systems reviewed and are negative.   Blood pressure 125/84, pulse 91, temperature 99 F (37.2 C), temperature source Oral, resp. rate 20, height 5\' 8"  (1.727 m), weight 80.7 kg, SpO2 100 %.Body mass index is 27.06 kg/m.  General Appearance: Casual  Eye  Contact:  Good  Speech:  Clear and Coherent  Volume:  Normal  Mood:  Dysphoric  Affect:  Appropriate  Thought Process:  Coherent and Goal Directed  Orientation:  Other:  See above  Thought Content:  Logical  Suicidal Thoughts:  No  Homicidal Thoughts:  No  Memory: Poor short term memory  Judgement:  Impaired  Insight:  Lacking  Psychomotor Activity:  Normal  Concentration:  Concentration: Poor  Recall:  Poor  Fund of Knowledge:  Fair  Language:  Fair  Akathisia:  No      Assets:  Resilience  ADL's:  Intact  Cognition:  Impaired,  Moderate  Sleep:  Number of Hours: 6.15     Treatment Plan Summary: 47 yo female admitted due to agitation at long term treatment center. She does have some verbaloutburstat times (in regards to requests for vitals and taking medications)but per staff these are much less frequent.She has not had any outbursts last night or today at all. She interacts very appropriate with other peers.She has not had any physicalaggressionor violence at all. I have been able to have great conversations with her. I have been trying to involve her in her care as much as possibleand she responds very well to this. She expresses her frustration with being locked up in a hospital and most recently in a memory care unit with people not close to her age. She has had some difficulty and frustration with change in independence. MOCA was done on 9/27 showed some difficulty with delayed recall but overall she did well. She scored 21/30. I feel that a locked memory care unit was too restrictive level of care for her and would benefitmuch more by unlocked facility but with supervisionwhich she is very open to. I don't feel she can live fully independently at this time due to cognitive deficits.  I strongly feel that a locked memory care unit was much too restrictive level of care for her and decompensated because of this. This has not worked for her in the past and not likely to  bebeneficial for herin the future. She is higher functioning and has been adequately able to care for herself. I wouldn't classifyher memory issues as "severe" and more mild-moderate deficitin short term memory. I also do not feel that she has an affective or psychotic disorder as she has not displayed any manic or psychotic episodes. I think she would benefitmore from an assisted living type environment or group home setting and this would allow her moreindependenceand allow her to thrive. She could benefit from some supervision of medications and orientation.I did speak with herguardianabout thisrecommendation who disagrees with this recommendation. She has been compliant with her medicationsoverall but does requires some encouragement at times.CSW will begin referrals to group homes if guardian allows  which would be a much less restrictive level of care for her but provide some supervision and support.She is also wanting to be closer to [place] where she has some family so CSW will look at group homes in this area. Being closer to support and family may also help her thrive. Outbursts are improving and much less frequent  Plan:  Mood disorder -Continue Seroquel 300 mg qhs -Continue Tegretol 200 mg BID  Unspecified Cognitive impairment-alcohol induced -MOCA 21/30 on 9/27 -PT evaluated and felt she was now independent with cares. -OT recommends supervised living due to poor short term memory issues and help with tracking day of the week and day to day management skills. This could be accomplished in an assisted living or groups home living situation  Dispo -Pt has legal guardian. Guardian does not agree with group home level of care for unclear reasons. I have requested to speak with her supervisor. I also encouraged guardian to come in for a visit with patient and this provider and CSW but she declined as she stated her case is being transferred soon. Pt did give me permission  to  reach out to her parents in [place]. I attempted to call Gala Romney and Bonita Quin at [phone] but no answer and no option to leave voicemail.     Kansas Spainhower, MD 11/29/2017, 6:36 PM

## 2017-11-29 NOTE — Plan of Care (Signed)
Patient did well at the beginning of the shift. Was in the dayroom watching TV and talking to peers. Used the phone and took her 8 pm medication. Patient had a snack then went to her room. Upon medication time, patient was irritable and refused bedtime medication (Seroquel). Patient stated "get out of here, F*n b* ".  Currently sleeping. No sign of distress. Safety precautions maintained.

## 2017-11-29 NOTE — [facility] Group Notes (Signed)
LCSW Group Therapy Note   11/29/2017 1:15pm   Type of Therapy and Topic:  Group Therapy:  Trust and Honesty  Participation Level:  Active  Description of Group:    In this group patients will be asked to explore the value of being honest.  Patients will be guided to discuss their thoughts, feelings, and behaviors related to honesty and trusting in others. Patients will process together how trust and honesty relate to forming relationships with peers, family members, and self. Each patient will be challenged to identify and express feelings of being vulnerable. Patients will discuss reasons why people are dishonest and identify alternative outcomes if one was truthful (to self or others). This group will be process-oriented, with patients participating in exploration of their own experiences, giving and receiving support, and processing challenge from other group members.   Therapeutic Goals: 1. Patient will identify why honesty is important to relationships and how honesty overall affects relationships.  2. Patient will identify a situation where they lied or were lied too and the  feelings, thought process, and behaviors surrounding the situation 3. Patient will identify the meaning of being vulnerable, how that feels, and how that correlates to being honest with self and others. 4. Patient will identify situations where they could have told the truth, but instead lied and explain reasons of dishonesty.   Summary of Patient Progress: The patient scored her mood at a 7 (10 best.) The patient was able to explore the value of being honest.  Patient discussed thoughts, feelings, and behaviors related to honesty and trusting in others. The patient processed together with other group members how trust and honesty relate to forming relationships with peers, family members, and self. Pt actively and appropriately engaged in the group. Patient was able to provide support and validation to other group  members. Patient practiced active listening when interacting with the facilitator and other group members.    Therapeutic Modalities:   Cognitive Behavioral Therapy Solution Focused Therapy Motivational Interviewing Brief Therapy  Monna Crean  [name], LCSW 11/29/2017 11:57 AM

## 2017-11-29 NOTE — Plan of Care (Signed)
Patient is alert, denies SI, HI and AVH. Patient is labile and this afternoon was yelling in her room "I want to get out of here!!" Nurse was able to redirect patient by letting patient know the physicians and team is working hard to find placement. Patient calmed down and said,"OKAY." Patient had no other outburst this shift. 15 minute checks to continue by MHT. No self injurious behaviors observed.

## 2017-11-29 NOTE — Progress Notes (Signed)
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   PMH includes anxiety, depression, HTN, and tachycardia.      OT comments  Pt progressing toward stated goals. Focused session on use of compensatory strategies to facilitate memory to time and situation. Pt presents initially as guarded, with flat affect. Upon some conversation, pt appears with some smiles and is overall pleasant. Pt reports she has most difficulty identifying date/time. Issued pt a blank monthly calendar, pt filled out with min A and VC's. Pt stated year and month correctly, but was unsure/needed reenforcement. For day/date pt says "I could guess but you better just tell me". Had pt fill out calendar for entire month then cross out days up until current date. Encourage this to help orient to each date. Offered pt a detailed weekly schedule, pt says she prefers a month layout. Additionally issued a medication reminder sheet, pt interested in using upon d/c. Educated Engineer, manufacturing on newly administered med sheet, so they are aware if pt chooses to implement it during stay. Continued education on apps pt can use to set medication reminders. Gave pt check writing activity, she states "I have a major in accounting" and filled out exercise with no difficulty. No direct concerns in financial management itself, but with memory associated to complete financial management. Will continue to follow pt 1-2 more sessions to ensure appropriate usage and understanding of compensatory memory aides. D/c plans remain appropriate. Believe pt will best benefit from supported living environment, although pt does not respond well to locked facilities. Group home/assisted living support seems most appropriate this date to ensure safety  with apparent cognitive deficits.    Follow Up Recommendations  Other (comment)(rec supported living facility to manage meds/IADLs/as needed for behaviors)    Equipment Recommendations  Other (comment)(TBD)    Recommendations for Other Services      Precautions / Restrictions Precautions Precautions: None Restrictions Weight Bearing Restrictions: No       Mobility Bed Mobility Overal bed mobility: Independent                Transfers Overall transfer level: Independent                    Balance Overall balance assessment: Independent                                         ADL either performed or assessed with clinical judgement   ADL Overall ADL's : At baseline                                       General ADL Comments: Pt is able to complete all ADLS independently, more issue with memory and recall to safely complete IADLs. Focused session on orientation to time and gave aides to help with memory.     Vision Baseline Vision/History: No visual deficits Patient Visual Report: No change from baseline     Perception     Praxis      Cognition Arousal/Alertness: Awake/alert Behavior During Therapy: WFL for tasks assessed/performed Overall Cognitive Status: Impaired/Different from baseline Area of Impairment: Orientation;Memory  Orientation Level: Disoriented to;Time;Situation   Memory: Decreased short-term memory         General Comments: With with difficulty of recall, time, and also disoriented to situation. Pt able to follow commands during session, but still with difficulty in area of memory        Exercises     Shoulder Instructions       General Comments      Pertinent Vitals/ Pain       Pain Assessment: No/denies pain  Home Living                                          Prior Functioning/Environment              Frequency  Min 1X/week         Progress Toward Goals  OT Goals(current goals can now be found in the care plan section)  Progress towards OT goals: Progressing toward goals  Acute Rehab OT Goals Patient Stated Goal: to move back to [place] OT Goal Formulation: With patient Time For Goal Achievement: 12/12/17 Potential to Achieve Goals: Fair  Plan Discharge plan remains appropriate    Co-evaluation                 AM-PAC PT "6 Clicks" Daily Activity     Outcome Measure   Help from another person eating meals?: None Help from another person taking care of personal grooming?: None Help from another person toileting, which includes using toliet, bedpan, or urinal?: None Help from another person bathing (including washing, rinsing, drying)?: None Help from another person to put on and taking off regular upper body clothing?: None Help from another person to put on and taking off regular lower body clothing?: None 6 Click Score: 24    End of Session    OT Visit Diagnosis: Other symptoms and signs involving cognitive function   Activity Tolerance Patient tolerated treatment well   Patient Left Other (comment)(walking in hall back to room)   Nurse Communication Mobility status        Time: 1530-1540 OT Time Calculation (min): 10 min  Charges: OT General Charges $OT Visit: 1 Visit OT Treatments $Cognitive Skills Development: 8-22 mins  Dalphine Handing, MSOT, OTR/L Behavioral Health OT/ Acute Relief OT   Dalphine Handing 11/29/2017, 3:55 PM

## 2017-11-29 NOTE — Progress Notes (Signed)
Joanne Johnston has been more and more visible in the milieu, showing improvement in behavior.  Sat in the dayroom for a while, talking to peers. Was pleasant on approach but had episodes of minor irritability. Was compliant with HS medications. Had a snack and went to her room at bed time. Currently sleeping and appears to be comfortable in bed. Safety precautions maintained per unit protocol.

## 2017-11-29 NOTE — Progress Notes (Signed)
Patient in room awake. Guarded and irritable upon approach. Refused her bedtime medication (Seroquel). Yelling, cursing. Asking to be left alone "It is time for sleep, isn't it ? Just leave me alone".

## 2017-11-29 NOTE — Plan of Care (Signed)
More visible in the milieu, talking to peers appropriately

## 2017-11-30 NOTE — Progress Notes (Signed)
D- Patient alert and oriented. Patient presents in a pleasant mood on assessment stating that she is feeling ok. Patient allowed this writer to get a set of vitals on her and administer her morning medications. Patient denies SI, HI, AVH, and pain at this time. Patient also denies depression and anxiety to this Clinical research associate. Patient has no stated goals for today.  A- Scheduled medications administered to patient, per MD orders. Support and encouragement provided.  Routine safety checks conducted every 15 minutes.  Patient informed to notify staff with problems or concerns.  R- No adverse drug reactions noted. Patient contracts for safety at this time. Patient compliant with medications and treatment plan. Patient receptive, calm, and cooperative. Patient interacts well with others on the unit.  Patient remains safe at this time.

## 2017-11-30 NOTE — Progress Notes (Signed)
Patient slept fine but woke. Upon arousal, patient started shouting, yelling for no reason. "get out of my or I will f* kill you". Guarded and refusing vital signs.

## 2017-11-30 NOTE — Progress Notes (Signed)
[facility] MD Progress Note  11/30/2017 2:48 PM Joanne Johnston  MRN:  [id number] Subjective:  Pt seen and chart reviewed.  She is still very irritable, told the RN to "get out of my room".  She told the writer "there is nothing you can do, I just want to get out of this sh*t place!"  She also refused her AM meds today.  Overall, she is redirectable.    Principal Problem: Unspecified mood (affective) disorder (HCC) Diagnosis:   Patient Active Problem List   Diagnosis Date Noted  . Unspecified mood (affective) disorder (HCC) [F39] 11/18/2017  . 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 Agitation [R45.1] 11/04/2017   Total Time spent with patient: 20 minutes  Past Psychiatric History: See H&P  Past Medical History:  Past Medical History:  Diagnosis Date  . Alcohol dependence (HCC)   . Anxiety   . Dementia   . Depression   . Hypertension   . Tachycardia     Past Surgical History:  Procedure Laterality Date  . CESAREAN SECTION     x2   Family History: History reviewed. No pertinent family history. Family Psychiatric  History: See H&P Social History:  Social History   Substance and Sexual Activity  Alcohol Use Not Currently  . Frequency: Never   Comment: states hasn't had a drink in months     Social History   Substance and Sexual Activity  Drug Use Never    Social History   Socioeconomic History  . Marital status: Divorced    Spouse name: Not on file  . Number of children: Not on file  . Years of education: Not on file  . Highest education level: Not on file  Occupational History  . Not on file  Social Needs  . Financial resource strain: Not on file  . Food insecurity:    Worry: Not on file    Inability: Not on file  . Transportation needs:    Medical: Not on file    Non-medical: Not on file  Tobacco Use  . Smoking status: Never Smoker  . Smokeless tobacco: Never Used  Substance and Sexual Activity  . Alcohol use: Not Currently     Frequency: Never    Comment: states hasn't had a drink in months  . Drug use: Never  . Sexual activity: Not Currently  Lifestyle  . Physical activity:    Days per week: Not on file    Minutes per session: Not on file  . Stress: Not on file  Relationships  . Social connections:    Talks on phone: Not on file    Gets together: Not on file    Attends religious service: Not on file    Active member of club or organization: Not on file    Attends meetings of clubs or organizations: Not on file    Relationship status: Not on file  Other Topics Concern  . Not on file  Social History Narrative  . Not on file   Additional Social History:   Sleep: Fair  Appetite:  Fair  Current Medications: Current Facility-Administered Medications  Medication Dose Route Frequency Provider Last Rate Last Dose  . acetaminophen (TYLENOL) tablet 650 mg  650 mg Oral Q6H PRN Clapacs, John T, MD      . alum & mag hydroxide-simeth (MAALOX/MYLANTA) 200-200-20 MG/5ML suspension 30 mL  30 mL Oral Q4H PRN Clapacs, John T, MD      . benztropine (COGENTIN) tablet 1 mg  1 mg  Oral Q6H PRN Beverly Sessions, MD       Or  . benztropine mesylate (COGENTIN) injection 1 mg  1 mg Intramuscular Q6H PRN Beverly Sessions, MD      . carbamazepine (TEGRETOL) tablet 200 mg  200 mg Oral BID McNew, Ileene Hutchinson, MD   200 mg at 11/29/17 1955  . haloperidol (HALDOL) tablet 5 mg  5 mg Oral Q6H PRN Beverly Sessions, MD   5 mg at 11/22/17 1646   Or  . haloperidol lactate (HALDOL) injection 5 mg  5 mg Intramuscular Q6H PRN Beverly Sessions, MD      . LORazepam (ATIVAN) injection 1 mg  1 mg Intramuscular Q6H PRN Rumaisa Schnetzer, MD   1 mg at 11/22/17 0208  . LORazepam (ATIVAN) tablet 1 mg  1 mg Oral Q6H PRN Beverly Sessions, MD   1 mg at 11/22/17 1646   Or  . LORazepam (ATIVAN) injection 1 mg  1 mg Intramuscular Q6H PRN Beverly Sessions, MD      . magnesium hydroxide (MILK OF MAGNESIA) suspension 30 mL  30 mL Oral Daily PRN Clapacs, John T,  MD      . QUEtiapine (SEROQUEL) tablet 300 mg  300 mg Oral QHS McNew, Ileene Hutchinson, MD   300 mg at 11/28/17 2222  . thiamine (VITAMIN B-1) tablet 100 mg  100 mg Oral Daily Beverly Sessions, MD   100 mg at 11/29/17 1428  . traZODone (DESYREL) tablet 100 mg  100 mg Oral QHS PRN Khyler Eschmann, MD   100 mg at 11/25/17 2015    Lab Results: No results found for this or any previous visit (from the past 48 hour(s)).  Blood Alcohol level:  Lab Results  Component Value Date   ETH <10 11/04/2017    Metabolic Disorder Labs: No results found for: HGBA1C, MPG No results found for: PROLACTIN No results found for: CHOL, TRIG, HDL, CHOLHDL, VLDL, LDLCALC  Physical Findings: AIMS: Facial and Oral Movements Muscles of Facial Expression: None, normal Lips and Perioral Area: None, normal Jaw: None, normal Tongue: None, normal,Extremity Movements Upper (arms, wrists, hands, fingers): None, normal Lower (legs, knees, ankles, toes): None, normal, Trunk Movements Neck, shoulders, hips: None, normal, Overall Severity Severity of abnormal movements (highest score from questions above): None, normal Incapacitation due to abnormal movements: None, normal Patient's awareness of abnormal movements (rate only patient's report): No Awareness, Dental Status Current problems with teeth and/or dentures?: No Does patient usually wear dentures?: No  CIWA:    COWS:     Musculoskeletal: Strength & Muscle Tone: within normal limits Gait & Station: normal Patient leans: N/A  Psychiatric Specialty Exam: Physical Exam  Nursing note and vitals reviewed.   Review of Systems  All other systems reviewed and are negative.   Blood pressure 125/84, pulse 91, temperature 99 F (37.2 C), temperature source Oral, resp. rate 20, height 5\' 8"  (1.727 m), weight 80.7 kg, SpO2 100 %.Body mass index is 27.06 kg/m.  General Appearance: Casual  Eye Contact:  Good  Speech:  Clear and Coherent  Volume:  Normal  Mood:  Dysphoric   Affect:  Appropriate  Thought Process:  Coherent and Goal Directed  Orientation:  Other:  See above  Thought Content:  Logical  Suicidal Thoughts:  No  Homicidal Thoughts:  No  Memory: Poor short term memory  Judgement:  Impaired  Insight:  Lacking  Psychomotor Activity:  Normal  Concentration:  Concentration: Poor  Recall:  Poor  Fund of Knowledge:  Fair  Language:  Fair  Akathisia:  No      Assets:  Resilience  ADL's:  Intact  Cognition:  Impaired,  Moderate  Sleep:  Number of Hours: 6     Treatment Plan Summary: 47 yo female admitted due to agitation at long term treatment center. She does have some verbaloutburstat times (in regards to requests for vitals and taking medications)but per staff these are much less frequent.She has not had any outbursts last night or today at all. She interacts very appropriate with other peers.She has not had any physicalaggressionor violence at all. I have been able to have great conversations with her. I have been trying to involve her in her care as much as possibleand she responds very well to this. She expresses her frustration with being locked up in a hospital and most recently in a memory care unit with people not close to her age. She has had some difficulty and frustration with change in independence. MOCA was done on 9/27 showed some difficulty with delayed recall but overall she did well. She scored 21/30. I feel that a locked memory care unit was too restrictive level of care for her and would benefitmuch more by unlocked facility but with supervisionwhich she is very open to. I don't feel she can live fully independently at this time due to cognitive deficits.  I strongly feel that a locked memory care unit was much too restrictive level of care for her and decompensated because of this. This has not worked for her in the past and not likely to bebeneficial for herin the future. She is higher functioning and has been adequately  able to care for herself. I wouldn't classifyher memory issues as "severe" and more mild-moderate deficitin short term memory. I also do not feel that she has an affective or psychotic disorder as she has not displayed any manic or psychotic episodes. I think she would benefitmore from an assisted living type environment or group home setting and this would allow her moreindependenceand allow her to thrive. She could benefit from some supervision of medications and orientation.I did speak with herguardianabout thisrecommendation who disagrees with this recommendation. She has been compliant with her medicationsoverall but does requires some encouragement at times.CSW will begin referrals to group homes if guardian allows  which would be a much less restrictive level of care for her but provide some supervision and support.She is also wanting to be closer to [place] where she has some family so CSW will look at group homes in this area. Being closer to support and family may also help her thrive. Outbursts are improving and much less frequent  Plan:  Mood disorder -Continue Seroquel 300 mg qhs -Continue Tegretol 200 mg BID  Unspecified Cognitive impairment-alcohol induced -MOCA 21/30 on 9/27 -PT evaluated and felt she was now independent with cares. -OT recommends supervised living due to poor short term memory issues and help with tracking day of the week and day to day management skills. This could be accomplished in an assisted living or groups home living situation  Dispo -Pt has legal guardian. Guardian does not agree with group home level of care for unclear reasons. Dr. Johnella Moloney has requested to speak with her supervisor. Dr. Johnella Moloney encouraged guardian to come in for a visit with patient and this provider and CSW but she declined as she stated her case is being transferred soon. Pt did give Dr. Johnella Moloney permission to reach out to her parents in [place]. Dr. Johnella Moloney attempted to call  Gala Romney and  Bonita Quin at [phone] but no answer and no option to leave voicemail.  - While I agree that memory care is not appropriate for this patient, I also worried that with her agitation and irritable attitude, it would be different to find a GH that would accept her and keep her for longer-term.  It is a challenging decision.    Lenell Lama, MD 11/30/2017, 2:48 PM

## 2017-11-30 NOTE — [facility] Group Notes (Signed)
[facility] Group Notes:  (Nursing/MHT/Case Management/Adjunct)  Date:  11/30/2017  Time:  10:15 PM  Type of Therapy:  Group Therapy  Participation Level:  Active  Participation Quality:  Appropriate  Affect:  Appropriate  Cognitive:  Appropriate  Insight:  Appropriate  Engagement in Group:  Engaged  Modes of Intervention:  Discussion  Summary of Progress/Problems: Joanne Johnston was pleasant during group. Joanne Johnston made a joke about the discussion Joanne Johnston did not voice any concerns about routine checks throughout the night. Joanne Johnston regularly yells and screams when MHT's go in to do routine checks. MHT went over the rules and expectations of the unit. MHT reviewed visitation and call hours. MHT informed patients not to go in other patient's rooms. MHT informed patients not to share or loan out clothes MHT informed patients not to share personal information or use last names while on the unit. MHT informed patients of routine checks throughout the night. MHT encouraged patients to clean up after themselves MHT processed with patients about effective communication with doctors.  Jinger Neighbors 11/30/2017, 10:15 PM

## 2017-11-30 NOTE — [facility] Group Notes (Signed)
LCSW Group Therapy Note 11/30/2017 1:15pm  Type of Therapy and Topic: Group Therapy: Feelings Around Returning Home & Establishing a Supportive Framework and Supporting Oneself When Supports Not Available  Participation Level: Active  Description of Group:  Patients first processed thoughts and feelings about upcoming discharge. These included fears of upcoming changes, lack of change, new living environments, judgements and expectations from others and overall stigma of mental health issues. The group then discussed the definition of a supportive framework, what that looks and feels like, and how do to discern it from an unhealthy non-supportive network. The group identified different types of supports as well as what to do when your family/friends are less than helpful or unavailable  Therapeutic Goals  1. Patient will identify one healthy supportive network that they can use at discharge. 2. Patient will identify one factor of a supportive framework and how to tell it from an unhealthy network. 3. Patient able to identify one coping skill to use when they do not have positive supports from others. 4. Patient will demonstrate ability to communicate their needs through discussion and/or role plays.  Summary of Patient Progress:  The patient reported she feels "fine." Pt engaged during group session. As patients processed their anxiety about discharge and described healthy supports patient shared that she is ready to be discharge. She states, "I work through some issues." Patient listed her family as her main support.  Patients identified at least one self-care tool they were willing to use after discharge; taking deep breath and not reacting to difficult situations.   Therapeutic Modalities Cognitive Behavioral Therapy Motivational Interviewing   Joanne Johnston  [name], LCSW 11/30/2017 11:39 AM

## 2017-11-30 NOTE — Plan of Care (Signed)
Patient slept majority of the morning, but has been observed out in the milieu this afternoon and went outside in the courtyard with staff and other members on the unit. Patient also attended/participated in social work group without any issues. Patient has the ability to make decisions regarding her care and has used fair eye contact while communicating with staff and other. Patient has the ability to identify the available resources that can assist her in meeting her health-care needs, however, she has not voiced anything to this Clinical research associate. Patient had no stated goals for today. Patient has been free from injury and remains safe on the unit at this time.  Problem: Activity: Goal: Will identify at least one activity in which they can participate Outcome: Progressing   Problem: Coping: Goal: Ability to identify and develop effective coping behavior will improve Outcome: Progressing Goal: Ability to interact with others will improve Outcome: Progressing Goal: Demonstration of participation in decision-making regarding own care will improve Outcome: Progressing Goal: Ability to use eye contact when communicating with others will improve Outcome: Progressing   Problem: Health Behavior/Discharge Planning: Goal: Identification of resources available to assist in meeting health care needs will improve Outcome: Progressing   Problem: Self-Concept: Goal: Will verbalize positive feelings about self Outcome: Progressing   Problem: Safety: Goal: Ability to remain free from injury will improve Outcome: Progressing

## 2017-11-30 NOTE — Progress Notes (Signed)
Patient has been yelling at staff stating "get the hell out of my room", so this writer is going to revisit administering morning medications later on this morning once patient has calmed down. This Clinical research associate will notify MD.

## 2017-12-01 NOTE — Tx Team (Signed)
Interdisciplinary Treatment and Diagnostic Plan Update  12/01/2017 Time of Session: 10:00  am Joanne Johnston MRN: [id number]  Principal Diagnosis: Unspecified mood (affective) disorder (HCC)  Secondary Diagnoses: Principal Problem:   Unspecified mood (affective) disorder (HCC) Active Problems:   Alcohol-induced persisting dementia (HCC)   Current Medications:  Current Facility-Administered Medications  Medication Dose Route Frequency Provider Last Rate Last Dose  . acetaminophen (TYLENOL) tablet 650 mg  650 mg Oral Q6H PRN Clapacs, John T, MD      . alum & mag hydroxide-simeth (MAALOX/MYLANTA) 200-200-20 MG/5ML suspension 30 mL  30 mL Oral Q4H PRN Clapacs, John T, MD      . benztropine (COGENTIN) tablet 1 mg  1 mg Oral Q6H PRN Beverly Sessions, MD       Or  . benztropine mesylate (COGENTIN) injection 1 mg  1 mg Intramuscular Q6H PRN Beverly Sessions, MD      . carbamazepine (TEGRETOL) tablet 200 mg  200 mg Oral BID McNew, Holly R, MD   200 mg at 12/01/17 4098  . haloperidol (HALDOL) tablet 5 mg  5 mg Oral Q6H PRN Beverly Sessions, MD   5 mg at 11/22/17 1646   Or  . haloperidol lactate (HALDOL) injection 5 mg  5 mg Intramuscular Q6H PRN Beverly Sessions, MD      . LORazepam (ATIVAN) injection 1 mg  1 mg Intramuscular Q6H PRN He, Jun, MD   1 mg at 11/22/17 0208  . LORazepam (ATIVAN) tablet 1 mg  1 mg Oral Q6H PRN Beverly Sessions, MD   1 mg at 11/22/17 1646   Or  . LORazepam (ATIVAN) injection 1 mg  1 mg Intramuscular Q6H PRN Beverly Sessions, MD      . magnesium hydroxide (MILK OF MAGNESIA) suspension 30 mL  30 mL Oral Daily PRN Clapacs, John T, MD      . QUEtiapine (SEROQUEL) tablet 300 mg  300 mg Oral QHS McNew, Ileene Hutchinson, MD   300 mg at 11/30/17 2300  . thiamine (VITAMIN B-1) tablet 100 mg  100 mg Oral Daily Beverly Sessions, MD   100 mg at 12/01/17 0829  . traZODone (DESYREL) tablet 100 mg  100 mg Oral QHS PRN He, Jun, MD   100 mg at 11/25/17 2015   PTA  Medications: Medications Prior to Admission  Medication Sig Dispense Refill Last Dose  . [EXPIRED] Multiple Vitamin (MULTI-VITAMINS) TABS Take 1 tablet by mouth daily.    unknown at unknown  . [EXPIRED] QUEtiapine (SEROQUEL) 25 MG tablet Take 25 mg by mouth daily.    unknown at unknown  . [EXPIRED] QUEtiapine (SEROQUEL) 50 MG tablet Take 50 mg by mouth at bedtime.    unknown at unknown  . topiramate (TOPAMAX) 50 MG tablet Take 50 mg by mouth daily.    unknwon at unknown    Patient Stressors: Other: People, buildings, rooms  Patient Strengths: Capable of independent living Physical Health Supportive family/friends  Treatment Modalities: Medication Management, Group therapy, Case management,  1 to 1 session with clinician, Psychoeducation, Recreational therapy.   Physician Treatment Plan for Primary Diagnosis: Unspecified mood (affective) disorder (HCC) Long Term Goal(s): Improvement in symptoms so as ready for discharge Improvement in symptoms so as ready for discharge   Short Term Goals: Ability to identify changes in lifestyle to reduce recurrence of condition will improve Ability to identify changes in lifestyle to reduce recurrence of condition will improve  Medication Management: Evaluate patient's response, side effects, and tolerance of medication regimen.  Therapeutic Interventions:  1 to 1 sessions, Unit Group sessions and Medication administration.  Evaluation of Outcomes: Progressing  Physician Treatment Plan for Secondary Diagnosis: Principal Problem:   Unspecified mood (affective) disorder (HCC) Active Problems:   Alcohol-induced persisting dementia (HCC)  Long Term Goal(s): Improvement in symptoms so as ready for discharge Improvement in symptoms so as ready for discharge   Short Term Goals: Ability to identify changes in lifestyle to reduce recurrence of condition will improve Ability to identify changes in lifestyle to reduce recurrence of condition will improve      Medication Management: Evaluate patient's response, side effects, and tolerance of medication regimen.  Therapeutic Interventions: 1 to 1 sessions, Unit Group sessions and Medication administration.  Evaluation of Outcomes: Progressing   RN Treatment Plan for Primary Diagnosis: Unspecified mood (affective) disorder (HCC) Long Term Goal(s): Knowledge of disease and therapeutic regimen to maintain health will improve  Short Term Goals: Ability to identify and develop effective coping behaviors will improve and Compliance with prescribed medications will improve  Medication Management: RN will administer medications as ordered by provider, will assess and evaluate patient's response and provide education to patient for prescribed medication. RN will report any adverse and/or side effects to prescribing provider.  Therapeutic Interventions: 1 on 1 counseling sessions, Psychoeducation, Medication administration, Evaluate responses to treatment, Monitor vital signs and CBGs as ordered, Perform/monitor CIWA, COWS, AIMS and Fall Risk screenings as ordered, Perform wound care treatments as ordered.  Evaluation of Outcomes: Progressing   LCSW Treatment Plan for Primary Diagnosis: Unspecified mood (affective) disorder (HCC) Long Term Goal(s): Safe transition to appropriate next level of care at discharge, Engage patient in therapeutic group addressing interpersonal concerns.  Short Term Goals: Engage patient in aftercare planning with referrals and resources, Identify triggers associated with mental health/substance abuse issues and Increase skills for wellness and recovery  Therapeutic Interventions: Assess for all discharge needs, 1 to 1 time with Social worker, Explore available resources and support systems, Assess for adequacy in community support network, Educate family and significant other(s) on suicide prevention, Complete Psychosocial Assessment, Interpersonal group  therapy.  Evaluation of Outcomes: Progressing   Progress in Treatment: Attending groups: As evidenced by:  Pt has been attending mostly the recreational groups.  She has not been very appropriate in other groups offered in the milieu. Participating in groups: As evidenced by:  Pt has only been participating in the recreational groups. Taking medication as prescribed: Yes. Toleration medication: Yes. Family/Significant other contact made: Yes, individual(s) contacted:  CSW has made contact with pt's guardian, Esmeralda Arthur with [employer]. Patient understands diagnosis: No. Discussing patient identified problems/goals with staff: Yes. Medical problems stabilized or resolved: Yes. Denies suicidal/homicidal ideation: Yes. Issues/concerns per patient self-inventory: No. Other:    New problem(s) identified: No, Describe:     New Short Term/Long Term Goal(s):  Patient Goals:  To be discharged  Discharge Plan or Barriers: TBD.  CSW along with her guardian are continuing to explore appropriate residence options.  Pt may be more appropriate to go to an ALF. Guardian states that [facility] is considering her and CSW sent her additional information that they requested 10/7.  Reason for Continuation of Hospitalization: Anxiety Medication stabilization Other; describe Need to identify and secure appropriate residential placement for discharge.  Estimated Length of Stay: 3-5 days  Recreational Therapy: Patient Stressors: N/A Patient Goal: Patient will engage in interactions with peers and staff in pro-social manner at least 2x within 5 recreation therapy group sessions  Attendees: Patient:  12/01/2017 5:19 PM  Physician: Corinna Gab, MD 12/01/2017 5:19 PM  Nursing:  12/01/2017 5:19 PM  RN Care Manager: 12/01/2017 5:19 PM  Social Worker: Jake Shark, LCSW 12/01/2017 5:19 PM  Recreational Therapist: Garret Reddish, LRT 12/01/2017 5:19 PM  Other: Joneen Roach, LCSWA 12/01/2017 5:19  PM  Other:  12/01/2017 5:19 PM  Other: 12/01/2017 5:19 PM    Scribe for Treatment Team: Glennon Mac, LCSW 12/01/2017 5:19 PM

## 2017-12-01 NOTE — [facility] Group Notes (Signed)
[facility] Group Notes:  (Nursing/MHT/Case Management/Adjunct)  Date:  12/01/2017  Time:  10:45 PM  Type of Therapy:  Group Therapy  Participation Level:  Active  Participation Quality:  Appropriate  Affect:  Appropriate  Cognitive:  Appropriate  Insight:  Appropriate  Engagement in Group:  Engaged  Modes of Intervention:  Discussion  Summary of Progress/Problems: Navayah attended group. Victorino Dike participated on this day. Shawnette stated her goal was to reach a friend on this day and accomplished this. Choua did not complete her self inventory sheet. MHT reviewed rules and expectations of the unit. MHT processed with patients about making routine checks throughout the night. MHT encouraged patients to complete self-inventory sheet. MHT processed with patients about seeking outpatient treatment when discharged from the unit. MHT explained the expected benefits of working with a mental health provider in the community. MHT encouraged patients to comply with doctor recommendations and follow up appointments.  Jinger Neighbors 12/01/2017, 10:45 PM

## 2017-12-01 NOTE — Progress Notes (Signed)
Patient ID: Joanne Johnston, female   DOB: 05/05/1970, 47 y.o.   MRN: [id number] DAR Note: Pt continue to be confused and worried, "I don't remember if my family even know that I'm here or know." Pt at assessment endorsed moderate depression; Pt was observed crying while getting her medication. Pt denied pain, SI/HI or AVA. All Pt's questions and concerns addressed. Pt does not look to be in any acute distress. Support, encouragement, and safe environment provided. Pt was med compliant. 15-minute safety checks continue.

## 2017-12-01 NOTE — Progress Notes (Signed)
[facility] MD Progress Note  12/01/2017 11:40 AM Kamilah Correia  MRN:  [id number] Subjective:  Pt is sitting in her room staring at the wall. She looks more depressed today. She states that she feels upset about being in the hospital. She was calm and rational when we talked today. Reported that everyone on her team is working very hard for her to find placement for her. She states taht she is not sure if her family knows she is here. Discussed with her that she has been on the phone talking with family members through her hospitalization. She did become irritable later in the morning stating that she wanted to get on Facebook to find her parents phone number. She was yelling "my family doesn't know where I am! I guess I'll never talk to them again." She went to her room and calmed down shortly after. She did not require any prn medications. She later was sitting calmly in the day room with other peers.  She did work with Occupational therapy over the weekend. They are working with her on things to help with her short term memory. Pt is fully oriented this morning to person, place, city, state and month and year. She is taking her medications.   Principal Problem: Unspecified mood (affective) disorder (HCC) Diagnosis:   Patient Active Problem List   Diagnosis Date Noted  . Unspecified mood (affective) disorder (HCC) [F39] 11/18/2017    Priority: High  . 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 Agitation [R45.1] 11/04/2017   Total Time spent with patient: 20 minutes  Past Psychiatric History: See H&P  Past Medical History:  Past Medical History:  Diagnosis Date  . Alcohol dependence (HCC)   . Anxiety   . Dementia   . Depression   . Hypertension   . Tachycardia     Past Surgical History:  Procedure Laterality Date  . CESAREAN SECTION     x2   Family History: History reviewed. No pertinent family history. Family Psychiatric  History: See H&P Social  History:  Social History   Substance and Sexual Activity  Alcohol Use Not Currently  . Frequency: Never   Comment: states hasn't had a drink in months     Social History   Substance and Sexual Activity  Drug Use Never    Social History   Socioeconomic History  . Marital status: Divorced    Spouse name: Not on file  . Number of children: Not on file  . Years of education: Not on file  . Highest education level: Not on file  Occupational History  . Not on file  Social Needs  . Financial resource strain: Not on file  . Food insecurity:    Worry: Not on file    Inability: Not on file  . Transportation needs:    Medical: Not on file    Non-medical: Not on file  Tobacco Use  . Smoking status: Never Smoker  . Smokeless tobacco: Never Used  Substance and Sexual Activity  . Alcohol use: Not Currently    Frequency: Never    Comment: states hasn't had a drink in months  . Drug use: Never  . Sexual activity: Not Currently  Lifestyle  . Physical activity:    Days per week: Not on file    Minutes per session: Not on file  . Stress: Not on file  Relationships  . Social connections:    Talks on phone: Not on file  Gets together: Not on file    Attends religious service: Not on file    Active member of club or organization: Not on file    Attends meetings of clubs or organizations: Not on file    Relationship status: Not on file  Other Topics Concern  . Not on file  Social History Narrative  . Not on file   Additional Social History:                         Sleep: Good  Appetite:  Good  Current Medications: Current Facility-Administered Medications  Medication Dose Route Frequency Provider Last Rate Last Dose  . acetaminophen (TYLENOL) tablet 650 mg  650 mg Oral Q6H PRN Clapacs, John T, MD      . alum & mag hydroxide-simeth (MAALOX/MYLANTA) 200-200-20 MG/5ML suspension 30 mL  30 mL Oral Q4H PRN Clapacs, John T, MD      . benztropine (COGENTIN) tablet 1  mg  1 mg Oral Q6H PRN Beverly Sessions, MD       Or  . benztropine mesylate (COGENTIN) injection 1 mg  1 mg Intramuscular Q6H PRN Beverly Sessions, MD      . carbamazepine (TEGRETOL) tablet 200 mg  200 mg Oral BID Anjuli Gemmill R, MD   200 mg at 12/01/17 1610  . haloperidol (HALDOL) tablet 5 mg  5 mg Oral Q6H PRN Beverly Sessions, MD   5 mg at 11/22/17 1646   Or  . haloperidol lactate (HALDOL) injection 5 mg  5 mg Intramuscular Q6H PRN Beverly Sessions, MD      . LORazepam (ATIVAN) injection 1 mg  1 mg Intramuscular Q6H PRN He, Jun, MD   1 mg at 11/22/17 0208  . LORazepam (ATIVAN) tablet 1 mg  1 mg Oral Q6H PRN Beverly Sessions, MD   1 mg at 11/22/17 1646   Or  . LORazepam (ATIVAN) injection 1 mg  1 mg Intramuscular Q6H PRN Beverly Sessions, MD      . magnesium hydroxide (MILK OF MAGNESIA) suspension 30 mL  30 mL Oral Daily PRN Clapacs, John T, MD      . QUEtiapine (SEROQUEL) tablet 300 mg  300 mg Oral QHS Seerat Peaden, Ileene Hutchinson, MD   300 mg at 11/30/17 2300  . thiamine (VITAMIN B-1) tablet 100 mg  100 mg Oral Daily Beverly Sessions, MD   100 mg at 12/01/17 0829  . traZODone (DESYREL) tablet 100 mg  100 mg Oral QHS PRN He, Jun, MD   100 mg at 11/25/17 2015    Lab Results: No results found for this or any previous visit (from the past 48 hour(s)).  Blood Alcohol level:  Lab Results  Component Value Date   ETH <10 11/04/2017    Metabolic Disorder Labs: No results found for: HGBA1C, MPG No results found for: PROLACTIN No results found for: CHOL, TRIG, HDL, CHOLHDL, VLDL, LDLCALC  Physical Findings: AIMS: Facial and Oral Movements Muscles of Facial Expression: None, normal Lips and Perioral Area: None, normal Jaw: None, normal Tongue: None, normal,Extremity Movements Upper (arms, wrists, hands, fingers): None, normal Lower (legs, knees, ankles, toes): None, normal, Trunk Movements Neck, shoulders, hips: None, normal, Overall Severity Severity of abnormal movements (highest score from  questions above): None, normal Incapacitation due to abnormal movements: None, normal Patient's awareness of abnormal movements (rate only patient's report): No Awareness, Dental Status Current problems with teeth and/or dentures?: No Does patient usually wear dentures?: No  CIWA:  COWS:     Musculoskeletal: Strength & Muscle Tone: within normal limits Gait & Station: normal Patient leans: N/A  Psychiatric Specialty Exam: Physical Exam  Nursing note and vitals reviewed.   Review of Systems  All other systems reviewed and are negative.   Blood pressure (!) 121/91, pulse 74, temperature 97.8 F (36.6 C), temperature source Oral, resp. rate 18, height 5\' 8"  (1.727 m), weight 80.7 kg, SpO2 95 %.Body mass index is 27.06 kg/m.  General Appearance: Disheveled  Eye Contact:  Fair  Speech:  Clear and Coherent  Volume:  Normal  Mood:  Depressed  Affect:  Constricted, sad  Thought Process:  Coherent and Goal Directed  Orientation:  Full (Time, Place, and Person)  Thought Content:  Logical  Suicidal Thoughts:  No  Homicidal Thoughts:  No  Memory:  Immediate;   Poor  Judgement:  Impaired  Insight:  Lacking  Psychomotor Activity:  Normal  Concentration:  Concentration: Poor  Recall:  Poor  Fund of Knowledge:  Fair  Language:  Fair  Akathisia:  No      Assets:  Resilience  ADL's:  Intact  Cognition:  Impaired,  Moderate  Sleep:  Number of Hours: 6.3     Treatment Plan Summary: 47 yo female admitted due to agitation at long term treatment center. She does have some verbaloutburstat times(in regards to requests for vitals and taking medications)but per staff these are much less frequent.When she does have a verbal outburst, she is able to calm herself down after a very short period of time. The outbursts are mostly in response to being very frustrated with being in the hospital and lack of freedom. She interacts very appropriate with other peers.She has not had any  physicalaggressionor violence at all. I have been able to have great conversations with her. I have been trying to involve her in her care as much as possibleand she responds very well to this. She expresses her frustration with being locked up in a hospital and most recently in a memory care unit with people not close to her age. She has had some difficulty and frustration with change in independence. MOCA was done on 9/27showed some difficulty with delayed recall but overall she did well. She scored 21/30. I feel that a locked memory care unit was too restrictive level of care for her and would benefitmuch more by unlocked facility but with supervisionwhich she is very open to. I don't feel she can live fully independently at this time due to cognitive deficits.  I strongly feel that a locked memory care unit was much too restrictive level of care for her and decompensated because of this. This has not worked for her in the past and not likely to bebeneficial for herin the future. She is higher functioning and has been adequately able to care for herself. I wouldn't classifyher memory issues as "severe" and more moderate deficitin short term memory.I think she would benefitmore from an assisted living type environment or group home setting and this would allow her moreindependenceand allow her to thrive. She could benefit from some supervision of medications and orientation.She has been compliant with her medicationsoverall but does requires some encouragement at times.CSW will begin referrals to group homesif guardian allowswhich would be a much less restrictive level of care for her but provide some supervision and support.Outburstsare improving and much less frequent. I spoke with her guardian today who feels [facility] has a great program for her and would be willing to  consider her. I also feel this would be a great level of care for her to allow her some supervision.    Plan:  Mood disorder -Continue Seroquel 200 mg BID -Continue Tegretol 200 mg BID  Unspecified Cognitive impairment-alcohol induced -MOCA on 9/27 was 21/30 -PT evaluated and felt she was now independent with cares. -OT recommends supervised living due to poor short term memory issues and help with tracking day of the week and day to day management skills. This could be accomplished in an assisted living or groups home living situation  Dispo -Pt has legal guardian. She has contacted [facility] program who may be willing to consider her. Her guardian has been actively looking for placement as well as pts care coordinators.  Haskell Riling, MD 12/01/2017, 11:40 AM

## 2017-12-01 NOTE — Plan of Care (Signed)
D: Pt denies SI/HI/AV hallucinations. Pt is pleasant and cooperative. Patient has an episode today early this morning when she was upset and yelling. Patient redirected concerning her behavior and no other outburst noted. Patient has been in milieu interacting with peers. A: Pt was offered support and encouragement. Pt was given scheduled medications. Pt was encourage to attend groups. Q 15 minute checks were done for safety.  R:Pt attends groups and interacts well with peers and staff. Pt is taking medication. Pt has no complaints.Pt receptive to treatment and safety maintained on unit.    Problem: Activity: Goal: Will identify at least one activity in which they can participate Outcome: Progressing   Problem: Coping: Goal: Ability to identify and develop effective coping behavior will improve Outcome: Progressing Goal: Ability to interact with others will improve Outcome: Progressing Goal: Demonstration of participation in decision-making regarding own care will improve Outcome: Progressing Goal: Ability to use eye contact when communicating with others will improve Outcome: Progressing   Problem: Health Behavior/Discharge Planning: Goal: Identification of resources available to assist in meeting health care needs will improve Outcome: Progressing   Problem: Self-Concept: Goal: Will verbalize positive feelings about self Outcome: Progressing   Problem: Safety: Goal: Ability to remain free from injury will improve Outcome: Progressing

## 2017-12-01 NOTE — Progress Notes (Signed)
Patient ID: Joanne Johnston, female   DOB: 05/30/1970, 47 y.o.   MRN: [id number] CSW sent additional notes on Pt progress and FL-2 note to Guardian per her request.  CSW will follow up by email to make sure they were received.  Jake Shark, LCSW

## 2017-12-01 NOTE — Progress Notes (Signed)
Recreation Therapy Notes  Date: 12/01/2017  Time: 9:30 am   Location: Craft room   Behavioral response: N/A   Intervention Topic: Self-esteem  Discussion/Intervention: Patient did not attend group.   Clinical Observations/Feedback:  Patient did not attend group.   Ashleah Valtierra LRT/CTRS        Cherity Blickenstaff 12/01/2017 11:36 AM 

## 2017-12-02 NOTE — Progress Notes (Signed)
Received Joanne Johnston this AM in her room asleep, she woke up for lunch. She has been OOB in the milieu at intervals. She took her morning medications this evening.

## 2017-12-02 NOTE — Progress Notes (Signed)
DAR Note: Pt continue to be labile, confused and disorganized. Pt at assessment continue to endorse anxiety and irritability, "everyone just want you to do this and that, they keep getting you..."  Pt denied depression, pain, SI/HI or AVA. All Pt's questions and concerns addressed. Pt does not look to be in any acute distress. Support, encouragement, and safe environment provided. Pt was med compliant. 15-minute safety checks continue.

## 2017-12-02 NOTE — [facility] Group Notes (Signed)
12/02/2017 1PM  Type of Therapy/Topic:  Group Therapy:  Feelings about Diagnosis  Participation Level:  Minimal   Description of Group:   This group will allow patients to explore their thoughts and feelings about diagnoses they have received. Patients will be guided to explore their level of understanding and acceptance of these diagnoses. Facilitator will encourage patients to process their thoughts and feelings about the reactions of others to their diagnosis and will guide patients in identifying ways to discuss their diagnosis with significant others in their lives. This group will be process-oriented, with patients participating in exploration of their own experiences, giving and receiving support, and processing challenge from other group members.   Therapeutic Goals: 1. Patient will demonstrate understanding of diagnosis as evidenced by identifying two or more symptoms of the disorder 2. Patient will be able to express two feelings regarding the diagnosis 3. Patient will demonstrate their ability to communicate their needs through discussion and/or role play  Summary of Patient Progress: Patient practiced active listening when interacting with the facilitator and other group members.Patient engageged during the icebreaker but did not verbally participate throughout the group discussion. Patient is still in the process of obtaining treatment goals.        Therapeutic Modalities:   Cognitive Behavioral Therapy Brief Therapy Feelings Identification    Joanne Shears, LCSW 12/02/2017 3:47 PM

## 2017-12-02 NOTE — Progress Notes (Addendum)
[facility] MD Progress Note  12/02/2017 11:24 AM Joanne Johnston  MRN:  [id number] Subjective:  Pt had no outbursts last night or this morning so far. She has brighter affect today. We discussed that we are working very hard on placement and have looked into a facility in [place] that will review her information. She was happy to hear this and feels this sounds like a good place. This has made her feel more hopeful. She is smiling and looks less depressed than yesterday. She is organized in her thoughts. Very appropriate and calm in our interaction today. Denies SI, HI, Ah, VH. Took meds last night with no issue.   Principal Problem: Unspecified mood (affective) disorder (HCC) Diagnosis:   Patient Active Problem List   Diagnosis Date Noted  . Unspecified mood (affective) disorder (HCC) [F39] 11/18/2017    Priority: High  . 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 Agitation [R45.1] 11/04/2017   Total Time spent with patient: 20 minutes  Past Psychiatric History: See H&p  Past Medical History:  Past Medical History:  Diagnosis Date  . Alcohol dependence (HCC)   . Anxiety   . Dementia   . Depression   . Hypertension   . Tachycardia     Past Surgical History:  Procedure Laterality Date  . CESAREAN SECTION     x2   Family History: History reviewed. No pertinent family history. Family Psychiatric  History: See H&P Social History:  Social History   Substance and Sexual Activity  Alcohol Use Not Currently  . Frequency: Never   Comment: states hasn't had a drink in months     Social History   Substance and Sexual Activity  Drug Use Never    Social History   Socioeconomic History  . Marital status: Divorced    Spouse name: Not on file  . Number of children: Not on file  . Years of education: Not on file  . Highest education level: Not on file  Occupational History  . Not on file  Social Needs  . Financial resource strain: Not on  file  . Food insecurity:    Worry: Not on file    Inability: Not on file  . Transportation needs:    Medical: Not on file    Non-medical: Not on file  Tobacco Use  . Smoking status: Never Smoker  . Smokeless tobacco: Never Used  Substance and Sexual Activity  . Alcohol use: Not Currently    Frequency: Never    Comment: states hasn't had a drink in months  . Drug use: Never  . Sexual activity: Not Currently  Lifestyle  . Physical activity:    Days per week: Not on file    Minutes per session: Not on file  . Stress: Not on file  Relationships  . Social connections:    Talks on phone: Not on file    Gets together: Not on file    Attends religious service: Not on file    Active member of club or organization: Not on file    Attends meetings of clubs or organizations: Not on file    Relationship status: Not on file  Other Topics Concern  . Not on file  Social History Narrative  . Not on file   Additional Social History:                         Sleep: Good  Appetite:  Good  Current Medications: Current Facility-Administered Medications  Medication Dose Route Frequency Provider Last Rate Last Dose  . acetaminophen (TYLENOL) tablet 650 mg  650 mg Oral Q6H PRN Clapacs, John T, MD      . alum & mag hydroxide-simeth (MAALOX/MYLANTA) 200-200-20 MG/5ML suspension 30 mL  30 mL Oral Q4H PRN Clapacs, John T, MD      . benztropine (COGENTIN) tablet 1 mg  1 mg Oral Q6H PRN Beverly Sessions, MD       Or  . benztropine mesylate (COGENTIN) injection 1 mg  1 mg Intramuscular Q6H PRN Beverly Sessions, MD      . carbamazepine (TEGRETOL) tablet 200 mg  200 mg Oral BID Marcheta Horsey R, MD   200 mg at 12/01/17 2020  . haloperidol (HALDOL) tablet 5 mg  5 mg Oral Q6H PRN Beverly Sessions, MD   5 mg at 11/22/17 1646   Or  . haloperidol lactate (HALDOL) injection 5 mg  5 mg Intramuscular Q6H PRN Beverly Sessions, MD      . LORazepam (ATIVAN) injection 1 mg  1 mg Intramuscular Q6H  PRN He, Jun, MD   1 mg at 11/22/17 0208  . LORazepam (ATIVAN) tablet 1 mg  1 mg Oral Q6H PRN Beverly Sessions, MD   1 mg at 11/22/17 1646   Or  . LORazepam (ATIVAN) injection 1 mg  1 mg Intramuscular Q6H PRN Beverly Sessions, MD      . magnesium hydroxide (MILK OF MAGNESIA) suspension 30 mL  30 mL Oral Daily PRN Clapacs, John T, MD      . QUEtiapine (SEROQUEL) tablet 300 mg  300 mg Oral QHS Domique Reardon, Ileene Hutchinson, MD   300 mg at 12/01/17 2142  . thiamine (VITAMIN B-1) tablet 100 mg  100 mg Oral Daily Beverly Sessions, MD   100 mg at 12/01/17 0829  . traZODone (DESYREL) tablet 100 mg  100 mg Oral QHS PRN He, Jun, MD   100 mg at 12/01/17 2142    Lab Results: No results found for this or any previous visit (from the past 48 hour(s)).  Blood Alcohol level:  Lab Results  Component Value Date   ETH <10 11/04/2017    Metabolic Disorder Labs: No results found for: HGBA1C, MPG No results found for: PROLACTIN No results found for: CHOL, TRIG, HDL, CHOLHDL, VLDL, LDLCALC  Physical Findings: AIMS: Facial and Oral Movements Muscles of Facial Expression: None, normal Lips and Perioral Area: None, normal Jaw: None, normal Tongue: None, normal,Extremity Movements Upper (arms, wrists, hands, fingers): None, normal Lower (legs, knees, ankles, toes): None, normal, Trunk Movements Neck, shoulders, hips: None, normal, Overall Severity Severity of abnormal movements (highest score from questions above): None, normal Incapacitation due to abnormal movements: None, normal Patient's awareness of abnormal movements (rate only patient's report): No Awareness, Dental Status Current problems with teeth and/or dentures?: No Does patient usually wear dentures?: No  CIWA:    COWS:     Musculoskeletal: Strength & Muscle Tone: within normal limits Gait & Station: normal Patient leans: N/A  Psychiatric Specialty Exam: Physical Exam  Nursing note and vitals reviewed.   Review of Systems  All other systems  reviewed and are negative.   Blood pressure 128/89, pulse 75, temperature 98.1 F (36.7 C), temperature source Oral, resp. rate 20, height 5\' 8"  (1.727 m), weight 80.7 kg, SpO2 100 %.Body mass index is 27.06 kg/m.  General Appearance: Disheveled  Eye Contact:  Fair  Speech:  Clear and Coherent  Volume:  Normal  Mood:  Depressed  Affect:  Constricted  Thought Process:  Coherent and Goal Directed  Orientation:  Full (Time, Place, and Person)  Thought Content:  Logical  Suicidal Thoughts:  No  Homicidal Thoughts:  No  Memory:  Immediate;   Poor  Judgement:  Impaired  Insight:  Lacking  Psychomotor Activity:  Normal  Concentration:  Concentration: Poor  Recall:  Poor  Fund of Knowledge:  Fair  Language:  Fair  Akathisia:  No      Assets:  Resilience  ADL's:  Intact  Cognition:  Impaired,  Moderate  Sleep:  Number of Hours: 6.75     Treatment Plan Summary: 47 yo female admitted due to outbursts at her memory care center. Pt has been overall much calmer. She does have some irritability at times due to long term hospitalization but able to calm herself down well. I continue to recommend some supervision on discharge but feel locked memory care unit is much too restrictive for her. Guardian has been in contact with [facility] who will review her information. Pt is taking medications. She is calm today.   Plan:  Mood disorder -Continue Seroquel 300 mg qhs -Continue Tegretol 200 mg BID  Unspecified Cognitive impairment-alcohol induced -MOCA on 9/27 was 21/30 -PT evaluated and felt she was now independent with cares. -OT recommends supervised living due to poor short term memory issues and help with tracking day of the week and day to day management skills. This could be accomplished in an assisted living or groups home living situation  Dispo -Pt has legal guardian. She has contacted [facility] who may be willing to consider her. Her guardian has been  actively looking for placement as well as pts care coordinators  Haskell Riling, MD 12/02/2017, 11:24 AM

## 2017-12-03 NOTE — Plan of Care (Signed)
Has been in the dayroom with peers, watching TV. Cooperative.

## 2017-12-03 NOTE — Progress Notes (Signed)
Ovella has been visible in the milieu. Currently in the dayroom, calm and cooperative. Alert and oriented. No sign of distress. Denying thoughts of self harm. Received her due medication and has no concern so far. Was encouraged to talk to staff as needed. Support provided and safety maintained.

## 2017-12-03 NOTE — Progress Notes (Signed)
Recreation Therapy Notes   Date: 12/03/2017  Time: 9:30 am   Location: Craft Room  Behavioral response: Appropriate   Intervention Topic: Relaxation  Discussion/Intervention:  Group content today was focused on relaxation. The group defined relaxation and identified healthy ways to relax. Individuals expressed how much time they spend relaxing. Patients expressed how much their life would be if they did not make time for themselves to relax. The group stated ways they could improve their relaxation techniques in the future.  Individuals participated in the intervention "Time to Relax" where they had a chance to experience different relaxation techniques.  Clinical Observations/Feedback:  Patient came to group late due to unknown reasons. Individual was social with peers and staff while participating in the intervention.  Jessyca Sloan LRT/CTRS         Jalayla Chrismer 12/03/2017 12:07 PM

## 2017-12-03 NOTE — Progress Notes (Signed)
Patient's thought process more organized. Alert and oriented. Pleasant and cooperative. Patient programmed well in unit activities and had no anger or agitations. Received snack and medications and had no concern. Was encouraged to talk to staff as needed. Currently in bed sleeping. No sign of discomfort noted. Safety precautions maintained.

## 2017-12-03 NOTE — Progress Notes (Signed)
Received Joanne Johnston this AM after breakfast, she was compliant with her medications before noon. She has been OOB in the milieu and making phone calls. No behavioral or mood problems today.No change in her mood the PM.

## 2017-12-03 NOTE — Plan of Care (Signed)
Pleasant and cooperative. No episodes of agitations or outburst

## 2017-12-03 NOTE — Progress Notes (Addendum)
[facility] MD Progress Note  12/03/2017 3:11 PM Joanne Johnston  MRN:  [id number] Subjective:  Pt is very calm and pleasant today. She is out of her rom most of the day socializing and smiling with peers. She is sitting calmly in the dayroom and has been attending groups. She was smiling today. We reviewed the plan and our referral process as she had some difficulty remembering this conversation from yesterday. She was happy to hear we are trying to get her into a facility. She is sleeping well. She has no complaints today and thanked this provider.   Principal Problem: Unspecified mood (affective) disorder (HCC) Diagnosis:   Patient Active Problem List   Diagnosis Date Noted  . Unspecified mood (affective) disorder (HCC) [F39] 11/18/2017    Priority: High  . 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 Agitation [R45.1] 11/04/2017   Total Time spent with patient: 15 minutes  Past Psychiatric History: See H&p  Past Medical History:  Past Medical History:  Diagnosis Date  . Alcohol dependence (HCC)   . Anxiety   . Dementia   . Depression   . Hypertension   . Tachycardia     Past Surgical History:  Procedure Laterality Date  . CESAREAN SECTION     x2   Family History: History reviewed. No pertinent family history. Family Psychiatric  History: See H&P Social History:  Social History   Substance and Sexual Activity  Alcohol Use Not Currently  . Frequency: Never   Comment: states hasn't had a drink in months     Social History   Substance and Sexual Activity  Drug Use Never    Social History   Socioeconomic History  . Marital status: Divorced    Spouse name: Not on file  . Number of children: Not on file  . Years of education: Not on file  . Highest education level: Not on file  Occupational History  . Not on file  Social Needs  . Financial resource strain: Not on file  . Food insecurity:    Worry: Not on file    Inability: Not  on file  . Transportation needs:    Medical: Not on file    Non-medical: Not on file  Tobacco Use  . Smoking status: Never Smoker  . Smokeless tobacco: Never Used  Substance and Sexual Activity  . Alcohol use: Not Currently    Frequency: Never    Comment: states hasn't had a drink in months  . Drug use: Never  . Sexual activity: Not Currently  Lifestyle  . Physical activity:    Days per week: Not on file    Minutes per session: Not on file  . Stress: Not on file  Relationships  . Social connections:    Talks on phone: Not on file    Gets together: Not on file    Attends religious service: Not on file    Active member of club or organization: Not on file    Attends meetings of clubs or organizations: Not on file    Relationship status: Not on file  Other Topics Concern  . Not on file  Social History Narrative  . Not on file   Additional Social History:                         Sleep: Good  Appetite:  Good  Current Medications: Current Facility-Administered Medications  Medication Dose Route Frequency Provider Last  Rate Last Dose  . acetaminophen (TYLENOL) tablet 650 mg  650 mg Oral Q6H PRN Clapacs, John T, MD      . alum & mag hydroxide-simeth (MAALOX/MYLANTA) 200-200-20 MG/5ML suspension 30 mL  30 mL Oral Q4H PRN Clapacs, John T, MD      . benztropine (COGENTIN) tablet 1 mg  1 mg Oral Q6H PRN Beverly Sessions, MD       Or  . benztropine mesylate (COGENTIN) injection 1 mg  1 mg Intramuscular Q6H PRN Beverly Sessions, MD      . carbamazepine (TEGRETOL) tablet 200 mg  200 mg Oral BID Derl Abalos R, MD   200 mg at 12/03/17 1028  . haloperidol (HALDOL) tablet 5 mg  5 mg Oral Q6H PRN Beverly Sessions, MD   5 mg at 11/22/17 1646   Or  . haloperidol lactate (HALDOL) injection 5 mg  5 mg Intramuscular Q6H PRN Beverly Sessions, MD      . LORazepam (ATIVAN) injection 1 mg  1 mg Intramuscular Q6H PRN He, Jun, MD   1 mg at 11/22/17 0208  . LORazepam (ATIVAN) tablet  1 mg  1 mg Oral Q6H PRN Beverly Sessions, MD   1 mg at 11/22/17 1646   Or  . LORazepam (ATIVAN) injection 1 mg  1 mg Intramuscular Q6H PRN Beverly Sessions, MD      . magnesium hydroxide (MILK OF MAGNESIA) suspension 30 mL  30 mL Oral Daily PRN Clapacs, John T, MD      . QUEtiapine (SEROQUEL) tablet 300 mg  300 mg Oral QHS Dawnna Gritz, Ileene Hutchinson, MD   300 mg at 12/02/17 2129  . thiamine (VITAMIN B-1) tablet 100 mg  100 mg Oral Daily Beverly Sessions, MD   100 mg at 12/03/17 1028  . traZODone (DESYREL) tablet 100 mg  100 mg Oral QHS PRN He, Jun, MD   100 mg at 12/01/17 2142    Lab Results: No results found for this or any previous visit (from the past 48 hour(s)).  Blood Alcohol level:  Lab Results  Component Value Date   ETH <10 11/04/2017    Metabolic Disorder Labs: No results found for: HGBA1C, MPG No results found for: PROLACTIN No results found for: CHOL, TRIG, HDL, CHOLHDL, VLDL, LDLCALC  Physical Findings: AIMS: Facial and Oral Movements Muscles of Facial Expression: None, normal Lips and Perioral Area: None, normal Jaw: None, normal Tongue: None, normal,Extremity Movements Upper (arms, wrists, hands, fingers): None, normal Lower (legs, knees, ankles, toes): None, normal, Trunk Movements Neck, shoulders, hips: None, normal, Overall Severity Severity of abnormal movements (highest score from questions above): None, normal Incapacitation due to abnormal movements: None, normal Patient's awareness of abnormal movements (rate only patient's report): No Awareness, Dental Status Current problems with teeth and/or dentures?: No Does patient usually wear dentures?: No  CIWA:    COWS:     Musculoskeletal: Strength & Muscle Tone: within normal limits Gait & Station: Steady Patient leans: N/A  Psychiatric Specialty Exam: Physical Exam  Nursing note and vitals reviewed.   Review of Systems  All other systems reviewed and are negative.   Blood pressure 128/89, pulse 75,  temperature 98.1 F (36.7 C), temperature source Oral, resp. rate 20, height 5\' 8"  (1.727 m), weight 80.7 kg, SpO2 100 %.Body mass index is 27.06 kg/m.  General Appearance: Casual  Eye Contact:  Good  Speech:  Clear and Coherent  Volume:  Normal  Mood:  Euthymic  Affect:  Appropriate  Thought Process:  Coherent  and Goal Directed  Orientation:  Full (Time, Place, and Person)  Thought Content:  Logical  Suicidal Thoughts:  No  Homicidal Thoughts:  No  Memory:  Immediate;   Poor  Judgement:  Impaired  Insight:  Lacking  Psychomotor Activity:  Normal  Concentration:  Concentration: Poor  Recall:  Poor  Fund of Knowledge:  Fair  Language:  Fair  Akathisia:  No      Assets:  Resilience  ADL's:  Intact  Cognition:  Impaired,  Moderate  Sleep:  Number of Hours: 7.5     Treatment Plan Summary:46 yo female admitted due to outbursts at her memory care center. She is very calm and pleasant today. No outbursts overnight. She is appropriate and social with peers today. Continue to recommend some supervision at discharge but feel locked memory care unit was much too restrictive for her. Guardian working on referral to [facility] [employer] [employer]. She is taking medications.   Plan:  Mood disorder -Continue Seroquel 300 mg q hs -Continue Tegretol 200 mg BID  Unspecified Cognitive impairment-alcohol induced -MOCA on 9/27 was 21/30 -PT evaluated and felt she was now independent with cares. -OT recommends supervised living due to poor short term memory issues and help with tracking day of the week and day to day management skills. This could be accomplished in an assisted living or groups home living situation  Dispo -Pt has legal guardian. She has contacted [facility] program who may be willing to consider her. Her guardian has been actively looking for placement as well as pts care coordinators  Haskell Riling, MD 12/03/2017, 3:11 PM

## 2017-12-04 DIAGNOSIS — F04 Amnestic disorder due to known physiological condition: Secondary | ICD-10-CM

## 2017-12-04 NOTE — Progress Notes (Signed)
Recreation Therapy Notes  Date: 12/04/2017  Time: 9:30 am   Location: Craft room   Behavioral response: N/A   Intervention Topic: Communication  Discussion/Intervention: Patient did not attend group.   Clinical Observations/Feedback:  Patient did not attend group.        Joanne Johnston 12/04/2017 10:44 AM

## 2017-12-04 NOTE — Progress Notes (Signed)
[facility] MD Progress Note  12/04/2017 3:12 PM Joanne Johnston  MRN:  [id number] Subjective:  Pt is calm and cooperative today. She continues to have issues with short term memory and does not recall our conversation yesterday at all in which we talked about placement. She is dischevled today. She did come out of her room later and was social and interacting with peers. No behavioral outbursts and did take medications with no issues.   Principal Problem: Unspecified mood (affective) disorder (HCC) Diagnosis:   Patient Active Problem List   Diagnosis Date Noted  . Unspecified mood (affective) disorder (HCC) [F39] 11/18/2017    Priority: High  . 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 Agitation [R45.1] 11/04/2017   Total Time spent with patient: 15 minutes  Past Psychiatric History: See H&P  Past Medical History:  Past Medical History:  Diagnosis Date  . Alcohol dependence (HCC)   . Anxiety   . Dementia   . Depression   . Hypertension   . Tachycardia     Past Surgical History:  Procedure Laterality Date  . CESAREAN SECTION     x2   Family History: History reviewed. No pertinent family history. Family Psychiatric  History: See H&P Social History:  Social History   Substance and Sexual Activity  Alcohol Use Not Currently  . Frequency: Never   Comment: states hasn't had a drink in months     Social History   Substance and Sexual Activity  Drug Use Never    Social History   Socioeconomic History  . Marital status: Divorced    Spouse name: Not on file  . Number of children: Not on file  . Years of education: Not on file  . Highest education level: Not on file  Occupational History  . Not on file  Social Needs  . Financial resource strain: Not on file  . Food insecurity:    Worry: Not on file    Inability: Not on file  . Transportation needs:    Medical: Not on file   Non-medical: Not on file  Tobacco Use  . Smoking status: Never Smoker  . Smokeless tobacco: Never Used  Substance and Sexual Activity  . Alcohol use: Not Currently    Frequency: Never    Comment: states hasn't had a drink in months  . Drug use: Never  . Sexual activity: Not Currently  Lifestyle  . Physical activity:    Days per week: Not on file    Minutes per session: Not on file  . Stress: Not on file  Relationships  . Social connections:    Talks on phone: Not on file    Gets together: Not on file    Attends religious service: Not on file    Active member of club or organization: Not on file    Attends meetings of clubs or organizations: Not on file    Relationship status: Not on file  Other Topics Concern  . Not on file  Social History Narrative  . Not on file   Additional Social History:                         Sleep: Good  Appetite:  Good  Current Medications: Current Facility-Administered Medications  Medication Dose Route Frequency Provider Last Rate Last Dose  . acetaminophen (TYLENOL) tablet 650 mg  650 mg Oral Q6H PRN Clapacs, John T,  MD      . alum & mag hydroxide-simeth (MAALOX/MYLANTA) 200-200-20 MG/5ML suspension 30 mL  30 mL Oral Q4H PRN Clapacs, John T, MD      . benztropine (COGENTIN) tablet 1 mg  1 mg Oral Q6H PRN Beverly Sessions, MD       Or  . benztropine mesylate (COGENTIN) injection 1 mg  1 mg Intramuscular Q6H PRN Beverly Sessions, MD      . carbamazepine (TEGRETOL) tablet 200 mg  200 mg Oral BID Jaylon Grode R, MD   200 mg at 12/03/17 2005  . haloperidol (HALDOL) tablet 5 mg  5 mg Oral Q6H PRN Beverly Sessions, MD   5 mg at 11/22/17 1646   Or  . haloperidol lactate (HALDOL) injection 5 mg  5 mg Intramuscular Q6H PRN Beverly Sessions, MD      . LORazepam (ATIVAN) injection 1 mg  1 mg Intramuscular Q6H PRN He, Jun, MD   1 mg at 11/22/17 0208  . LORazepam (ATIVAN) tablet 1 mg  1 mg Oral Q6H PRN Beverly Sessions, MD   1 mg at  11/22/17 1646   Or  . LORazepam (ATIVAN) injection 1 mg  1 mg Intramuscular Q6H PRN Beverly Sessions, MD      . magnesium hydroxide (MILK OF MAGNESIA) suspension 30 mL  30 mL Oral Daily PRN Clapacs, John T, MD      . QUEtiapine (SEROQUEL) tablet 300 mg  300 mg Oral QHS Pearse Shiffler, Ileene Hutchinson, MD   300 mg at 12/03/17 2154  . thiamine (VITAMIN B-1) tablet 100 mg  100 mg Oral Daily Beverly Sessions, MD   100 mg at 12/03/17 1028  . traZODone (DESYREL) tablet 100 mg  100 mg Oral QHS PRN He, Jun, MD   100 mg at 12/01/17 2142    Lab Results: No results found for this or any previous visit (from the past 48 hour(s)).  Blood Alcohol level:  Lab Results  Component Value Date   ETH <10 11/04/2017    Metabolic Disorder Labs: No results found for: HGBA1C, MPG No results found for: PROLACTIN No results found for: CHOL, TRIG, HDL, CHOLHDL, VLDL, LDLCALC  Physical Findings: AIMS: Facial and Oral Movements Muscles of Facial Expression: None, normal Lips and Perioral Area: None, normal Jaw: None, normal Tongue: None, normal,Extremity Movements Upper (arms, wrists, hands, fingers): None, normal Lower (legs, knees, ankles, toes): None, normal, Trunk Movements Neck, shoulders, hips: None, normal, Overall Severity Severity of abnormal movements (highest score from questions above): None, normal Incapacitation due to abnormal movements: None, normal Patient's awareness of abnormal movements (rate only patient's report): No Awareness, Dental Status Current problems with teeth and/or dentures?: No Does patient usually wear dentures?: No  CIWA:    COWS:     Musculoskeletal: Strength & Muscle Tone: within normal limits Gait & Station: normal Patient leans: N/A  Psychiatric Specialty Exam: Physical Exam  Nursing note and vitals reviewed.   Review of Systems  All other systems reviewed and are negative.   Blood pressure 128/89, pulse 75, temperature 98.1 F (36.7 C), temperature source Oral, resp.  rate 20, height 5\' 8"  (1.727 m), weight 80.7 kg, SpO2 100 %.Body mass index is 27.06 kg/m.  General Appearance: Disheveled  Eye Contact:  Good  Speech:  Clear and Coherent  Volume:  Normal  Mood:  Euthymic  Affect:  Appropriate  Thought Process:  Coherent and Goal Directed  Orientation:  Full (Time, Place, and Person)  Thought Content:  Logical  Suicidal Thoughts:  No  Homicidal Thoughts:  No  Memory:  Immediate;   Poor  Judgement:  Impaired  Insight:  Lacking  Psychomotor Activity:  Normal  Concentration:  Concentration: Poor  Recall:  Poor  Fund of Knowledge:  Fair  Language:  Fair  Akathisia:  No      Assets:  Resilience  ADL's:  Intact  Cognition:  Impaired,  Moderate  Sleep:  Number of Hours: 6.45     Treatment Plan Summary: 47 yo female admitted due to behavioral issues at memory care unit. She has been taking her medications and no behavioral outbursts in several days. She continues to be confused at times and issues with short term memory. She is interacting well with other peers.   Plan:  Mood disorder -Continue Seroquel 300 mg qhs -Continue Tegretol 200 mg BID  Cognitive impairment/Korsakoff's syndrome --MOCA on 9/27 was 21/30 -PT evaluated and felt she was now independent with cares. -OT recommends supervised living due to poor short term memory issues and help with tracking day of the week and day to day management skills. This could be accomplished in an assisted living or groups home living situation  Dispo -Pt has legal guardian. She has contacted [facility] who may be willing to consider her. Her guardian has been actively looking for placement as well as [facility]scare coordinators  Haskell Riling, MD 12/04/2017, 3:12 PM

## 2017-12-04 NOTE — Plan of Care (Signed)
  Problem: Activity: Goal: Will identify at least one activity in which they can participate Outcome: Progressing   Problem: Coping: Goal: Ability to identify and develop effective coping behavior will improve Outcome: Progressing Goal: Ability to interact with others will improve Outcome: Progressing Goal: Demonstration of participation in decision-making regarding own care will improve Outcome: Progressing Goal: Ability to use eye contact when communicating with others will improve Outcome: Progressing   Problem: Health Behavior/Discharge Planning: Goal: Identification of resources available to assist in meeting health care needs will improve Outcome: Progressing   Problem: Self-Concept: Goal: Will verbalize positive feelings about self Outcome: Progressing   Problem: Safety: Goal: Ability to remain free from injury will improve Outcome: Progressing

## 2017-12-04 NOTE — Progress Notes (Addendum)
Nursing note 7p-7a  Pt observed interacting with peers on unit this shift. Displayed a flat affect and irritable mood upon interaction with this Clinical research associate.Pt did smile and say thank you after med pass. Pt denies pain ,denies SI/HI, and also denies any audio or visual hallucinations at this time.  Pt screamed at staff during rounding this shift. Pt is now resting in bed with eyes closed, with no signs or symptoms of pain or distress noted. Pt continues to remain safe on the unit and is observed by rounding every 15 min. RN will continue to monitor.

## 2017-12-04 NOTE — [facility] Group Notes (Signed)
LCSW Group Therapy Note  12/04/2017  1:00pm  Type of Therapy/Topic:  Group Therapy:  Balance in Life  Participation Level:  Did Attend  Description of Group:    This group will address the concept of balance and how it feels and looks when one is unbalanced. Patients will be encouraged to process areas in their lives that are out of balance and identify reasons for remaining unbalanced. Facilitators will guide patients in utilizing problem-solving interventions to address and correct the stressor making their life unbalanced. Understanding and applying boundaries will be explored and addressed for obtaining and maintaining a balanced life. Patients will be encouraged to explore ways to assertively make their unbalanced needs known to significant others in their lives, using other group members and facilitator for support and feedback.  Therapeutic Goals: 1. Patient will identify two or more emotions or situations they have that consume much of in their lives. 2. Patient will identify signs/triggers that life has become out of balance:  3. Patient will identify two ways to set boundaries in order to achieve balance in their lives:  4. Patient will demonstrate ability to communicate their needs through discussion and/or role plays  Summary of Patient Progress:  This patient as a group collective was able to identify several factors that contribute to unbalanced life such as :No sleep, stopping medications, not exercising ,isolating from friends and family, drugs and alcohol abuse and use. In the next portion of group patient was encouraged to talk about things that contribute to balance  their life such as rest, harmony, seeing family, exercise,setting small goals, creating lists,keeping medical appointments, taking medications.  Therapeutic Modalities:   Cognitive Behavioral Therapy Solution-Focused Therapy Assertiveness Training  Trystin Terhune M, LCSW 12/04/2017 2:07 PM     LCSW Group Therapy Note  12/04/2017  1:00pm  Type of Therapy/Topic:  Group Therapy:  Balance in Life  Participation Level:  Did Attend  Description of Group:    This group will address the concept of balance and how it feels and looks when one is unbalanced. Patients will be encouraged to process areas in their lives that are out of balance and identify reasons for remaining unbalanced. Facilitators will guide patients in utilizing problem-solving interventions to address and correct the stressor making their life unbalanced. Understanding and applying boundaries will be explored and addressed for obtaining and maintaining a balanced life. Patients will be encouraged to explore ways to assertively make their unbalanced needs known to significant others in their lives, using other group members and facilitator for support and feedback.  Therapeutic Goals: 1. Patient will identify two or more emotions or situations they have that consume much of in their lives. 2. Patient will identify signs/triggers that life has become out of balance:  3. Patient will identify two ways to set boundaries in order to achieve balance in their lives:  4. Patient will demonstrate ability to communicate their needs through discussion and/or role plays  Summary of Patient Progress:  This patient as a group collective was able to identify several factors that contribute to unbalanced life such as :No sleep, stopping medications, not exercising ,isolating from friends and family, drugs and alcohol abuse and use. In the next portion of group patient was encouraged to talk about things that contribute to balance  their life such as rest, harmony, seeing family, exercise,setting small goals, creating lists,keeping medical appointments, taking medications.  Therapeutic Modalities:   Cognitive Behavioral Therapy Solution-Focused Therapy Assertiveness Training  Cheron Schaumann, [place] 12/04/2017 2:07 PM

## 2017-12-04 NOTE — Plan of Care (Signed)
Patient is alert and oriented, denies SI, HI and AVH. Patient refused morning medications, but states she will notify nurse when she is ready to take medication. Patient rates pain 0/10. Patient pleasant, paces unit, speaks to peers. There are no self injurious behaviors noted. Patients coping behavior is improving, the ability to interact with others is also improving,along with the ability to use eye contact when communicating is improving. Nurse will continue to monitor and 15 minute checks will continue from MHT's. Problem: Health Behavior/Discharge Planning: Goal: Identification of resources available to assist in meeting health care needs will improve Outcome: Progressing   Problem: Activity: Goal: Will identify at least one activity in which they can participate Outcome: Progressing   Problem: Safety: Goal: Ability to remain free from injury will improve Outcome: Progressing

## 2017-12-05 NOTE — Tx Team (Signed)
Interdisciplinary Treatment and Diagnostic Plan Update  12/05/2017 Time of Session: 11am Assunta Pupo MRN: [id number]  Principal Diagnosis: Unspecified mood (affective) disorder (HCC)  Secondary Diagnoses: Principal Problem:   Unspecified mood (affective) disorder (HCC) Active Problems:   Alcohol-induced persisting dementia (HCC)   Korsakoff syndrome (HCC)   Current Medications:  Current Facility-Administered Medications  Medication Dose Route Frequency Provider Last Rate Last Dose  . acetaminophen (TYLENOL) tablet 650 mg  650 mg Oral Q6H PRN Clapacs, John T, MD      . alum & mag hydroxide-simeth (MAALOX/MYLANTA) 200-200-20 MG/5ML suspension 30 mL  30 mL Oral Q4H PRN Clapacs, John T, MD      . benztropine (COGENTIN) tablet 1 mg  1 mg Oral Q6H PRN Beverly Sessions, MD       Or  . benztropine mesylate (COGENTIN) injection 1 mg  1 mg Intramuscular Q6H PRN Beverly Sessions, MD      . carbamazepine (TEGRETOL) tablet 200 mg  200 mg Oral BID McNew, Holly R, MD   200 mg at 12/04/17 1134  . haloperidol (HALDOL) tablet 5 mg  5 mg Oral Q6H PRN Beverly Sessions, MD   5 mg at 11/22/17 1646   Or  . haloperidol lactate (HALDOL) injection 5 mg  5 mg Intramuscular Q6H PRN Beverly Sessions, MD      . LORazepam (ATIVAN) injection 1 mg  1 mg Intramuscular Q6H PRN He, Jun, MD   1 mg at 11/22/17 0208  . LORazepam (ATIVAN) tablet 1 mg  1 mg Oral Q6H PRN Beverly Sessions, MD   1 mg at 11/22/17 1646   Or  . LORazepam (ATIVAN) injection 1 mg  1 mg Intramuscular Q6H PRN Beverly Sessions, MD      . magnesium hydroxide (MILK OF MAGNESIA) suspension 30 mL  30 mL Oral Daily PRN Clapacs, John T, MD      . QUEtiapine (SEROQUEL) tablet 300 mg  300 mg Oral QHS McNew, Ileene Hutchinson, MD   300 mg at 12/04/17 2017  . thiamine (VITAMIN B-1) tablet 100 mg  100 mg Oral Daily Beverly Sessions, MD   100 mg at 12/04/17 1134  . traZODone (DESYREL) tablet 100 mg  100 mg Oral QHS PRN He, Jun, MD   100 mg at 12/01/17 2142    PTA Medications: Medications Prior to Admission  Medication Sig Dispense Refill Last Dose  . [EXPIRED] Multiple Vitamin (MULTI-VITAMINS) TABS Take 1 tablet by mouth daily.    unknown at unknown  . [EXPIRED] QUEtiapine (SEROQUEL) 25 MG tablet Take 25 mg by mouth daily.    unknown at unknown  . [EXPIRED] QUEtiapine (SEROQUEL) 50 MG tablet Take 50 mg by mouth at bedtime.    unknown at unknown  . topiramate (TOPAMAX) 50 MG tablet Take 50 mg by mouth daily.    unknwon at unknown    Patient Stressors: Other: People, buildings, rooms  Patient Strengths: Capable of independent living Physical Health Supportive family/friends  Treatment Modalities: Medication Management, Group therapy, Case management,  1 to 1 session with clinician, Psychoeducation, Recreational therapy.   Physician Treatment Plan for Primary Diagnosis: Unspecified mood (affective) disorder (HCC) Long Term Goal(s): Improvement in symptoms so as ready for discharge Improvement in symptoms so as ready for discharge   Short Term Goals: Ability to identify changes in lifestyle to reduce recurrence of condition will improve Ability to identify changes in lifestyle to reduce recurrence of condition will improve  Medication Management: Evaluate patient's response, side effects, and tolerance of medication regimen.  Therapeutic Interventions: 1 to 1 sessions, Unit Group sessions and Medication administration.  Evaluation of Outcomes: Progressing  Physician Treatment Plan for Secondary Diagnosis: Principal Problem:   Unspecified mood (affective) disorder (HCC) Active Problems:   Alcohol-induced persisting dementia (HCC)   Korsakoff syndrome (HCC)  Long Term Goal(s): Improvement in symptoms so as ready for discharge Improvement in symptoms so as ready for discharge   Short Term Goals: Ability to identify changes in lifestyle to reduce recurrence of condition will improve Ability to identify changes in lifestyle to reduce  recurrence of condition will improve     Medication Management: Evaluate patient's response, side effects, and tolerance of medication regimen.  Therapeutic Interventions: 1 to 1 sessions, Unit Group sessions and Medication administration.  Evaluation of Outcomes: Progressing   RN Treatment Plan for Primary Diagnosis: Unspecified mood (affective) disorder (HCC) Long Term Goal(s): Knowledge of disease and therapeutic regimen to maintain health will improve  Short Term Goals: Ability to identify and develop effective coping behaviors will improve and Compliance with prescribed medications will improve  Medication Management: RN will administer medications as ordered by provider, will assess and evaluate patient's response and provide education to patient for prescribed medication. RN will report any adverse and/or side effects to prescribing provider.  Therapeutic Interventions: 1 on 1 counseling sessions, Psychoeducation, Medication administration, Evaluate responses to treatment, Monitor vital signs and CBGs as ordered, Perform/monitor CIWA, COWS, AIMS and Fall Risk screenings as ordered, Perform wound care treatments as ordered.  Evaluation of Outcomes: Progressing   LCSW Treatment Plan for Primary Diagnosis: Unspecified mood (affective) disorder (HCC) Long Term Goal(s): Safe transition to appropriate next level of care at discharge, Engage patient in therapeutic group addressing interpersonal concerns.  Short Term Goals: Engage patient in aftercare planning with referrals and resources, Identify triggers associated with mental health/substance abuse issues and Increase skills for wellness and recovery  Therapeutic Interventions: Assess for all discharge needs, 1 to 1 time with Social worker, Explore available resources and support systems, Assess for adequacy in community support network, Educate family and significant other(s) on suicide prevention, Complete Psychosocial Assessment,  Interpersonal group therapy.  Evaluation of Outcomes: Progressing   Progress in Treatment: Attending groups: Yes. Participating in groups: Yes. and As evidenced by:  Pt has only been participating in the recreational groups. Taking medication as prescribed: Yes. Toleration medication: Yes. Family/Significant other contact made: Yes, individual(s) contacted:  CSW has made contact with pt's guardian, Esmeralda Arthur with [employer]. Patient understands diagnosis: No. Discussing patient identified problems/goals with staff: Yes. Medical problems stabilized or resolved: Yes. Denies suicidal/homicidal ideation: Yes. Issues/concerns per patient self-inventory: No. Other:    New problem(s) identified: No, Describe:     New Short Term/Long Term Goal(s):  Patient Goals:  To be discharged  Discharge Plan or Barriers: TBD.  CSW along with her guardian are continuing to explore appropriate residence options.  Pt may be more appropriate to go to an ALF. Guardian states that [facility] is considering her and CSW sent her additional information that they requested 10/7.  Reason for Continuation of Hospitalization: Anxiety Medication stabilization Other; describe Need to identify and secure appropriate residential placement for discharge.  Estimated Length of Stay: 7 days  Recreational Therapy: Patient Stressors: N/A Patient Goal: Patient will engage in interactions with peers and staff in pro-social manner at least 2x within 5 recreation therapy group sessions  Attendees: Patient: 12/05/2017 11:40 AM  Physician: Corinna Gab, MD 12/05/2017 11:40 AM  Nursing: Milas Hock, RN  12/05/2017 11:40 AM  RN Care Manager: 12/05/2017 11:40 AM  Social Worker: Joneen Roach, LCSWA 12/05/2017 11:40 AM  Recreational Therapist: Danella Deis. [facility], LRT 12/05/2017 11:40 AM  Other:  12/05/2017 11:40 AM  Other:  12/05/2017 11:40 AM  Other: 12/05/2017 11:40 AM    Scribe for Treatment  Team: Johny Shears, LCSW 12/05/2017 11:40 AM

## 2017-12-05 NOTE — Plan of Care (Signed)
Patient is alert and oriented to self and place. Patient pleasant; took medication, no loud outburst today; comes out and talks with peers in dayroom, no self injurious behaviors. Patient denies SI, HI and AVH. 15 minute checks continues. Problem: Activity: Goal: Will identify at least one activity in which they can participate Outcome: Progressing   Problem: Coping: Goal: Ability to identify and develop effective coping behavior will improve Outcome: Progressing Goal: Ability to interact with others will improve Outcome: Progressing Goal: Demonstration of participation in decision-making regarding own care will improve Outcome: Progressing Goal: Ability to use eye contact when communicating with others will improve Outcome: Progressing   Problem: Health Behavior/Discharge Planning: Goal: Identification of resources available to assist in meeting health care needs will improve Outcome: Progressing

## 2017-12-05 NOTE — Progress Notes (Signed)
Recreation Therapy Notes  Date: 12/05/2017  Time: 9:30 am   Location: Craft room   Behavioral response: N/A   Intervention Topic: Happiness  Discussion/Intervention: Patient did not attend group.   Clinical Observations/Feedback:  Patient did not attend group.   Oleg Oleson LRT/CTRS        Yaw Escoto 12/05/2017 1:23 PM

## 2017-12-05 NOTE — [facility] Group Notes (Signed)
  [facility] LCSW Group Therapy Note  Date/Time: 12/05/17, 1300  Type of Therapy/Topic:  Group Therapy:  Emotion Regulation  Participation Level:  Did Not Attend   Mood:  Description of Group:    The purpose of this group is to assist patients in learning to regulate negative emotions and experience positive emotions. Patients will be guided to discuss ways in which they have been vulnerable to their negative emotions. These vulnerabilities will be juxtaposed with experiences of positive emotions or situations, and patients challenged to use positive emotions to combat negative ones. Special emphasis will be placed on coping with negative emotions in conflict situations, and patients will process healthy conflict resolution skills.  Therapeutic Goals: 1. Patient will identify two positive emotions or experiences to reflect on in order to balance out negative emotions:  2. Patient will label two or more emotions that they find the most difficult to experience:  3. Patient will be able to demonstrate positive conflict resolution skills through discussion or role plays:   Summary of Patient Progress:       Therapeutic Modalities:   Cognitive Behavioral Therapy Feelings Identification Dialectical Behavioral Therapy  Greg Keison [place]inning, LCSW 

## 2017-12-05 NOTE — Progress Notes (Signed)
[facility] MD Progress Note  12/05/2017 1:00 PM Joanne Johnston  MRN:  [id number] Subjective:  Pt is slightly irritable today but no yelling outbursts. We reviewed the plan of referring her to more appropriate level of care and she agreed with this. She denies SI, HI, AH< Vh. She is medication compliant and more polite to staff.   Principal Problem: Unspecified mood (affective) disorder (HCC) Diagnosis:   Patient Active Problem List   Diagnosis Date Noted  . Unspecified mood (affective) disorder (HCC) [F39] 11/18/2017    Priority: High  . 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 Agitation [R45.1] 11/04/2017   Total Time spent with patient: 15 minutes  Past Psychiatric History: See H&P  Past Medical History:  Past Medical History:  Diagnosis Date  . Alcohol dependence (HCC)   . Anxiety   . Dementia   . Depression   . Hypertension   . Tachycardia     Past Surgical History:  Procedure Laterality Date  . CESAREAN SECTION     x2   Family History: History reviewed. No pertinent family history. Family Psychiatric  History: See H&P Social History:  Social History   Substance and Sexual Activity  Alcohol Use Not Currently  . Frequency: Never   Comment: states hasn't had a drink in months     Social History   Substance and Sexual Activity  Drug Use Never    Social History   Socioeconomic History  . Marital status: Divorced    Spouse name: Not on file  . Number of children: Not on file  . Years of education: Not on file  . Highest education level: Not on file  Occupational History  . Not on file  Social Needs  . Financial resource strain: Not on file  . Food insecurity:    Worry: Not on file    Inability: Not on file  . Transportation needs:    Medical: Not on file    Non-medical: Not on file  Tobacco Use  . Smoking status: Never Smoker  . Smokeless tobacco: Never Used   Substance and Sexual Activity  . Alcohol use: Not Currently    Frequency: Never    Comment: states hasn't had a drink in months  . Drug use: Never  . Sexual activity: Not Currently  Lifestyle  . Physical activity:    Days per week: Not on file    Minutes per session: Not on file  . Stress: Not on file  Relationships  . Social connections:    Talks on phone: Not on file    Gets together: Not on file    Attends religious service: Not on file    Active member of club or organization: Not on file    Attends meetings of clubs or organizations: Not on file    Relationship status: Not on file  Other Topics Concern  . Not on file  Social History Narrative  . Not on file   Additional Social History:                         Sleep: Good  Appetite:  Good  Current Medications: Current Facility-Administered Medications  Medication Dose Route Frequency Provider Last Rate Last Dose  . acetaminophen (TYLENOL) tablet 650 mg  650 mg Oral Q6H PRN Clapacs, Jackquline Denmark, MD      . alum & mag hydroxide-simeth (MAALOX/MYLANTA) 200-200-20 MG/5ML suspension  30 mL  30 mL Oral Q4H PRN Clapacs, John T, MD      . benztropine (COGENTIN) tablet 1 mg  1 mg Oral Q6H PRN Beverly Sessions, MD       Or  . benztropine mesylate (COGENTIN) injection 1 mg  1 mg Intramuscular Q6H PRN Beverly Sessions, MD      . carbamazepine (TEGRETOL) tablet 200 mg  200 mg Oral BID Khyree Carillo R, MD   200 mg at 12/04/17 1134  . haloperidol (HALDOL) tablet 5 mg  5 mg Oral Q6H PRN Beverly Sessions, MD   5 mg at 11/22/17 1646   Or  . haloperidol lactate (HALDOL) injection 5 mg  5 mg Intramuscular Q6H PRN Beverly Sessions, MD      . LORazepam (ATIVAN) injection 1 mg  1 mg Intramuscular Q6H PRN He, Jun, MD   1 mg at 11/22/17 0208  . LORazepam (ATIVAN) tablet 1 mg  1 mg Oral Q6H PRN Beverly Sessions, MD   1 mg at 11/22/17 1646   Or  . LORazepam (ATIVAN) injection 1 mg  1 mg Intramuscular Q6H PRN Beverly Sessions, MD       . magnesium hydroxide (MILK OF MAGNESIA) suspension 30 mL  30 mL Oral Daily PRN Clapacs, John T, MD      . QUEtiapine (SEROQUEL) tablet 300 mg  300 mg Oral QHS Denys Salinger, Ileene Hutchinson, MD   300 mg at 12/04/17 2017  . thiamine (VITAMIN B-1) tablet 100 mg  100 mg Oral Daily Beverly Sessions, MD   100 mg at 12/04/17 1134  . traZODone (DESYREL) tablet 100 mg  100 mg Oral QHS PRN He, Jun, MD   100 mg at 12/01/17 2142    Lab Results: No results found for this or any previous visit (from the past 48 hour(s)).  Blood Alcohol level:  Lab Results  Component Value Date   ETH <10 11/04/2017    Metabolic Disorder Labs: No results found for: HGBA1C, MPG No results found for: PROLACTIN No results found for: CHOL, TRIG, HDL, CHOLHDL, VLDL, LDLCALC  Physical Findings: AIMS: Facial and Oral Movements Muscles of Facial Expression: None, normal Lips and Perioral Area: None, normal Jaw: None, normal Tongue: None, normal,Extremity Movements Upper (arms, wrists, hands, fingers): None, normal Lower (legs, knees, ankles, toes): None, normal, Trunk Movements Neck, shoulders, hips: None, normal, Overall Severity Severity of abnormal movements (highest score from questions above): None, normal Incapacitation due to abnormal movements: None, normal Patient's awareness of abnormal movements (rate only patient's report): No Awareness, Dental Status Current problems with teeth and/or dentures?: No Does patient usually wear dentures?: No  CIWA:    COWS:     Musculoskeletal: Strength & Muscle Tone: within normal limits Gait & Station: normal Patient leans: N/A  Psychiatric Specialty Exam: Physical Exam  Nursing note and vitals reviewed.   Review of Systems  All other systems reviewed and are negative.   Blood pressure 128/89, pulse 75, temperature 98.1 F (36.7 C), temperature source Oral, resp. rate 20, height 5\' 8"  (1.727 m), weight 80.7 kg, SpO2 100 %.Body mass index is 27.06 kg/m.  General Appearance:  Disheveled  Eye Contact:  Fair  Speech:  Clear and Coherent  Volume:  Normal  Mood:  Irritable  Affect:  Labile  Thought Process:  Coherent and Goal Directed  Orientation:  Full (Time, Place, and Person)  Thought Content:  Logical  Suicidal Thoughts:  No  Homicidal Thoughts:  No  Memory:  Immediate;   Poor  Judgement:  Impaired  Insight:  Lacking  Psychomotor Activity:  Normal  Concentration:  Concentration: Poor  Recall:  Poor  Fund of Knowledge:  Fair  Language:  Fair  Akathisia:  No      Assets:  Resilience  ADL's:  Intact  Cognition:  Impaired,  Moderate  Sleep:  Number of Hours: 6.25     Treatment Plan Summary: 47 yo female admitted due to behavioral issues at memory care unit. This was much too restrictive level of care for her. She continue to have issues with short term memory but overall taking care of her ADLs. She is interacting well with peers.   Plan:  Mood -Continue Seroquel 300 mg qhs -Continue Tegretol 200 mg BID  Cognitive impairment/Korsakoff's syndrome --MOCA on 9/27 was 21/30 -PT evaluated and felt she was now independent with cares. -OT recommends supervised living due to poor short term memory issues and help with tracking day of the week and day to day management skills. This could be accomplished in an assisted living or groups home living situation  Dispo -Pt has legal guardian. She has contacted [facility] who may be willing to consider her. Her guardian has been actively looking for placement as well as ptscare coordinators. Plan to have conference call with care coordinators and guardian to determine next steps and to make sure we are all on same page with level of care and appropriate placement  Haskell Riling, MD 12/05/2017, 1:00 PM

## 2017-12-05 NOTE — Progress Notes (Signed)
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   PMH includes anxiety, depression, HTN, and tachycardia.      OT comments  Pt seen to review set up using monthly calendar to help with orientation.  She has been following through with this aide and appeared to be in a good mood with no lability during session and more positive outlook.  Reviewed a variety of apps that she can use on her phone after she is discharged to help with orgainization and recall of important deadlines, meetings, etc.  Not able to work on use of these apps since she is not able to have a cell phone or access to a computer while in [place].  Worked on layout of unit and was able to find [employer], activity room and needed cues to find the medication room.  Discussed using color of the walls to help her find needed rooms since they were in yellow or tan hallway and not the green hall.  Continue to rec pt be placed in a group home close to family if this is appropriate and where she has supervision but can be involved in meaningful activities that she enjoys.  All goals met for OT with recommendations to continue cognitive therapy at next venue.  Follow Up Recommendations  (rec supported living to help manage medications and IADLS)    Equipment Recommendations       Recommendations for Other Services      Precautions / Restrictions Precautions Precautions: None Restrictions Weight Bearing Restrictions: No       Mobility Bed Mobility                  Transfers                      Balance                                           ADL either performed or assessed with clinical judgement   ADL Overall ADL's : At baseline                                       General ADL Comments: Pt seen to review set up using monthly calendar to help with orientation.  She has been following through with this aide and appeared to be in a good mood with no lability during session and more positive outlook.  Reviewed a variety of apps that she can use on her phone after she is discharged to help with orgainization and recall of important deadlines, meetings, etc.  Not able to work on use of these apps since she is not able to have a cell phone or access to a computer while in [place].  Worked on layout of unit and was able to find [employer], activity room and needed cues to find the medication room.  Discussed using color of the walls to help her find needed rooms since they were in yellow or tan hallway and not the green hall.  Continue to rec pt be placed in a  group home close to family if this is appropriate and where she has supervision but can be involved in meaningful activities that she enjoys.       Vision       Perception     Praxis      Cognition Arousal/Alertness: Awake/alert Behavior During Therapy: WFL for tasks assessed/performed Overall Cognitive Status: Impaired/Different from baseline Area of Impairment: Memory                 Orientation Level: Disoriented to;Situation   Memory: Decreased short-term memory         General Comments: She continues to have impaired short term memory but is making progress with finding different rooms on unit without assist and is following a monthly calendar to help with orientation.         Exercises     Shoulder Instructions       General Comments      Pertinent Vitals/ Pain       Pain Assessment: No/denies pain  Home Living                                          Prior Functioning/Environment              Frequency           Progress Toward Goals  OT Goals(current goals can now be found in the care  plan section)  Progress towards OT goals: Goals met/education completed, patient discharged from [place] Discharge plan remains appropriate    Co-evaluation                 AM-PAC PT "6 Clicks" Daily Activity     Outcome Measure   Help from another person eating meals?: None Help from another person taking care of personal grooming?: None Help from another person toileting, which includes using toliet, bedpan, or urinal?: None Help from another person bathing (including washing, rinsing, drying)?: None Help from another person to put on and taking off regular upper body clothing?: None Help from another person to put on and taking off regular lower body clothing?: None 6 Click Score: 24    End of Session    OT Visit Diagnosis: Other symptoms and signs involving cognitive function   Activity Tolerance Patient tolerated treatment well   Patient Left Other (comment)(sitting EOB in her room)   Nurse Communication          Time: 6256-3893 OT Time Calculation (min): 25 min  Charges: OT General Charges $OT Visit: 1 Visit OT Treatments $Self Care/Home Management : 23-37 mins    Chrys Racer, OTR/L ascom [phone] 12/05/17, 3:12 PM

## 2017-12-06 NOTE — Progress Notes (Addendum)
Patient stayed in the dayroom until bedtime. Pleasant and cooperative. Alert and oriented and denying thoughts of self harm. Denying hallucinations. Maintained a positive attitude and interacted with staff and peers appropriately. Attended group, had a snack and medications. No major concern. Patient went to bed and has been sleeping. Support and encouragements provided. Safety precautions maintained at Q 15 mn level of observation.  0630: Joanne Johnston slept all night and was comfortable in bed. Refused to get up in the morning for vital signs. Staff continue to monitor for safety.

## 2017-12-06 NOTE — [facility] Group Notes (Signed)
LCSW Group Therapy Note  12/06/2017 1:15pm  Type of Therapy and Topic:  Group Therapy:  Healthy Self Image and Positive Change  Participation Level:  Active   Description of Group:  In this group, patients will compare and contrast their current "I am...." statements to the visions they identify as desirable for their lives.  Patients discuss fears and how they can make positive changes in their cognitions that will positively impact their behaviors.  Facilitator played a motivational 3-minute speech and patients were left with the task of thinking about what "I am...." statements they can start using in their lives immediately.  Therapeutic Goals: 1. Patient will state their current self-perception as expressed in an "I Am" statement 2. Patient will contrast this with their desired vision for their live 3. Patient will identify 3 fears that negatively impact their behavior 4. Patient will discuss cognitive distortions that stem from their fears 5. Patient will verbalize statements that challenge their cognitive distortions  Summary of Patient Progress:  The patient scored her mood at a 5 (10 best.) The patient stated, "I am smart" Patient discussed her fears and how she can make positive changes in their cognitions that will positively impact her behaviors. Patient was able to discuss and process cognitive distortions that stem from her fears. Patient actively and appropriately engaged in the group. Patient was able to provide support and validation to other group members. Patient practiced active listening when interacting with the facilitator and other group members.    Therapeutic Modalities Cognitive Behavioral Therapy Motivational Interviewing  Dewain Platz  [name], LCSW 12/06/2017 12:41 PM

## 2017-12-06 NOTE — Plan of Care (Signed)
Patient is alert and oriented to place and self. Patient denies SI, HI and AVH. Patient has moments when she is very pleasant and then switches to irritability. Patient did not have any outburst as of yet on the milieu, Patient took medications, held a pleasant conversation about sleeping well last night. Patient showers, dresses and acting appropriately on unit. Patient attended group therapy today with participation. No self injurious behaviors observed. 15 minute safety checks will continue. Problem: Coping: Goal: Ability to interact with others will improve Outcome: Progressing   Problem: Health Behavior/Discharge Planning: Goal: Identification of resources available to assist in meeting health care needs will improve Outcome: Progressing   Problem: Safety: Goal: Ability to remain free from injury will improve Outcome: Progressing

## 2017-12-06 NOTE — Plan of Care (Signed)
Pleasant and cooperative in the milieu, compliant with treatment

## 2017-12-06 NOTE — Progress Notes (Signed)
[facility] MD Progress Note  12/06/2017 10:58 AM Joanne Johnston  MRN:  [id number]    Subjective:  Patient was initially irritable with this writer but then later calm and cooperative.  He came and sat down and spoke with this Clinical research associate answering all questions to the best of her ability.  She was alert and oriented to year but not month or day of the week.  She was oriented to place and situation but continues to be frustrated with having to be in the hospital.  She misses her children and being in [place].  She would very much like to be in a home environment and does not want to be in a facility.  She denies any current active or passive suicidal thoughts or psychotic symptoms.  No auditory or visual hallucinations.  No paranoid thoughts or delusions.  She does have some insight into the fact that she has irritable mood swings and cannot control her behavior.  She did curse somewhat when expressing her frustration about being in the hospital but no agitation.  She does not feel like groups are helpful and does not want to go to groups.  She has not spoken with any family members recently.  Vital signs are stable.  She has not needed any as needed Ativan or Haldol recently and has been compliant with the Tegretol and Seroquel.  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 Hx of agitation and aggression at the facility.  However, she frequently refuse to take her medicine and became agitation and aggressive unpredictably, which led to frequent ED visits.    Family History: History reviewed. No pertinent family history.  Social History: The patient says her parents, aunts and uncles all live up [place] but her ex-husband and 2 children live in [place].  She is unemployed and disabled.  Principal  Problem: Unspecified mood (affective) disorder (HCC) Diagnosis:   Patient Active Problem List   Diagnosis Date Noted  . 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 Unspecified mood (affective) disorder (HCC) [F39] 11/18/2017  . 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 Agitation [R45.1] 11/04/2017   Total Time spent with patient: 15 minutes  Past Psychiatric History: See H&P  Past Medical History:  Past Medical History:  Diagnosis Date  . Alcohol dependence (HCC)   . Anxiety   . Dementia   . Depression   . Hypertension   . Tachycardia     Past Surgical History:  Procedure Laterality Date  . CESAREAN SECTION     x2   Family History: History reviewed. No pertinent family history. Family Psychiatric  History: See H&P Social History:  Social History   Substance and Sexual Activity  Alcohol Use Not Currently  . Frequency: Never   Comment: states hasn't had a drink in months     Social History   Substance and Sexual Activity  Drug Use Never    Social History   Socioeconomic History  . Marital status: Divorced    Spouse name: Not on file  . Number of children: Not on file  . Years of education: Not on file  . Highest education level: Not on file  Occupational History  . Not on file  Social Needs  . Financial resource strain: Not on file  . Food insecurity:    Worry: Not  on file    Inability: Not on file  . Transportation needs:    Medical: Not on file    Non-medical: Not on file  Tobacco Use  . Smoking status: Never Smoker  . Smokeless tobacco: Never Used  Substance and Sexual Activity  . Alcohol use: Not Currently    Frequency: Never    Comment: states hasn't had a drink in months  . Drug use: Never  . Sexual activity: Not Currently  Lifestyle  . Physical activity:    Days per week: Not on file    Minutes per session: Not on file  . Stress: Not on file  Relationships  . Social connections:    Talks on phone: Not on file     Gets together: Not on file    Attends religious service: Not on file    Active member of club or organization: Not on file    Attends meetings of clubs or organizations: Not on file    Relationship status: Not on file  Other Topics Concern  . Not on file  Social History Narrative  . Not on file        Sleep: Good  Appetite: Good  Current Medications: Current Facility-Administered Medications  Medication Dose Route Frequency Provider Last Rate Last Dose  . acetaminophen (TYLENOL) tablet 650 mg  650 mg Oral Q6H PRN Clapacs, John T, MD      . alum & mag hydroxide-simeth (MAALOX/MYLANTA) 200-200-20 MG/5ML suspension 30 mL  30 mL Oral Q4H PRN Clapacs, John T, MD      . benztropine (COGENTIN) tablet 1 mg  1 mg Oral Q6H PRN Beverly Sessions, MD       Or  . benztropine mesylate (COGENTIN) injection 1 mg  1 mg Intramuscular Q6H PRN Beverly Sessions, MD      . carbamazepine (TEGRETOL) tablet 200 mg  200 mg Oral BID McNew, Holly R, MD   200 mg at 12/06/17 1034  . haloperidol (HALDOL) tablet 5 mg  5 mg Oral Q6H PRN Beverly Sessions, MD   5 mg at 11/22/17 1646   Or  . haloperidol lactate (HALDOL) injection 5 mg  5 mg Intramuscular Q6H PRN Beverly Sessions, MD      . LORazepam (ATIVAN) injection 1 mg  1 mg Intramuscular Q6H PRN He, Jun, MD   1 mg at 11/22/17 0208  . LORazepam (ATIVAN) tablet 1 mg  1 mg Oral Q6H PRN Beverly Sessions, MD   1 mg at 11/22/17 1646   Or  . LORazepam (ATIVAN) injection 1 mg  1 mg Intramuscular Q6H PRN Beverly Sessions, MD      . magnesium hydroxide (MILK OF MAGNESIA) suspension 30 mL  30 mL Oral Daily PRN Clapacs, John T, MD      . QUEtiapine (SEROQUEL) tablet 300 mg  300 mg Oral QHS McNew, Ileene Hutchinson, MD   300 mg at 12/05/17 2149  . thiamine (VITAMIN B-1) tablet 100 mg  100 mg Oral Daily Beverly Sessions, MD   100 mg at 12/06/17 1034  . traZODone (DESYREL) tablet 100 mg  100 mg Oral QHS PRN He, Jun, MD   100 mg at 12/01/17 2142    Lab Results: No results  found for this or any previous visit (from the past 48 hour(s)).  Blood Alcohol level:  Lab Results  Component Value Date   ETH <10 11/04/2017    Metabolic Disorder Labs: No results found for: HGBA1C, MPG No results found for: PROLACTIN No results found  for: CHOL, TRIG, HDL, CHOLHDL, VLDL, LDLCALC  Physical Findings: AIMS: Facial and Oral Movements Muscles of Facial Expression: None, normal Lips and Perioral Area: None, normal Jaw: None, normal Tongue: None, normal,Extremity Movements Upper (arms, wrists, hands, fingers): None, normal Lower (legs, knees, ankles, toes): None, normal, Trunk Movements Neck, shoulders, hips: None, normal, Overall Severity Severity of abnormal movements (highest score from questions above): None, normal Incapacitation due to abnormal movements: None, normal Patient's awareness of abnormal movements (rate only patient's report): No Awareness, Dental Status Current problems with teeth and/or dentures?: No Does patient usually wear dentures?: No  CIWA:    COWS:     Musculoskeletal: Strength & Muscle Tone: within normal limits Gait & Station: normal Patient leans: N/A  Psychiatric Specialty Exam: Physical Exam  Nursing note and vitals reviewed.   Review of Systems  Constitutional: Negative.   HENT: Negative.   Eyes: Negative.   Respiratory: Negative.   Cardiovascular: Negative.   Gastrointestinal: Negative.   Musculoskeletal: Negative.   Skin: Negative.   Neurological: Negative.     Blood pressure 128/89, pulse 75, temperature 98.1 F (36.7 C), temperature source Oral, resp. rate 20, height 5\' 8"  (1.727 m), weight 80.7 kg, SpO2 100 %.Body mass index is 27.06 kg/m.  General Appearance: Casual  Eye Contact:  Good  Speech:  Normal Rate  Volume: Normal  Mood: "OK"  Affect:  Can be labile but calmer overall today and more polite  Thought Process:  Coherent and Goal Directed  Orientation:  Full (Time, Place, and Person)  Thought Content:   Logical and goal directed  Suicidal Thoughts:  No  Homicidal Thoughts:  No  Memory:  Immediate;   Poor  Judgement:  Impaired  Insight:  Lacking  Psychomotor Activity:  Normal  Concentration:  Concentration: Poor and Attention Span: Poor  Recall:  Poor  Fund of Knowledge:  Fair  Language:  Fair  Akathisia:  No      Assets:  Architect Physical Health Resilience  ADL's:  Intact  Cognition:  Impaired,  Moderate  Sleep:  Number of Hours: 8     Treatment Plan Summary: 47 yo female admitted due to behavioral issues at memory care unit. This was much too restrictive level of care for her. She continue to have issues with short term memory but overall taking care of her ADLs. She is interacting well with peers.   Plan:  Mood -The patient does have mood swings including anger outburst but overall is calmer -We will plan to continue Seroquel 300 mg p.o. nightly for mood stabilization as well as Tegretol 200 mg p.o. twice daily -Tegretol level was 8.1 and LFTs within normal limits -She has had PRN Ativan and Haldol but has not needed any PRN's recently  Cognitive impairment/Korsakoff's syndrome -MOCA on 9/27 was 21/30 - PT evaluation completed and she is now independent with canse -OT recommends supervised living due to poor short term memory issues and help with tracking day of the week and day to day management skills. This could be accomplished in an assisted living or groups home living situation  Dispo -Pt has legal guardian. She has contacted [facility] who may be willing to consider her. Her guardian has been actively looking for placement as well as ptscare coordinators. Plan to have conference call with care coordinators and guardian to determine next steps and to make sure we are all on same page with level of care and appropriate placement -She will need psychotropic medication  management follow-up appointment as well as  individual therapy.  Darliss Ridgel, MD 12/06/2017, 10:58 AM

## 2017-12-06 NOTE — Progress Notes (Signed)
Patient agreed to vital statistics this afternoon: BP of 105/88; 72 Pulse, respirations of 20; pain 0/10. No distress noted.

## 2017-12-07 NOTE — Plan of Care (Signed)
Patient slept majority of the morning, but has been observed out in the milieu this afternoon and also attended/participated in social work group without any issues. Patient has the ability to make decisions regarding her care and has used fair eye contact while communicating with staff and other members on the unit. Patient has the ability to identify the available resources that can assist her in meeting her health-care needs, however, she has not voiced anything to this Clinical research associate. Patient had no stated goals for today. Patient has been free from injury and remains safe on the unit at this time.  Problem: Activity: Goal: Will identify at least one activity in which they can participate Outcome: Progressing   Problem: Coping: Goal: Ability to identify and develop effective coping behavior will improve Outcome: Progressing Goal: Ability to interact with others will improve Outcome: Progressing Goal: Demonstration of participation in decision-making regarding own care will improve Outcome: Progressing Goal: Ability to use eye contact when communicating with others will improve Outcome: Progressing   Problem: Health Behavior/Discharge Planning: Goal: Identification of resources available to assist in meeting health care needs will improve Outcome: Progressing   Problem: Self-Concept: Goal: Will verbalize positive feelings about self Outcome: Progressing   Problem: Safety: Goal: Ability to remain free from injury will improve Outcome: Progressing

## 2017-12-07 NOTE — Plan of Care (Signed)
Pt. Interactions with staff and peers this evening appropriate. Pt. Able to verbalize feelings this evening appropriatly. Pt. Denies Si/hi, verbally is able to contract for safety.    Problem: Coping: Goal: Ability to interact with others will improve Outcome: Progressing   Problem: Self-Concept: Goal: Will verbalize positive feelings about self Outcome: Progressing   Problem: Safety: Goal: Ability to remain free from injury will improve Outcome: Progressing

## 2017-12-07 NOTE — Progress Notes (Signed)
D- Patient alert and oriented. Patient presents in a pleasant mood on assessment stating to this writer that she slept fine last night without voicing any major complaints or concerns. Patient denies SI, HI, AVH, and pain at this time. Patient also denies depression and anxiety. Patient had no stated goals for today.  A- Scheduled medications administered to patient, per MD orders. Support and encouragement provided.  Routine safety checks conducted every 15 minutes. Patient informed to notify staff with problems or concerns.  R- No adverse drug reactions noted. Patient contracts for safety at this time. Patient compliant with medications and treatment plan. Patient receptive, calm, and cooperative. Patient interacts well with others on the unit. Patient remains safe at this time.

## 2017-12-07 NOTE — [facility] Group Notes (Signed)
LCSW Group Therapy Note 12/07/2017 1:15pm  Type of Therapy and Topic: Group Therapy: Feelings Around Returning Home & Establishing a Supportive Framework and Supporting Oneself When Supports Not Available  Participation Level: Active  Description of Group:  Patients first processed thoughts and feelings about upcoming discharge. These included fears of upcoming changes, lack of change, new living environments, judgements and expectations from others and overall stigma of mental health issues. The group then discussed the definition of a supportive framework, what that looks and feels like, and how do to discern it from an unhealthy non-supportive network. The group identified different types of supports as well as what to do when your family/friends are less than helpful or unavailable  Therapeutic Goals  1. Patient will identify one healthy supportive network that they can use at discharge. 2. Patient will identify one factor of a supportive framework and how to tell it from an unhealthy network. 3. Patient able to identify one coping skill to use when they do not have positive supports from others. 4. Patient will demonstrate ability to communicate their needs through discussion and/or role plays.  Summary of Patient Progress:  The patient reported she feels "okay." Pt engaged during group session. As patients processed their anxiety about discharge and described healthy supports patient shared she is ready to be discharge. She reported "I dealt with my problems." She listed her family as her main support.  Patients identified at least one self-care tool they were willing to use after discharge; reading, talking to friends.   Therapeutic Modalities Cognitive Behavioral Therapy Motivational Interviewing   Joanne Johnston  [name], LCSW 12/07/2017 12:08 PM

## 2017-12-07 NOTE — Progress Notes (Signed)
[facility] MD Progress Note  12/07/2017 11:45 AM Raedyn Klinck  MRN:  [id number]    Subjective:  The patient has had some moments of irritability but has been mostly pleasant yesterday afternoon and this morning.  She did want to stay in her bed this morning after breakfast and did not want to speak to this writer initially but then later came out and was very appropriate and pleasant.  No anger outburst or major mood swings.  She continues to be frustrated with having to stay in the hospital.  Time spent discussing triggers for alcohol use in the past including stress and marital issues.  She denies any current active or passive suicidal thoughts.  She denies any auditory or visual hallucinations.  She denies any problems with insomnia and slept over 8 hours last night.  Vital signs are stable.  No somatic complaints.  She did attend and particiapte in group yesterday.   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 Hx of agitation and aggression at the facility.  However, she frequently refuse to take her medicine and became agitation and aggressive unpredictably, which led to frequent ED visits.    Family History: History reviewed. No pertinent family history.  Social History: The patient says her parents, aunts and uncles all live up [place] but her ex-husband and 2 children live in [place].  She is unemployed and disabled.  Principal Problem: Unspecified mood (affective) disorder (HCC) Diagnosis:   Patient Active Problem List   Diagnosis Date Noted  . 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 Unspecified mood (affective) disorder (HCC) [F39] 11/18/2017  . 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 Agitation [R45.1] 11/04/2017   Total Time spent with  patient: 15 minutes  Past Psychiatric History: See H&P  Past Medical History:  Past Medical History:  Diagnosis Date  . Alcohol dependence (HCC)   . Anxiety   . Dementia   . Depression   . Hypertension   . Tachycardia     Past Surgical History:  Procedure Laterality Date  . CESAREAN SECTION     x2   Family History: History reviewed. No pertinent family history. Family Psychiatric  History: See H&P Social History:  Social History   Substance and Sexual Activity  Alcohol Use Not Currently  . Frequency: Never   Comment: states hasn't had a drink in months     Social History   Substance and Sexual Activity  Drug Use Never    Social History   Socioeconomic History  . Marital status: Divorced    Spouse name: Not on file  . Number of children: Not on file  . Years of education: Not on file  . Highest education level: Not on file  Occupational History  . Not on file  Social Needs  . Financial resource strain: Not on file  . Food insecurity:    Worry: Not on file    Inability: Not on file  . Transportation needs:    Medical: Not on file    Non-medical: Not on file  Tobacco Use  . Smoking status: Never Smoker  . Smokeless tobacco: Never Used  Substance and Sexual Activity  . Alcohol use: Not Currently    Frequency: Never    Comment: states hasn't had a drink in months  . Drug use: Never  .  Sexual activity: Not Currently  Lifestyle  . Physical activity:    Days per week: Not on file    Minutes per session: Not on file  . Stress: Not on file  Relationships  . Social connections:    Talks on phone: Not on file    Gets together: Not on file    Attends religious service: Not on file    Active member of club or organization: Not on file    Attends meetings of clubs or organizations: Not on file    Relationship status: Not on file  Other Topics Concern  . Not on file  Social History Narrative  . Not on file        Sleep: Good  Appetite:  Good  Current Medications: Current Facility-Administered Medications  Medication Dose Route Frequency Provider Last Rate Last Dose  . acetaminophen (TYLENOL) tablet 650 mg  650 mg Oral Q6H PRN Clapacs, John T, MD      . alum & mag hydroxide-simeth (MAALOX/MYLANTA) 200-200-20 MG/5ML suspension 30 mL  30 mL Oral Q4H PRN Clapacs, John T, MD      . benztropine (COGENTIN) tablet 1 mg  1 mg Oral Q6H PRN Beverly Sessions, MD       Or  . benztropine mesylate (COGENTIN) injection 1 mg  1 mg Intramuscular Q6H PRN Beverly Sessions, MD      . carbamazepine (TEGRETOL) tablet 200 mg  200 mg Oral BID McNew, Holly R, MD   200 mg at 12/06/17 2127  . haloperidol (HALDOL) tablet 5 mg  5 mg Oral Q6H PRN Beverly Sessions, MD   5 mg at 11/22/17 1646   Or  . haloperidol lactate (HALDOL) injection 5 mg  5 mg Intramuscular Q6H PRN Beverly Sessions, MD      . LORazepam (ATIVAN) injection 1 mg  1 mg Intramuscular Q6H PRN He, Jun, MD   1 mg at 11/22/17 0208  . LORazepam (ATIVAN) tablet 1 mg  1 mg Oral Q6H PRN Beverly Sessions, MD   1 mg at 11/22/17 1646   Or  . LORazepam (ATIVAN) injection 1 mg  1 mg Intramuscular Q6H PRN Beverly Sessions, MD      . magnesium hydroxide (MILK OF MAGNESIA) suspension 30 mL  30 mL Oral Daily PRN Clapacs, John T, MD      . QUEtiapine (SEROQUEL) tablet 300 mg  300 mg Oral QHS McNew, Ileene Hutchinson, MD   300 mg at 12/06/17 2127  . thiamine (VITAMIN B-1) tablet 100 mg  100 mg Oral Daily Beverly Sessions, MD   100 mg at 12/06/17 1034  . traZODone (DESYREL) tablet 100 mg  100 mg Oral QHS PRN He, Jun, MD   100 mg at 12/01/17 2142    Lab Results: No results found for this or any previous visit (from the past 48 hour(s)).  Blood Alcohol level:  Lab Results  Component Value Date   ETH <10 11/04/2017    Metabolic Disorder Labs: No results found for: HGBA1C, MPG No results found for: PROLACTIN No results found for: CHOL, TRIG, HDL, CHOLHDL, VLDL, LDLCALC  Physical Findings: AIMS: Facial  and Oral Movements Muscles of Facial Expression: None, normal Lips and Perioral Area: None, normal Jaw: None, normal Tongue: None, normal,Extremity Movements Upper (arms, wrists, hands, fingers): None, normal Lower (legs, knees, ankles, toes): None, normal, Trunk Movements Neck, shoulders, hips: None, normal, Overall Severity Severity of abnormal movements (highest score from questions above): None, normal Incapacitation due to abnormal movements: None, normal Patient's  awareness of abnormal movements (rate only patient's report): No Awareness, Dental Status Current problems with teeth and/or dentures?: No Does patient usually wear dentures?: No  CIWA:    COWS:     Musculoskeletal: Strength & Muscle Tone: WNL Gait & Station: Normal Patient leans: NA  Psychiatric Specialty Exam: Physical Exam  Nursing note and vitals reviewed.   Review of Systems  Constitutional: Negative.   HENT: Negative.   Eyes: Negative.   Respiratory: Negative.   Cardiovascular: Negative.   Gastrointestinal: Negative.   Musculoskeletal: Negative.   Skin: Negative.   Neurological: Negative.     Blood pressure 105/88, pulse 72, temperature 98.1 F (36.7 C), temperature source Oral, resp. rate 20, height 5\' 8"  (1.727 m), weight 80.7 kg, SpO2 100 %.Body mass index is 27.06 kg/m.  General Appearance: Casual  Eye Contact:  Good  Speech: Normal rate  Volume:  Normal  Mood: "I feel fine"  Affect:  More pleasant and polite with brief moments of irritabiltiy  Thought Process:  Brief responses but coherent and goal directed  Orientation:  Full (Time, Place, and Person)  Thought Content:  Logical and goal directed  Suicidal Thoughts:  No  Homicidal Thoughts: No  Memory:  Immediate;   Fair Recent;   Fair Remote;   Fair  Judgement:  Impaired  Insight:  Lacking  Psychomotor Activity:  Normal  Concentration:  Concentration: Poor and Attention Span: Poor  Recall:  Poor  Fund of Knowledge:  Fair   Language:  Fair  Akathisia:  No      Assets:  Architect Physical Health Resilience  ADL's:  Intact  Cognition:  Impaired,  Moderate  Sleep:  Number of Hours: 7     Treatment Plan Summary: 47 yo female admitted due to behavioral issues at memory care unit. This was much too restrictive level of care for her. She continue to have issues with short term memory but overall taking care of her ADLs. She is interacting well with peers.   Plan:  Mood --She has had moments of very well overall is much calmer and more pleasant. - We will plan to continue several 300 mg p.o. nightly for mood stabilization as well as Tegretol 200 mg p.o. twice daily. -Tegretol level was 8.1 and LFTs were within normal limits. -She has as needed Ativan and Haldol but has not needed any PRN's recently.   Cognitive impairment/Korsakoff's syndrome -MOCA on 9/27 was 21/30 - PT evaluation completed and she is now independent with canse -OT recommends supervised living due to poor short term memory issues and help with tracking day of the week and day to day management skills. This could be accomplished in an assisted living or groups home living situation  Dispo -Pt has legal guardian. She has contacted [facility] who may be willing to consider her. Her guardian has been actively looking for placement as well as ptscare coordinators. Plan to have conference call with care coordinators and guardian to determine next steps and to make sure we are all on same page with level of care and appropriate placement -She will need psychotropic medication management follow-up appointment as well as individual therapy. -The patient is wanting to return to [place] where her parents are   Darliss Ridgel, MD 12/07/2017, 11:45 AM

## 2017-12-07 NOTE — Progress Notes (Signed)
Patient refused her Tegretol, she asked this writer what it was for and she stated "I'll take the B1". MD notified.

## 2017-12-07 NOTE — Progress Notes (Signed)
D: Pt denies SI/HI/AVH, verbally able to contract for safety. Pt is pleasant and cooperative, no behavioral issues. Pt. has no Complaints.  Patient Interactions appropriate this evening. Pt. Affect brighter this evening.   A: Q x 15 minute observation checks were completed for safety. Patient was provided with education, but needs reinforcement.  Patient was given/offered medications per orders. Patient  was encourage to attend groups, participate in unit activities and continue with plan of care. Pt. Chart and plans of care reviewed. Pt. Given support and encouragement.   R: Patient is complaint with medication and unit procedures with direction and encouragement.              Precautionary checks every 15 minutes for safety maintained, room free of safety hazards, patient sustains no injury or falls during this shift. Will endorse care to next shift.

## 2017-12-08 NOTE — Progress Notes (Signed)
[facility] MD Progress Note  12/08/2017 1:01 PM Joanne Johnston  MRN:  [id number] Subjective:  Pt calm and pleasant with this provider today. She has had less outbursts on the unit per staff. Discussed with her today that we continue to search for appropriate placement for her in hopefully an unlocked facility. She is thankful for this. She is organized in thought process. She is sleeping well and eating well. She is interacting well with other peers. She continues to have issues with short term memory loss and often forgets conversations from the day before.   Principal Problem: Unspecified mood (affective) disorder (HCC) Diagnosis:   Patient Active Problem List   Diagnosis Date Noted  . Unspecified mood (affective) disorder (HCC) [F39] 11/18/2017    Priority: High  . 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 Agitation [R45.1] 11/04/2017   Total Time spent with patient: 15 minutes  Past Psychiatric History: See H&P  Past Medical History:  Past Medical History:  Diagnosis Date  . Alcohol dependence (HCC)   . Anxiety   . Dementia   . Depression   . Hypertension   . Tachycardia     Past Surgical History:  Procedure Laterality Date  . CESAREAN SECTION     x2   Family History: History reviewed. No pertinent family history. Family Psychiatric  History: See H&P Social History:  Social History   Substance and Sexual Activity  Alcohol Use Not Currently  . Frequency: Never   Comment: states hasn't had a drink in months     Social History   Substance and Sexual Activity  Drug Use Never    Social History   Socioeconomic History  . Marital status: Divorced    Spouse name: Not on file  . Number of children: Not on file  . Years of education: Not on file  . Highest education level: Not on file  Occupational History  . Not on file  Social Needs  . Financial resource strain: Not on file  .  Food insecurity:    Worry: Not on file    Inability: Not on file  . Transportation needs:    Medical: Not on file    Non-medical: Not on file  Tobacco Use  . Smoking status: Never Smoker  . Smokeless tobacco: Never Used  Substance and Sexual Activity  . Alcohol use: Not Currently    Frequency: Never    Comment: states hasn't had a drink in months  . Drug use: Never  . Sexual activity: Not Currently  Lifestyle  . Physical activity:    Days per week: Not on file    Minutes per session: Not on file  . Stress: Not on file  Relationships  . Social connections:    Talks on phone: Not on file    Gets together: Not on file    Attends religious service: Not on file    Active member of club or organization: Not on file    Attends meetings of clubs or organizations: Not on file    Relationship status: Not on file  Other Topics Concern  . Not on file  Social History Narrative  . Not on file   Additional Social History:                         Sleep: Good  Appetite:  Good  Current Medications: Current Facility-Administered Medications  Medication Dose  Route Frequency Provider Last Rate Last Dose  . acetaminophen (TYLENOL) tablet 650 mg  650 mg Oral Q6H PRN Clapacs, John T, MD      . alum & mag hydroxide-simeth (MAALOX/MYLANTA) 200-200-20 MG/5ML suspension 30 mL  30 mL Oral Q4H PRN Clapacs, John T, MD      . benztropine (COGENTIN) tablet 1 mg  1 mg Oral Q6H PRN Beverly Sessions, MD       Or  . benztropine mesylate (COGENTIN) injection 1 mg  1 mg Intramuscular Q6H PRN Beverly Sessions, MD      . carbamazepine (TEGRETOL) tablet 200 mg  200 mg Oral BID Eustacia Urbanek R, MD   200 mg at 12/08/17 0820  . haloperidol (HALDOL) tablet 5 mg  5 mg Oral Q6H PRN Beverly Sessions, MD   5 mg at 11/22/17 1646   Or  . haloperidol lactate (HALDOL) injection 5 mg  5 mg Intramuscular Q6H PRN Beverly Sessions, MD      . LORazepam (ATIVAN) injection 1 mg  1 mg Intramuscular Q6H PRN He,  Jun, MD   1 mg at 11/22/17 0208  . LORazepam (ATIVAN) tablet 1 mg  1 mg Oral Q6H PRN Beverly Sessions, MD   1 mg at 11/22/17 1646   Or  . LORazepam (ATIVAN) injection 1 mg  1 mg Intramuscular Q6H PRN Beverly Sessions, MD      . magnesium hydroxide (MILK OF MAGNESIA) suspension 30 mL  30 mL Oral Daily PRN Clapacs, John T, MD      . QUEtiapine (SEROQUEL) tablet 300 mg  300 mg Oral QHS Chancey Ringel, Ileene Hutchinson, MD   300 mg at 12/07/17 2145  . thiamine (VITAMIN B-1) tablet 100 mg  100 mg Oral Daily Beverly Sessions, MD   100 mg at 12/08/17 0820  . traZODone (DESYREL) tablet 100 mg  100 mg Oral QHS PRN He, Jun, MD   100 mg at 12/01/17 2142    Lab Results: No results found for this or any previous visit (from the past 48 hour(s)).  Blood Alcohol level:  Lab Results  Component Value Date   ETH <10 11/04/2017    Metabolic Disorder Labs: No results found for: HGBA1C, MPG No results found for: PROLACTIN No results found for: CHOL, TRIG, HDL, CHOLHDL, VLDL, LDLCALC  Physical Findings: AIMS: Facial and Oral Movements Muscles of Facial Expression: None, normal Lips and Perioral Area: None, normal Jaw: None, normal Tongue: None, normal,Extremity Movements Upper (arms, wrists, hands, fingers): None, normal Lower (legs, knees, ankles, toes): None, normal, Trunk Movements Neck, shoulders, hips: None, normal, Overall Severity Severity of abnormal movements (highest score from questions above): None, normal Incapacitation due to abnormal movements: None, normal Patient's awareness of abnormal movements (rate only patient's report): No Awareness, Dental Status Current problems with teeth and/or dentures?: No Does patient usually wear dentures?: No  CIWA:    COWS:     Musculoskeletal: Strength & Muscle Tone: within normal limits Gait & Station: normal Patient leans: N/A  Psychiatric Specialty Exam: Physical Exam  Nursing note and vitals reviewed.   Review of Systems  All other systems reviewed  and are negative.   Blood pressure 107/85, pulse 71, temperature 98 F (36.7 C), temperature source Oral, resp. rate 16, height 5\' 8"  (1.727 m), weight 80.7 kg, SpO2 100 %.Body mass index is 27.06 kg/m.  General Appearance: Casual  Eye Contact:  Good  Speech:  Clear and Coherent  Volume:  Normal  Mood:  Euthymic  Affect:  Appropriate  Thought Process:  Coherent and Goal Directed  Orientation:  Full (Time, Place, and Person)  Thought Content:  Logical  Suicidal Thoughts:  No  Homicidal Thoughts:  No  Memory:  Immediate;   Poor  Judgement:  Impaired  Insight:  Lacking  Psychomotor Activity:  Normal  Concentration:  Concentration: Poor  Recall:  Poor  Fund of Knowledge:  Poor  Language:  Fair  Akathisia:  No      Assets:  Resilience  ADL's:  Intact  Cognition:  Impaired,  Moderate  Sleep:  Number of Hours: 7     Treatment Plan Summary: 47 yo female admitted due to behavioral issues at memory care unit. I continue to feel that a locked unit is much too restrictive level of care for her causing her to act out. She continues to have issues with short term memory loss so would benefit from some sort of supervised living environment such as a group home. Will need to have conference call with her guardian and her case managers to discuss placement as this continues to be an issue.  Plan:  Mood -Continue Seroquel 300 mg qhs -Continue Tegretol 200 mg BID  Korsakoff's syndrome ---MOCA on 9/27 was 21/30 -PT evaluated and felt she was now independent with cares. -OT recommends supervised living due to poor short term memory issues and help with tracking day of the week and day to day management skills. This could be accomplished in an assisted living or groups home living situation  Dispo -Placement is an issue currently. Will need to have conference all with guardian and care managers this week to discuss options.  Haskell Riling, MD 12/08/2017, 1:01 PM

## 2017-12-08 NOTE — Progress Notes (Signed)
Patient isolative to self and room. Labile, irritable with staff at times. Medication compliant. Stays in room sleeping most of day. Encouragement and support offered. Safety checks maintained. Pt receptive and remains safe on unit with q 15 min checks.

## 2017-12-08 NOTE — Progress Notes (Signed)
D:Pt denies SI/HI/AVH, verbally able to contract for safety. Pt is pleasant and cooperative, no behavioral issues to report. Pt.has no Complaints.Patient Interactions appropriate this evening. Pt. Frequently observed in the day room socializing appropriately with peers and watching tv. Pt. Upon approach is cooperative and compliance is easily gained for unit activities or medications. Pt. Denies pain. Denies anxiety or depression. Pt. Engages more during assessments with this Clinical research associate and converses in conversation about her day. Pt. Smiling more this evening during assessments.   A: Q x 15 minute observation checks were completed for safety. Patient was provided with education, but needs reinforcement. Patient was given/offered medications per orders. Patient was encourage to attend groups, participate in unit activities and continue with plan of care. Pt. Chart and plans of care reviewed. Pt. Given support and encouragement.  R:Patient is complaint with medication and unit procedures with direction and encouragement.              Precautionary checks every 15 minutes for safety maintained, room free of safety hazards, patient sustains no injury or falls during this shift. Will endorse care to next shift.

## 2017-12-08 NOTE — [facility] Group Notes (Signed)
  LCSW Group Therapy Note Monday October 14/2019  Type of Therapy and Topic:  Group Therapy:  Overcoming Obstacles   Participation Level:  Good Participation   Description of Group:   In this group patients will be encouraged to explore what they see as obstacles to their own wellness and recovery. They will be guided to discuss their thoughts, feelings, and behaviors related to these obstacles. The group will process together ways to cope with barriers, with attention given to specific choices patients can make. Each patient will be challenged to identify changes they are motivated to make in order to overcome their obstacles. This group will be process-oriented, with patients participating in exploration of their own experiences, giving and receiving support, and processing challenge from other group members.   Therapeutic Goals: 1. Patient will identify personal and current obstacles as they relate to admission. 2. Patient will identify barriers that currently interfere with their wellness or overcoming obstacles.  3. Patient will identify feelings, thought process and behaviors related to these barriers. 4. Patient will identify two changes they are willing to make to overcome these obstacles:      Summary of Patient Progress   Joanne Johnston was able to reflect her thoughts and feelings today with alertness.She reports she is not anxious of anything at this time. Patient was attentive and listened to her peers.    Therapeutic Modalities:   Cognitive Behavioral Therapy Solution Focused Therapy Motivational Interviewing Relapse Prevention Therapy  Asyah Candler LCSW

## 2017-12-08 NOTE — Plan of Care (Signed)
  Problem: Coping: Goal: Ability to interact with others will improve Outcome: Progressing Goal: Demonstration of participation in decision-making regarding own care will improve Outcome: Progressing   

## 2017-12-08 NOTE — Plan of Care (Signed)
Pt. Interactions with staff and peers appropriate. Pt. Complaint with medications. Pt. Denies si/hi, verbally is able to contract for safety. Pt. Verbalizes more this evening on her feelings. Denies depression and anxiety.    Problem: Coping: Goal: Ability to interact with others will improve Outcome: Progressing   Problem: Self-Concept: Goal: Will verbalize positive feelings about self Outcome: Progressing   Problem: Safety: Goal: Ability to remain free from injury will improve Outcome: Progressing

## 2017-12-09 NOTE — Progress Notes (Signed)
[facility] MD Progress Note  12/09/2017 1:53 PM Joanne Johnston  MRN:  [id number] Subjective:  Pt has been calm on the unit. She is oriented to year, state and president. She corrects herself from November to October. She has been taking her medications usually with encouragement. She has not had any outbursts in several days.   Principal Problem: Unspecified mood (affective) disorder (HCC) Diagnosis:   Patient Active Problem List   Diagnosis Date Noted  . Unspecified mood (affective) disorder (HCC) [F39] 11/18/2017    Priority: High  . 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 Agitation [R45.1] 11/04/2017   Total Time spent with patient: 15 minutes  Past Psychiatric History: See H&P  Past Medical History:  Past Medical History:  Diagnosis Date  . Alcohol dependence (HCC)   . Anxiety   . Dementia   . Depression   . Hypertension   . Tachycardia     Past Surgical History:  Procedure Laterality Date  . CESAREAN SECTION     x2   Family History: History reviewed. No pertinent family history. Family Psychiatric  History: See H&P Social History:  Social History   Substance and Sexual Activity  Alcohol Use Not Currently  . Frequency: Never   Comment: states hasn't had a drink in months     Social History   Substance and Sexual Activity  Drug Use Never    Social History   Socioeconomic History  . Marital status: Divorced    Spouse name: Not on file  . Number of children: Not on file  . Years of education: Not on file  . Highest education level: Not on file  Occupational History  . Not on file  Social Needs  . Financial resource strain: Not on file  . Food insecurity:    Worry: Not on file    Inability: Not on file  . Transportation needs:    Medical: Not on file    Non-medical: Not on file  Tobacco Use  . Smoking status: Never Smoker  . Smokeless tobacco: Never Used  Substance  and Sexual Activity  . Alcohol use: Not Currently    Frequency: Never    Comment: states hasn't had a drink in months  . Drug use: Never  . Sexual activity: Not Currently  Lifestyle  . Physical activity:    Days per week: Not on file    Minutes per session: Not on file  . Stress: Not on file  Relationships  . Social connections:    Talks on phone: Not on file    Gets together: Not on file    Attends religious service: Not on file    Active member of club or organization: Not on file    Attends meetings of clubs or organizations: Not on file    Relationship status: Not on file  Other Topics Concern  . Not on file  Social History Narrative  . Not on file   Additional Social History:                         Sleep: Good  Appetite:  Good  Current Medications: Current Facility-Administered Medications  Medication Dose Route Frequency Provider Last Rate Last Dose  . acetaminophen (TYLENOL) tablet 650 mg  650 mg Oral Q6H PRN Clapacs, John T, MD      . alum & mag hydroxide-simeth (MAALOX/MYLANTA) 200-200-20 MG/5ML suspension 30 mL  30 mL Oral Q4H PRN Clapacs, Jackquline Denmark, MD      . benztropine (COGENTIN) tablet 1 mg  1 mg Oral Q6H PRN Beverly Sessions, MD       Or  . benztropine mesylate (COGENTIN) injection 1 mg  1 mg Intramuscular Q6H PRN Beverly Sessions, MD      . carbamazepine (TEGRETOL) tablet 200 mg  200 mg Oral BID Zaidan Keeble R, MD   200 mg at 12/08/17 2133  . haloperidol (HALDOL) tablet 5 mg  5 mg Oral Q6H PRN Beverly Sessions, MD   5 mg at 11/22/17 1646   Or  . haloperidol lactate (HALDOL) injection 5 mg  5 mg Intramuscular Q6H PRN Beverly Sessions, MD      . LORazepam (ATIVAN) injection 1 mg  1 mg Intramuscular Q6H PRN He, Jun, MD   1 mg at 11/22/17 0208  . LORazepam (ATIVAN) tablet 1 mg  1 mg Oral Q6H PRN Beverly Sessions, MD   1 mg at 11/22/17 1646   Or  . LORazepam (ATIVAN) injection 1 mg  1 mg Intramuscular Q6H PRN Beverly Sessions, MD      .  magnesium hydroxide (MILK OF MAGNESIA) suspension 30 mL  30 mL Oral Daily PRN Clapacs, John T, MD      . QUEtiapine (SEROQUEL) tablet 300 mg  300 mg Oral QHS Aoki Wedemeyer, Ileene Hutchinson, MD   300 mg at 12/08/17 2134  . thiamine (VITAMIN B-1) tablet 100 mg  100 mg Oral Daily Beverly Sessions, MD   100 mg at 12/09/17 1308  . traZODone (DESYREL) tablet 100 mg  100 mg Oral QHS PRN He, Jun, MD   100 mg at 12/01/17 2142    Lab Results: No results found for this or any previous visit (from the past 48 hour(s)).  Blood Alcohol level:  Lab Results  Component Value Date   ETH <10 11/04/2017    Metabolic Disorder Labs: No results found for: HGBA1C, MPG No results found for: PROLACTIN No results found for: CHOL, TRIG, HDL, CHOLHDL, VLDL, LDLCALC  Physical Findings: AIMS: Facial and Oral Movements Muscles of Facial Expression: None, normal Lips and Perioral Area: None, normal Jaw: None, normal Tongue: None, normal,Extremity Movements Upper (arms, wrists, hands, fingers): None, normal Lower (legs, knees, ankles, toes): None, normal, Trunk Movements Neck, shoulders, hips: None, normal, Overall Severity Severity of abnormal movements (highest score from questions above): None, normal Incapacitation due to abnormal movements: None, normal Patient's awareness of abnormal movements (rate only patient's report): No Awareness, Dental Status Current problems with teeth and/or dentures?: No Does patient usually wear dentures?: No  CIWA:    COWS:     Musculoskeletal: Strength & Muscle Tone: within normal limits Gait & Station: normal Patient leans: N/A  Psychiatric Specialty Exam: Physical Exam  Nursing note and vitals reviewed.   Review of Systems  All other systems reviewed and are negative.   Blood pressure 107/85, pulse 71, temperature 98 F (36.7 C), temperature source Oral, resp. rate 16, height 5\' 8"  (1.727 m), weight 80.7 kg, SpO2 100 %.Body mass index is 27.06 kg/m.  General Appearance:  Disheveled  Eye Contact:  Minimal  Speech:  Clear and Coherent  Volume:  Normal  Mood:  Irritable  Affect:  Congruent  Thought Process:  Coherent and Goal Directed  Orientation:  Full (Time, Place, and Person)  Thought Content:  Logical  Suicidal Thoughts:  No  Homicidal Thoughts:  No  Memory:  Recent;   Poor  Judgement:  Impaired  Insight:  Lacking  Psychomotor Activity:  Normal  Concentration:  Attention Span: Poor  Recall:  Poor  Fund of Knowledge:  Fair  Language:  Fair  Akathisia:  No      Assets:  Resilience  ADL's:  Intact  Cognition:  Impaired,  Moderate  Sleep:  Number of Hours: 7     Treatment Plan Summary: 47 yo female admitted due to behaviors issues at memory care unit. She has been calm and cooperative on the unit. No outbursts in several days. She is taking medications. She continues to have issues with short term memory and requires some supervision at discharge likely into a group home. Placement has been a major issue with her.   Plan:  Mood -Continue Seroquel 300 mg qhs -Continue Tegretol 200 mg BID  Korsakoff's syndrome ---MOCA on 9/27 was 21/30 -PT evaluated and felt she was now independent with cares. -OT recommends supervised living due to poor short term memory issues and help with tracking day of the week and day to day management skills. This could be accomplished in an assisted living or groups home living situation  Dispo -Placement is an issue currently. Will need to have conference all with guardian and care managers this week to discuss options.  Haskell Riling, MD 12/09/2017, 1:53 PM

## 2017-12-09 NOTE — Plan of Care (Signed)
Patient continues to sleep throughout the morning, but has been observed out in the milieu this afternoon and has interacted well with the other members on the unit without any issues at this time. Patient has the ability to make decisions regarding her care and has used fair eye contact while communicating with this Clinical research associate. Patient has the ability to identify the available resources that can assist her in meeting her health-care needs, however, she has not voiced anything to this Clinical research associate. Patient had no stated goals for today. Patient has been free from injury and remains safe on the unit at this time.  Problem: Activity: Goal: Will identify at least one activity in which they can participate Outcome: Progressing   Problem: Coping: Goal: Ability to identify and develop effective coping behavior will improve Outcome: Progressing Goal: Ability to interact with others will improve Outcome: Progressing Goal: Demonstration of participation in decision-making regarding own care will improve Outcome: Progressing Goal: Ability to use eye contact when communicating with others will improve Outcome: Progressing   Problem: Health Behavior/Discharge Planning: Goal: Identification of resources available to assist in meeting health care needs will improve Outcome: Progressing   Problem: Self-Concept: Goal: Will verbalize positive feelings about self Outcome: Progressing   Problem: Safety: Goal: Ability to remain free from injury will improve Outcome: Progressing

## 2017-12-09 NOTE — Progress Notes (Signed)
Recreation Therapy Notes  Date: 12/09/2017  Time: 9:30 am   Location: Craft room   Behavioral response: N/A   Intervention Topic: Life planning  Discussion/Intervention: Patient did not attend group.   Clinical Observations/Feedback:  Patient did not attend group.   Nekita Pita LRT/CTRS        Tiann Saha 12/09/2017 10:47 AM 

## 2017-12-09 NOTE — Progress Notes (Signed)
D:Pt denies SI/HI/AVH, verbally able to contract for safety. Pt is pleasant and cooperative overall. Pt.has noComplaints.Patient Interactions appropriate this evening. Pt. Frequently observed in the day room, but less social this evening, watching tv. Pt. Upon approach is cooperative and compliance is easily gained for unit activities or medications again. Pt. Denies pain. Denies anxiety or depression. Pt. Engages, but during assessments with this Clinical research associate our conversation is limited and not as engaging as previous assessments. Pt. Continues to Smiling more this evening during assessments when having conversation.    A: Q x 15 minute observation checks were completed for safety. Patient was provided with education, but needs reinforcement.Patient was given/offered medications per orders. Patient was encourage to attend groups, participate in unit activities and continue with plan of care. Pt. Chart and plans of care reviewed. Pt. Given support and encouragement.  R:Patient is complaint with medication and unit procedureswith direction and encouragement. Pt. Continues to be resistant to having her vital signs completed as needed per orders. Otherwise no behavioral issues to report.             Precautionary checks every 15 minutes for safety maintained, room free of safety hazards, patient sustains no injury or falls during this shift. Will endorse care to next shift.

## 2017-12-09 NOTE — BHH Counselor (Signed)
CSW called Joanne Johnston [phone]) from [place]. She mentions that she spoke with the guardian Joanne Johnston and Joanne Johnston explained that she is leaving DSS and her last day is December 12, 2017. The case will be transferred to another worker but they are unsure of who. Joanne Johnston says that [facility] was reviewing documents because they have had issues with locating the best placement option for the patient. They determined that recommending medical testing to be done to rule out medical brain disorder. They don't have a lot of hx of her having significant Substance abuse treatment. 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 They recommend: neurology consult for etiologies, considerations to NMDA and a secure nursing facility was recommended with community support with a [facility]. She is not appropriate for a state medical facility. Joanne Johnston will send CSW an e-mail. She does not meet criteria for ALF or group home due to not having a severe persistent mental illness. She recommends [facility] or ALF. Guardian still believes that she needs a locked unit. CSW suggested that a phone conference will need to be scheduled to further discuss the case and discharge plans.   Joanne Johnston, MSW, Theresia Majors, Bridget Hartshorn Clinical Social Worker 12/09/2017 2:27 PM

## 2017-12-09 NOTE — Plan of Care (Signed)
Pt. Interactions with staff and peers appropriate this evening. No behavioral issues to report. Pt. Denies si/hi, verbally will contract for safety.   Problem: Coping: Goal: Ability to interact with others will improve Outcome: Progressing   Problem: Safety: Goal: Ability to remain free from injury will improve Outcome: Progressing

## 2017-12-09 NOTE — Progress Notes (Signed)
Patient refused her Tegretol. This writer will notify MD. 

## 2017-12-09 NOTE — Progress Notes (Signed)
D- Patient alert and oriented. Patient presents in a pleasant mood on assessment and had no major complaints or concerns to voice to this Clinical research associate. Patient denies SI, HI, AVH, and pain at this time. Patient also denies any signs/symptoms of depression/anxiety. Patient had no stated goals for today.  A- Scheduled medications administered to patient, per MD orders. Support and encouragement provided.  Routine safety checks conducted every 15 minutes.  Patient informed to notify staff with problems or concerns.  R- No adverse drug reactions noted. Patient contracts for safety at this time. Patient compliant with medications and treatment plan. Patient receptive, calm, and cooperative. Patient interacts well with others on the unit.  Patient remains safe at this time.

## 2017-12-10 NOTE — Plan of Care (Signed)
Patient is alert to self and place. Patient is pleasant and calm, interacts with certain peers on the unit. Patient is assertive and logical; and mild confusion. Patient denies SI, HI and AVH. Patient is properly groomed, and eating meals, denies pain 0/10. Patient ability to interact with others has improved, patient attend groups, ability to make eye contact has improved as well. No self injurious behaviors noted. Safety checks Q 15 minutes by MHT will continue. Problem: Coping: Goal: Ability to interact with others will improve Outcome: Progressing   Problem: Health Behavior/Discharge Planning: Goal: Identification of resources available to assist in meeting health care needs will improve Outcome: Progressing   Problem: Self-Concept: Goal: Will verbalize positive feelings about self Outcome: Progressing   Problem: Safety: Goal: Ability to remain free from injury will improve Outcome: Progressing

## 2017-12-10 NOTE — Plan of Care (Signed)
Pt. Denies si/hi, verbally is able to contract for safety. Pt. Verbalizes feelings this evening appropriately. Pt. Interactions with staff and peers appropriate.    Problem: Coping: Goal: Ability to interact with others will improve Outcome: Progressing   Problem: Self-Concept: Goal: Will verbalize positive feelings about self Outcome: Progressing   Problem: Safety: Goal: Ability to remain free from injury will improve Outcome: Progressing

## 2017-12-10 NOTE — Progress Notes (Signed)
[facility] MD Progress Note  12/10/2017 2:28 PM Embree Brawley  MRN:  [id number] Subjective:  Pt is calm and cooperative with this provider this afternoon. She is attending groups. She has bright affect. We discussed the continued plan to find placement and some of the barriers with this. She continues to hope for an unlocked facility with some supervision. She is being very patient with this process. She is taking medications and denies having any complaints. She has not endorsed any SI or thoughts of self harm. She is fully oriented today to month, year, president, city and state.   Reviewed e-mail correspondence with her case manger with [facility]. It appears there has been some consultation done with a physical through them. They recommended a neurology consult for workup of other etilogies such as neurosyphilis, NMDA. Neurology was consulted during her previous extended hospitalization. Given her long history of alcohol use (2 bottles of wine or 1/5 of vodka a day) and very poor oral intake as well as  presentation of unsteady gait, amnesia and ataxia, she was diagnosed with Wernicke-Korsakoff. MRI brain and CT done were done at that time and unremarkable. RPR done as well which was negative so this rules out neurosyphilis. She has no symptoms at all suggestive of anti-NMDA that warrant work up for this (including afebrile, vitals have been stable, no psychosis at all). It was also noted that there was agreement that a secure nursing facility is not the ideal setting for her and would be more successful in group home/ALF setting with community supports as restrictive setting contributed to some of the behavioral issues.  She was declined from admission to state neuro-medical center and is not appropriate for state psychiatric hospital.   Principal Problem: Unspecified mood (affective) disorder ([place]) Diagnosis:   Patient Active Problem List   Diagnosis Date Noted  . Unspecified mood (affective) disorder  ([place]) [F39] 11/18/2017    Priority: High  . 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 Korsakoff syndrome ([place]) [X65] 12/04/2017  . Agitation [R45.1] 11/04/2017   Total Time spent with patient: 30 minutes  Past Psychiatric History: See H&P  Past Medical History:  Past Medical History:  Diagnosis Date  . Alcohol dependence ([place])   . Anxiety   . Dementia   . Depression   . Hypertension   . Tachycardia     Past Surgical History:  Procedure Laterality Date  . CESAREAN SECTION     x2   Family History: History reviewed. No pertinent family history. Family Psychiatric  History: See H&P Social History:  Social History   Substance and Sexual Activity  Alcohol Use Not Currently  . Frequency: Never   Comment: states hasn't had a drink in months     Social History   Substance and Sexual Activity  Drug Use Never    Social History   Socioeconomic History  . Marital status: Divorced    Spouse name: Not on file  . Number of children: Not on file  . Years of education: Not on file  . Highest education level: Not on file  Occupational History  . Not on file  Social Needs  . Financial resource strain: Not on file  . Food insecurity:    Worry: Not on file    Inability: Not on file  . Transportation needs:    Medical: Not on file    Non-medical: Not on file  Tobacco Use  . Smoking status: Never Smoker  . Smokeless  tobacco: Never Used  Substance and Sexual Activity  . Alcohol use: Not Currently    Frequency: Never    Comment: states hasn't had a drink in months  . Drug use: Never  . Sexual activity: Not Currently  Lifestyle  . Physical activity:    Days per week: Not on file    Minutes per session: Not on file  . Stress: Not on file  Relationships  . Social connections:    Talks on phone: Not on file    Gets together: Not on file    Attends religious service: Not on file    Active member of club or organization: Not on  file    Attends meetings of clubs or organizations: Not on file    Relationship status: Not on file  Other Topics Concern  . Not on file  Social History Narrative  . Not on file   Additional Social History:                         Sleep: Good  Appetite:  Good  Current Medications: Current Facility-Administered Medications  Medication Dose Route Frequency Provider Last Rate Last Dose  . acetaminophen (TYLENOL) tablet 650 mg  650 mg Oral Q6H PRN Clapacs, John T, MD      . alum & mag hydroxide-simeth (MAALOX/MYLANTA) 200-200-20 MG/5ML suspension 30 mL  30 mL Oral Q4H PRN Clapacs, John T, MD      . benztropine (COGENTIN) tablet 1 mg  1 mg Oral Q6H PRN Lenward Chancellor, MD       Or  . benztropine mesylate (COGENTIN) injection 1 mg  1 mg Intramuscular Q6H PRN Lenward Chancellor, MD      . carbamazepine (TEGRETOL) tablet 200 mg  200 mg Oral BID Janiyah Beery R, MD   200 mg at 12/09/17 2117  . haloperidol (HALDOL) tablet 5 mg  5 mg Oral Q6H PRN Lenward Chancellor, MD   5 mg at 11/22/17 1646   Or  . haloperidol lactate (HALDOL) injection 5 mg  5 mg Intramuscular Q6H PRN Lenward Chancellor, MD      . LORazepam (ATIVAN) injection 1 mg  1 mg Intramuscular Q6H PRN He, Jun, MD   1 mg at 11/22/17 0208  . LORazepam (ATIVAN) tablet 1 mg  1 mg Oral Q6H PRN Lenward Chancellor, MD   1 mg at 11/22/17 1646   Or  . LORazepam (ATIVAN) injection 1 mg  1 mg Intramuscular Q6H PRN Lenward Chancellor, MD      . magnesium hydroxide (MILK OF MAGNESIA) suspension 30 mL  30 mL Oral Daily PRN Clapacs, John T, MD      . QUEtiapine (SEROQUEL) tablet 300 mg  300 mg Oral QHS Sharhonda Atwood, Tyson Babinski, MD   300 mg at 12/09/17 2117  . thiamine (VITAMIN B-1) tablet 100 mg  100 mg Oral Daily Lenward Chancellor, MD   100 mg at 12/09/17 1308  . traZODone (DESYREL) tablet 100 mg  100 mg Oral QHS PRN He, Jun, MD   100 mg at 12/01/17 2142    Lab Results: No results found for this or any previous visit (from the past 48  hour(s)).  Blood Alcohol level:  Lab Results  Component Value Date   ETH <10 81/27/5170    Metabolic Disorder Labs: No results found for: HGBA1C, MPG No results found for: PROLACTIN No results found for: CHOL, TRIG, HDL, CHOLHDL, VLDL, LDLCALC  Physical Findings: AIMS: Facial and Oral Movements Muscles  of Facial Expression: None, normal Lips and Perioral Area: None, normal Jaw: None, normal Tongue: None, normal,Extremity Movements Upper (arms, wrists, hands, fingers): None, normal Lower (legs, knees, ankles, toes): None, normal, Trunk Movements Neck, shoulders, hips: None, normal, Overall Severity Severity of abnormal movements (highest score from questions above): None, normal Incapacitation due to abnormal movements: None, normal Patient's awareness of abnormal movements (rate only patient's report): No Awareness, Dental Status Current problems with teeth and/or dentures?: No Does patient usually wear dentures?: No  CIWA:    COWS:     Musculoskeletal: Strength & Muscle Tone: within normal limits Gait & Station: normal Patient leans: N/A  Psychiatric Specialty Exam: Physical Exam  Nursing note and vitals reviewed.   Review of Systems  All other systems reviewed and are negative.   Blood pressure 103/84, pulse 84, temperature 98.6 F (37 C), temperature source Oral, resp. rate 16, height '5\' 8"'  (1.727 m), weight 80.7 kg, SpO2 99 %.Body mass index is 27.06 kg/m.  General Appearance: Casual  Eye Contact:  Good  Speech:  Clear and Coherent  Volume:  Normal  Mood:  Euthymic  Affect:  Appropriate  Thought Process:  Coherent and Goal Directed  Orientation:  Full (Time, Place, and Person)  Thought Content:  Logical  Suicidal Thoughts:  No  Homicidal Thoughts:  No  Memory:  Immediate;   Poor  Judgement:  Impaired  Insight:  Lacking  Psychomotor Activity:  Normal  Concentration:  Concentration: Poor  Recall:  Poor  Fund of Knowledge:  Fair  Language:  Fair   Akathisia:  No      Assets:  Resilience  ADL's:  Intact  Cognition:  Impaired,  Moderate  Sleep:  Number of Hours: 7.75     Treatment Plan Summary: 47 yo female admitted due to behavioral issues at locked memory care unit. She has not had any behavioral disturbances in over a week and is being very patient with the placement process. There has been some disagreement with her care managers and her treatment team here about level of care. Her care managers through [facility] have referred to her to many facility and found that she does not meet criteria for most of them. They also agree that a less restrictive level of care is more appropriate such as a family care home which has some supervisor for her ADLs and memory. Her guardian continues to disagree with this plan so our hands are tied with making any referrals until her guardian consents. Her currently guardian is resigning in a few days so she will be assigned a new one soon. There needs to be a conference call with [place], her care managers and her  New guardian regarding referrals to care homes and group homes that we have worked with in the area.   Plan:  Mood -Continue Seroquel 300 mg qhs -Continue Tegretol 200 mg BID  Korsakoff's syndrome -Neurology was already consulted while in hospital in [facility] and workup completed including MRI, CT head, RPR. 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 She also has long history of heavy alcohol abuse and poor dietary intake. Her physical symptoms are improved significant since then and only persistent symptoms are short term memory loss ----MOCA on 11/21/17 was 21/30 -PT evaluated and felt she was now independent with cares. -OT recommends supervised living due to poor short term memory issues and help with tracking day of the week and day to day management skills. This could  be accomplished in an assisted living or groups home living  situation  Dispo -Placement has been an issue due to guardian not agreeing to referrals to group home or family care home. Her guardian will be changing on 10/18 so will need to have conference cll with new guardian and care managers Marylin Crosby, MD 12/10/2017, 2:28 PM

## 2017-12-10 NOTE — BHH Group Notes (Signed)
  Emotional Regulation December 10, 2017  1PM  Type of Therapy/Topic:  Group Therapy:  Emotion Regulation  Participation Level:  Active   Description of Group:   The purpose of this group is to assist patients in learning to regulate negative emotions and experience positive emotions. Patients will be guided to discuss ways in which they have been vulnerable to their negative emotions. These vulnerabilities will be juxtaposed with experiences of positive emotions or situations, and patients will be challenged to use positive emotions to combat negative ones. Special emphasis will be placed on coping with negative emotions in conflict situations, and patients will process healthy conflict resolution skills.  Therapeutic Goals: 1. Patient will identify two positive emotions or experiences to reflect on in order to balance out negative emotions 2. Patient will label two or more emotions that they find the most difficult to experience 3. Patient will demonstrate positive conflict resolution skills through discussion and/or role plays  Summary of Patient Progress:  This patient seems to be more alert and oriented to content of group. She was able to participate in group and share her feelings ( she still pulls back) could be that she has a shy nature or quieter than most. She was patient with her peers and did not  fall asleep.     Therapeutic Modalities:   Cognitive Behavioral Therapy Feelings Identification Dialectical Behavioral Therapy  [place] Kalla Watson LCSW [phone]

## 2017-12-10 NOTE — Progress Notes (Signed)
D: Pt denies SI/HI/AVH, verbally able to contract for safety. Pt is pleasant and cooperative. Pt. has no Complaints necessarily, but does report she is tired of being asked the same questions every night.  Patient Interactions with staff and peers appropriate. Pt. Frequently in the milieu watching tv. Pt. Attends snacks, but overall reportedly ate poor today. Pt. Smiles periodically during assessments. Pt. Affect brighter engaging in regular conversation versus assessment questioning.   A: Q x 15 minute observation checks were completed for safety. Patient was provided with education, but needs reinforcement.  Patient was given/offered medications per orders. Patient  was encourage to attend groups, participate in unit activities and continue with plan of care. Pt. Chart and plans of care reviewed. Pt. Given support and encouragement.   R: Patient is complaint with medication and unit procedures  With direction and encouragement from staff.             Precautionary checks every 15 minutes for safety maintained, room free of safety hazards, patient sustains no injury or falls during this shift. Will endorse care to next shift.

## 2017-12-10 NOTE — Progress Notes (Signed)
Patient pleasant today, attending a group, patient took medications late 17:21. No safety issues observed.

## 2017-12-10 NOTE — Tx Team (Signed)
Interdisciplinary Treatment and Diagnostic Plan Update  12/10/2017 Time of Session: 11am Joanne Johnston MRN: [id number]  Principal Diagnosis: Unspecified mood (affective) disorder (HCC)  Secondary Diagnoses: Principal Problem:   Unspecified mood (affective) disorder (HCC) Active Problems:   Alcohol-induced persisting dementia (HCC)   Korsakoff syndrome (HCC)   Current Medications:  Current Facility-Administered Medications  Medication Dose Route Frequency Provider Last Rate Last Dose  . acetaminophen (TYLENOL) tablet 650 mg  650 mg Oral Q6H PRN Clapacs, John T, MD      . alum & mag hydroxide-simeth (MAALOX/MYLANTA) 200-200-20 MG/5ML suspension 30 mL  30 mL Oral Q4H PRN Clapacs, John T, MD      . benztropine (COGENTIN) tablet 1 mg  1 mg Oral Q6H PRN Beverly Sessions, MD       Or  . benztropine mesylate (COGENTIN) injection 1 mg  1 mg Intramuscular Q6H PRN Beverly Sessions, MD      . carbamazepine (TEGRETOL) tablet 200 mg  200 mg Oral BID McNew, Holly R, MD   200 mg at 12/09/17 2117  . haloperidol (HALDOL) tablet 5 mg  5 mg Oral Q6H PRN Beverly Sessions, MD   5 mg at 11/22/17 1646   Or  . haloperidol lactate (HALDOL) injection 5 mg  5 mg Intramuscular Q6H PRN Beverly Sessions, MD      . LORazepam (ATIVAN) injection 1 mg  1 mg Intramuscular Q6H PRN He, Jun, MD   1 mg at 11/22/17 0208  . LORazepam (ATIVAN) tablet 1 mg  1 mg Oral Q6H PRN Beverly Sessions, MD   1 mg at 11/22/17 1646   Or  . LORazepam (ATIVAN) injection 1 mg  1 mg Intramuscular Q6H PRN Beverly Sessions, MD      . magnesium hydroxide (MILK OF MAGNESIA) suspension 30 mL  30 mL Oral Daily PRN Clapacs, John T, MD      . QUEtiapine (SEROQUEL) tablet 300 mg  300 mg Oral QHS McNew, Ileene Hutchinson, MD   300 mg at 12/09/17 2117  . thiamine (VITAMIN B-1) tablet 100 mg  100 mg Oral Daily Beverly Sessions, MD   100 mg at 12/09/17 1308  . traZODone (DESYREL) tablet 100 mg  100 mg Oral QHS PRN He, Jun, MD   100 mg at 12/01/17 2142    PTA Medications: Medications Prior to Admission  Medication Sig Dispense Refill Last Dose  . [EXPIRED] Multiple Vitamin (MULTI-VITAMINS) TABS Take 1 tablet by mouth daily.    unknown at unknown  . [EXPIRED] QUEtiapine (SEROQUEL) 25 MG tablet Take 25 mg by mouth daily.    unknown at unknown  . [EXPIRED] QUEtiapine (SEROQUEL) 50 MG tablet Take 50 mg by mouth at bedtime.    unknown at unknown  . topiramate (TOPAMAX) 50 MG tablet Take 50 mg by mouth daily.    unknwon at unknown    Patient Stressors: Other: People, buildings, rooms  Patient Strengths: Capable of independent living Physical Health Supportive family/friends  Treatment Modalities: Medication Management, Group therapy, Case management,  1 to 1 session with clinician, Psychoeducation, Recreational therapy.   Physician Treatment Plan for Primary Diagnosis: Unspecified mood (affective) disorder (HCC) Long Term Goal(s): Improvement in symptoms so as ready for discharge Improvement in symptoms so as ready for discharge   Short Term Goals: Ability to identify changes in lifestyle to reduce recurrence of condition will improve Ability to identify changes in lifestyle to reduce recurrence of condition will improve  Medication Management: Evaluate patient's response, side effects, and tolerance of medication regimen.  Therapeutic Interventions: 1 to 1 sessions, Unit Group sessions and Medication administration.  Evaluation of Outcomes: Progressing  Physician Treatment Plan for Secondary Diagnosis: Principal Problem:   Unspecified mood (affective) disorder (HCC) Active Problems:   Alcohol-induced persisting dementia (HCC)   Korsakoff syndrome (HCC)  Long Term Goal(s): Improvement in symptoms so as ready for discharge Improvement in symptoms so as ready for discharge   Short Term Goals: Ability to identify changes in lifestyle to reduce recurrence of condition will improve Ability to identify changes in lifestyle to reduce  recurrence of condition will improve     Medication Management: Evaluate patient's response, side effects, and tolerance of medication regimen.  Therapeutic Interventions: 1 to 1 sessions, Unit Group sessions and Medication administration.  Evaluation of Outcomes: Progressing   RN Treatment Plan for Primary Diagnosis: Unspecified mood (affective) disorder (HCC) Long Term Goal(s): Knowledge of disease and therapeutic regimen to maintain health will improve  Short Term Goals: Ability to identify and develop effective coping behaviors will improve and Compliance with prescribed medications will improve  Medication Management: RN will administer medications as ordered by provider, will assess and evaluate patient's response and provide education to patient for prescribed medication. RN will report any adverse and/or side effects to prescribing provider.  Therapeutic Interventions: 1 on 1 counseling sessions, Psychoeducation, Medication administration, Evaluate responses to treatment, Monitor vital signs and CBGs as ordered, Perform/monitor CIWA, COWS, AIMS and Fall Risk screenings as ordered, Perform wound care treatments as ordered.  Evaluation of Outcomes: Progressing   LCSW Treatment Plan for Primary Diagnosis: Unspecified mood (affective) disorder (HCC) Long Term Goal(s): Safe transition to appropriate next level of care at discharge, Engage patient in therapeutic group addressing interpersonal concerns.  Short Term Goals: Engage patient in aftercare planning with referrals and resources, Identify triggers associated with mental health/substance abuse issues and Increase skills for wellness and recovery  Therapeutic Interventions: Assess for all discharge needs, 1 to 1 time with Social worker, Explore available resources and support systems, Assess for adequacy in community support network, Educate family and significant other(s) on suicide prevention, Complete Psychosocial Assessment,  Interpersonal group therapy.  Evaluation of Outcomes: Progressing   Progress in Treatment: Attending groups: Yes. Participating in groups: Yes. and As evidenced by:  Pt has only been participating in the recreational groups. Taking medication as prescribed: Yes. Toleration medication: Yes. Family/Significant other contact made: Yes, individual(s) contacted:  CSW has made contact with pt's guardian, Esmeralda Arthur with [employer]. Patient understands diagnosis: No. Discussing patient identified problems/goals with staff: Yes. Medical problems stabilized or resolved: Yes. Denies suicidal/homicidal ideation: Yes. Issues/concerns per patient self-inventory: No. Other:    New problem(s) identified: No, Describe:     New Short Term/Long Term Goal(s):  Patient Goals:  To be discharged  Discharge Plan or Barriers: TBD.  CSW, Pt.s guardian and Koren Shiver are continuing to explore appropriate residence options.  Pt may be more appropriate to go to an [facility] with CST.   Reason for Continuation of Hospitalization: Anxiety Medication stabilization Other; describe Need to identify and secure appropriate residential placement for discharge.  Estimated Length of Stay: 7 days  Recreational Therapy: Patient Stressors: N/A Patient Goal: Patient will engage in interactions with peers and staff in pro-social manner at least 2x within 5 recreation therapy group sessions  Attendees: Patient: 12/10/2017 11:24 AM  Physician: Corinna Gab, MD 12/10/2017 11:24 AM  Nursing: Milas Hock, RN 12/10/2017 11:24 AM  RN Care Manager: 12/10/2017 11:24 AM  Social Worker: Joneen Roach, LCSWA  12/10/2017 11:24 AM  Recreational Therapist: Psychologist, educational. Dreama Saa, LRT 12/10/2017 11:24 AM  Other: Lowella Dandy, LCSW 12/10/2017 11:24 AM  Other:  12/10/2017 11:24 AM  Other: 12/10/2017 11:24 AM    Scribe for Treatment Team: Johny Shears, LCSW 12/10/2017 11:24 AM

## 2017-12-10 NOTE — [facility] Group Notes (Signed)
[facility] Group Notes:  (Nursing/MHT/Case Management/Adjunct)  Date:  12/10/2017  Time:  2:54 PM  Type of Therapy:  Psychoeducational Skills  Participation Level:  Active  Participation Quality:  Appropriate, Attentive and Sharing  Affect:  Appropriate  Cognitive:  Alert and Appropriate  Insight:  Appropriate  Engagement in Group:  Engaged  Modes of Intervention:  Discussion, Education and Support  Summary of Progress/Problems:  Kaidence Callaway Travis Genie Wenke 12/10/2017, 2:54 PM 

## 2017-12-10 NOTE — Progress Notes (Signed)
Recreation Therapy Notes  Date: 12/10/2017  Time: 9:30 am   Location: Craft room   Behavioral response: N/A   Intervention Topic: Goals  Discussion/Intervention: Patient did not attend group.   Clinical Observations/Feedback:  Patient did not attend group.   [name] LRT/CTRS        Joanne Johnston 12/10/2017 11:29 AM 

## 2017-12-11 NOTE — Progress Notes (Signed)
[facility] MD Progress Note  12/11/2017 1:45 PM Joanne Johnston  MRN:  [id number]   Subjective:  Pt is calm and cooperative with this provider. She is pleasant and in a good mood. Does not endorse SI, HI, AH, VH. She is taking medications. Has not had any outbursts.   Reviewed e-mail correspondence with her case manger with [facility]. It appears there has been some consultation done with a physical through them. They recommended a neurology consult for workup of other etiologies such as neurosyphilis, NMDA. Neurology was consulted during her previous extended hospitalization. Given her long history of alcohol use (2 bottles of wine or 1/5 of vodka a day) and very poor oral intake as well as  presentation of unsteady gait, amnesia and ataxia, she was diagnosed with Wernicke-Korsakoff. MRI brain and CT done were done at that time and unremarkable. RPR done as well which was negative so this rules out neurosyphilis. She has no symptoms at all suggestive of anti-NMDA that warrant work up for this (including afebrile, vitals have been stable, no psychosis at all). It was also noted that there was agreement that a secure nursing facility is not the ideal setting for her and would be more successful in group home/ALF setting with community supports as restrictive setting contributed to some of the behavioral issues.  [facility] and is not appropriate for state [facility] hospital.   Principal Problem: Unspecified mood (affective) disorder ([place]) Diagnosis:   Patient Active Problem List   Diagnosis Date Noted  . Unspecified mood (affective) disorder ([place]) [F39] 11/18/2017    Priority: High  . Alcohol-induced persisting dementia ([place]) [F10.27] 11/04/2017    Priority: High  . Korsakoff syndrome ([place]) [I71] 12/04/2017  . Agitation [R45.1] 11/04/2017   Total Time spent with patient: 15 minutes  Past Psychiatric History: See H&P  Past Medical History:  Past  Medical History:  Diagnosis Date  . Alcohol dependence ([place])   . Anxiety   . Dementia   . Depression   . Hypertension   . Tachycardia     Past Surgical History:  Procedure Laterality Date  . CESAREAN SECTION     x2   Family History: History reviewed. No pertinent family history. Family Psychiatric  History: See h&P Social History:  Social History   Substance and Sexual Activity  Alcohol Use Not Currently  . Frequency: Never   Comment: states hasn't had a drink in months     Social History   Substance and Sexual Activity  Drug Use Never    Social History   Socioeconomic History  . Marital status: Divorced    Spouse name: Not on file  . Number of children: Not on file  . Years of education: Not on file  . Highest education level: Not on file  Occupational History  . Not on file  Social Needs  . Financial resource strain: Not on file  . Food insecurity:    Worry: Not on file    Inability: Not on file  . Transportation needs:    Medical: Not on file    Non-medical: Not on file  Tobacco Use  . Smoking status: Never Smoker  . Smokeless tobacco: Never Used  Substance and Sexual Activity  . Alcohol use: Not Currently    Frequency: Never    Comment: states hasn't had a drink in months  . Drug use: Never  . Sexual activity: Not Currently  Lifestyle  . Physical activity:  Days per week: Not on file    Minutes per session: Not on file  . Stress: Not on file  Relationships  . Social connections:    Talks on phone: Not on file    Gets together: Not on file    Attends religious service: Not on file    Active member of club or organization: Not on file    Attends meetings of clubs or organizations: Not on file    Relationship status: Not on file  Other Topics Concern  . Not on file  Social History Narrative  . Not on file   Additional Social History:                         Sleep: Good  Appetite:  Good  Current Medications: Current  Facility-Administered Medications  Medication Dose Route Frequency Provider Last Rate Last Dose  . acetaminophen (TYLENOL) tablet 650 mg  650 mg Oral Q6H PRN Clapacs, John T, MD      . alum & mag hydroxide-simeth (MAALOX/MYLANTA) 200-200-20 MG/5ML suspension 30 mL  30 mL Oral Q4H PRN Clapacs, John T, MD      . benztropine (COGENTIN) tablet 1 mg  1 mg Oral Q6H PRN Lenward Chancellor, MD       Or  . benztropine mesylate (COGENTIN) injection 1 mg  1 mg Intramuscular Q6H PRN Lenward Chancellor, MD      . carbamazepine (TEGRETOL) tablet 200 mg  200 mg Oral BID Joshue Badal R, MD   200 mg at 12/10/17 2000  . haloperidol (HALDOL) tablet 5 mg  5 mg Oral Q6H PRN Lenward Chancellor, MD   5 mg at 11/22/17 1646   Or  . haloperidol lactate (HALDOL) injection 5 mg  5 mg Intramuscular Q6H PRN Lenward Chancellor, MD      . LORazepam (ATIVAN) injection 1 mg  1 mg Intramuscular Q6H PRN He, Jun, MD   1 mg at 11/22/17 0208  . LORazepam (ATIVAN) tablet 1 mg  1 mg Oral Q6H PRN Lenward Chancellor, MD   1 mg at 11/22/17 1646   Or  . LORazepam (ATIVAN) injection 1 mg  1 mg Intramuscular Q6H PRN Lenward Chancellor, MD      . magnesium hydroxide (MILK OF MAGNESIA) suspension 30 mL  30 mL Oral Daily PRN Clapacs, John T, MD      . QUEtiapine (SEROQUEL) tablet 300 mg  300 mg Oral QHS Kanai Berrios, Tyson Babinski, MD   300 mg at 12/10/17 2137  . thiamine (VITAMIN B-1) tablet 100 mg  100 mg Oral Daily Lenward Chancellor, MD   100 mg at 12/10/17 1722  . traZODone (DESYREL) tablet 100 mg  100 mg Oral QHS PRN He, Jun, MD   100 mg at 12/01/17 2142    Lab Results: No results found for this or any previous visit (from the past 48 hour(s)).  Blood Alcohol level:  Lab Results  Component Value Date   ETH <10 66/44/0347    Metabolic Disorder Labs: No results found for: HGBA1C, MPG No results found for: PROLACTIN No results found for: CHOL, TRIG, HDL, CHOLHDL, VLDL, LDLCALC  Physical Findings: AIMS: Facial and Oral Movements Muscles of Facial  Expression: None, normal Lips and Perioral Area: None, normal Jaw: None, normal Tongue: None, normal,Extremity Movements Upper (arms, wrists, hands, fingers): None, normal Lower (legs, knees, ankles, toes): None, normal, Trunk Movements Neck, shoulders, hips: None, normal, Overall Severity Severity of abnormal movements (highest score from questions above):  None, normal Incapacitation due to abnormal movements: None, normal Patient's awareness of abnormal movements (rate only patient's report): No Awareness, Dental Status Current problems with teeth and/or dentures?: No Does patient usually wear dentures?: No  CIWA:    COWS:     Musculoskeletal: Strength & Muscle Tone: within normal limits Gait & Station: normal Patient leans: N/A  Psychiatric Specialty Exam: Physical Exam  Nursing note and vitals reviewed.   Review of Systems  All other systems reviewed and are negative.   Blood pressure 118/84, pulse 82, temperature 98 F (36.7 C), temperature source Oral, resp. rate 18, height '5\' 8"'  (1.727 m), weight 80.7 kg, SpO2 100 %.Body mass index is 27.06 kg/m.  General Appearance: Disheveled  Eye Contact:  Minimal  Speech:  Clear and Coherent  Volume:  Normal  Mood:  Euthymic  Affect:  Appropriate  Thought Process:  Coherent and Goal Directed  Orientation:  Full (Time, Place, and Person)  Thought Content:  Logical  Suicidal Thoughts:  No  Homicidal Thoughts:  No  Memory:  Immediate;   Poor  Judgement:  Impaired  Insight:  Lacking  Psychomotor Activity:  Normal  Concentration:  Attention Span: Poor  Recall:  Poor  Fund of Knowledge:  Fair  Language:  Fair  Akathisia:  No      Assets:  Resilience  ADL's:  Intact  Cognition:  Impaired,  Moderate  Sleep:  Number of Hours: 6     Treatment Plan Summary: 47 yo female admitted due to behavioral issues due to being in locked memory care unit. She has not had any behavioral disturbances in over a week and is being very  patient with the placement process. There has been some disagreement with her guardian and her treatment team and care managers (we and her care managers agree with [facility] level of care) here about level of care. Her care managers through [facility] have referred to her to many facility and found that she does not meet criteria for most of them and are really out of options at this point. They also agree that a less restrictive level of care is more appropriate such as a family care home which has some supervisor for her ADLs and memory. Her guardian continues to disagree with this plan so our hands are tied with making any referrals until her guardian consents. Her currently guardian is resigning in a few days so she will be assigned a new one soon. There needs to be a conference call with [place], her care managers and her  New guardian regarding referrals to care homes and group homes that we have worked with in the area. I have left message with her current guardian today to touch base and have not heard back yet.   Mood -Continue Seroquel 300 mg qhs -Continue Tegretol 200 mg BID  Korsakoff's syndrome -Neurology was already consulted while in hospital in [facility] and workup completed including MRI, CT head, RPR. 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 She also has long history of heavy alcohol abuse and poor dietary intake. Her physical symptoms are improved significant since then and only persistent symptoms are short term memory loss ----MOCA on 11/21/17 was 21/30 -PT evaluated and felt she was now independent with cares. -OT recommends supervised living due to poor short term memory issues and help with tracking day of the week and day to day management skills. This could be accomplished in an assisted living or groups  home living situation  Dispo -Placement has been an issue due to guardian not agreeing to referrals to group home or  family care home. Her guardian will be changing on 10/18 so will need to have conference cll with new guardian and care managers. I left message with her current guardian, Joanne Johnston today and have not heard back yet.   Marylin Crosby, MD 12/11/2017, 1:45 PM

## 2017-12-11 NOTE — Plan of Care (Addendum)
Patient was in the dayroom upon arrival on the unit watching TV with other patients. Patient was pleasant and compliant with medications. Patient made good eye contact the writer during assessment and while getting medications.    Problem: Activity: Goal: Will identify at least one activity in which they can participate Outcome: Progressing  Goal: Ability to interact with others will improve Outcome: Progressing Goal: Ability to use eye contact when communicating with others will improve Outcome: Progressing

## 2017-12-11 NOTE — Progress Notes (Signed)
Recreation Therapy Notes   Date: 12/11/2017  Time: 9:30 am   Location: Craft room   Behavioral response: N/A   Intervention Topic: Problem Solving  Discussion/Intervention: Patient did not attend group.   Clinical Observations/Feedback:  Patient did not attend group.   Allie Gerhold LRT/CTRS         Joanne Johnston 12/11/2017 10:34 AM 

## 2017-12-11 NOTE — Plan of Care (Signed)
Patient is alert and oriented to self and unit. Patient has been pleasant with no outburst today. Taking medications appropriately, and attending groups with participation. Patient denies SI, HI and AVH. Patient has no self injurious behaviors, interacts well with peers, clothing and dress is appropriate. Nurse will continue to monitor. 15 minute safety checks to continue. Problem: Activity: Goal: Will identify at least one activity in which they can participate Outcome: Progressing   Problem: Coping: Goal: Ability to identify and develop effective coping behavior will improve Outcome: Progressing Goal: Ability to interact with others will improve Outcome: Progressing Goal: Demonstration of participation in decision-making regarding own care will improve Outcome: Progressing Goal: Ability to use eye contact when communicating with others will improve Outcome: Progressing   Problem: Health Behavior/Discharge Planning: Goal: Identification of resources available to assist in meeting health care needs will improve Outcome: Progressing

## 2017-12-11 NOTE — [facility] Group Notes (Signed)
[facility] LCSW Group Therapy LCSW Group Therapy Note  12/11/2017 1:00 pm  Type of Therapy/Topic:  Group Therapy:  Balance in Life  Participation Level:  Minimal  Description of Group:    This group will address the concept of balance and how it feels and looks when one is unbalanced. Patients will be encouraged to process areas in their lives that are out of balance and identify reasons for remaining unbalanced. Facilitators will guide patients in utilizing problem-solving interventions to address and correct the stressor making their life unbalanced. Understanding and applying boundaries will be explored and addressed for obtaining and maintaining a balanced life. Patients will be encouraged to explore ways to assertively make their unbalanced needs known to significant others in their lives, using other group members and facilitator for support and feedback.  Therapeutic Goals: 1. Patient will identify two or more emotions or situations they have that consume much of in their lives. 2. Patient will identify signs/triggers that life has become out of balance:  3. Patient will identify two ways to set boundaries in order to achieve balance in their lives:  4. Patient will demonstrate ability to communicate their needs through discussion and/or role plays  Summary of Patient Progress: Arrow presented to group late. She was able to engage some in the group activity entitled, "Recognizing Stress". Makeyla shared that she only wanted to shared her emotional stress triggers which are increased irritability and anger.  She did not wish to identify physical or behavioral triggers nor did she share 2-3 things she can do or does to help her reduce her stress symptoms.     Therapeutic Modalities:   Cognitive Behavioral Therapy Solution-Focused Therapy Assertiveness Training  Alease Frame, [place] 12/11/2017 3:26 PM

## 2017-12-11 NOTE — Plan of Care (Signed)
Patient is calm today and took all her medicines with out any issues, appear to be responding well to treatment regimen, seeing with peers doing coloring together, mood is bright no episodes of agitations or an out burst, contract for safety and 15 minute rounding is maintained , sleep long hours and appetite is good, no distress.    Problem: Activity: Goal: Will identify at least one activity in which they can participate Outcome: Progressing   Problem: Coping: Goal: Ability to identify and develop effective coping behavior will improve Outcome: Progressing Goal: Ability to interact with others will improve Outcome: Progressing Goal: Demonstration of participation in decision-making regarding own care will improve Outcome: Progressing Goal: Ability to use eye contact when communicating with others will improve Outcome: Progressing   Problem: Health Behavior/Discharge Planning: Goal: Identification of resources available to assist in meeting health care needs will improve Outcome: Progressing   Problem: Self-Concept: Goal: Will verbalize positive feelings about self Outcome: Progressing   Problem: Safety: Goal: Ability to remain free from injury will improve Outcome: Progressing

## 2017-12-12 NOTE — [facility] Group Notes (Signed)
[facility] Group Notes:  (Nursing/MHT/Case Management/Adjunct)  Date:  12/12/2017  Time:  11:13 PM  Type of Therapy:  Group Therapy  Participation Level:  Active  Participation Quality:  Appropriate  Affect:  Appropriate  Cognitive:  Appropriate  Insight:  Appropriate  Engagement in Group:  Engaged  Modes of Intervention:  Support  Summary of Progress/Problems:  Joanne Johnston 12/12/2017, 11:13 PM

## 2017-12-12 NOTE — Progress Notes (Signed)
Recreation Therapy Notes  Date: 12/12/2017  Time: 9:30 am   Location: Craft room   Behavioral response: N/A   Intervention Topic: Leisure   Discussion/Intervention: Patient did not attend group.   Clinical Observations/Feedback:  Patient did not attend group.   Mekisha Bittel LRT/CTRS        Shavell Nored 12/12/2017 10:59 AM 

## 2017-12-12 NOTE — [facility] Group Notes (Signed)
Feelings Around Relapse 12/12/2017 1PM  Type of Therapy and Topic:  Group Therapy:  Feelings around Relapse and Recovery  Participation Level:  Did Not Attend   Description of Group:    Patients in this group will discuss emotions they experience before and after a relapse. They will process how experiencing these feelings, or avoidance of experiencing them, relates to having a relapse. Facilitator will guide patients to explore emotions they have related to recovery. Patients will be encouraged to process which emotions are more powerful. They will be guided to discuss the emotional reaction significant others in their lives may have to patients' relapse or recovery. Patients will be assisted in exploring ways to respond to the emotions of others without this contributing to a relapse.  Therapeutic Goals: 1. Patient will identify two or more emotions that lead to a relapse for them 2. Patient will identify two emotions that result when they relapse 3. Patient will identify two emotions related to recovery 4. Patient will demonstrate ability to communicate their needs through discussion and/or role plays   Summary of Patient Progress: Patient was encouraged and invited to attend group. Patient did not attend group. Social worker will continue to encourage group participation in the future.      Therapeutic Modalities:   Cognitive Behavioral Therapy Solution-Focused Therapy Assertiveness Training Relapse Prevention Therapy   Suzan Slick, LCSW 12/12/2017 3:09 PM

## 2017-12-12 NOTE — Progress Notes (Signed)
[facility] MD Progress Note  12/12/2017 12:49 PM Joanne Johnston  MRN:  [id number] Subjective:  Pt has been very calm and cooperative on the unit. Has not had any verbal outbursts at all. Sh is interacting well with others. She is dressing and caring for her ADLs appropriately. She is taking medications well.  Her father called me today. His name is Marden Noble and can be reached at [phone]. He states that he has been calling her daily and she sounds really good on the phone. HE states that she still has some memory impairment and tends to repeat herself a lot but overall she has been very calm. He was wondering if she has a diagnosis of bipolar disorder or schizophrenia. Discussed that in my professional opinion, she does not. 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 She has not had any other symptosm consistent with schizophrenia or bipolar disorder. I discussed with him about her team's recommendation of a less restrictive level of care such as a group home. He is in complete agreement with this. He states that the [facility] was a very inappropriate level of care for her. He expressed his frustrations with her guardian, Johny Sax stating that she has not returned any of their calls and seems like "she doesn't care." Discussed with him that her case is being moved to a new guardian soon. He states that him and his wife are unable to have her stay with them currently as they have medical issues but in the long term would maybe like her moved to [place] to be closer to them. He states that this can be a long term goal but right now looking for a facility in [place] is appropriate. We discussed her history of alcohol use and he states, "I think she was drinking constantly." he states that her ex-husband said she would come home from work and get a bottle of wine and go straight to her room and drink "until she passed out."  He states that pt recently asked him to get in touch with an ex-husband of hers, Merry Proud, in which he did. Merry Proud has been calling her daily and checking in. He does not think Merry Proud would be able to give her the level of support at this time. Answered any questions Marden Noble had and he was greatly appreciative of the conversation.   Principal Problem: Unspecified mood (affective) disorder ([place]) Diagnosis:   Patient Active Problem List   Diagnosis Date Noted  . Unspecified mood (affective) disorder ([place]) [F39] 11/18/2017    Priority: High  . 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 Agitation [R45.1] 11/04/2017   Total Time spent with patient: 30 minutes  Past Psychiatric History: See H&P  Past Medical History:  Past Medical History:  Diagnosis Date  . Alcohol dependence ([place])   . Anxiety   . Dementia   . Depression   . Hypertension   . Tachycardia     Past Surgical History:  Procedure Laterality Date  . CESAREAN SECTION     x2   Family History: History reviewed. No pertinent family history. Family Psychiatric  History: See H&P Social History:  Social History   Substance and Sexual Activity  Alcohol Use Not Currently  . Frequency: Never   Comment: states hasn't had a drink in months     Social History   Substance  and Sexual Activity  Drug Use Never    Social History   Socioeconomic History  . Marital status: Divorced    Spouse name: Not on file  . Number of children: Not on file  . Years of education: Not on file  . Highest education level: Not on file  Occupational History  . Not on file  Social Needs  . Financial resource strain: Not on file  . Food insecurity:    Worry: Not on file    Inability: Not on file  . Transportation needs:    Medical: Not on file    Non-medical: Not on file  Tobacco Use  . Smoking status: Never Smoker  . Smokeless tobacco: Never Used  Substance and  Sexual Activity  . Alcohol use: Not Currently    Frequency: Never    Comment: states hasn't had a drink in months  . Drug use: Never  . Sexual activity: Not Currently  Lifestyle  . Physical activity:    Days per week: Not on file    Minutes per session: Not on file  . Stress: Not on file  Relationships  . Social connections:    Talks on phone: Not on file    Gets together: Not on file    Attends religious service: Not on file    Active member of club or organization: Not on file    Attends meetings of clubs or organizations: Not on file    Relationship status: Not on file  Other Topics Concern  . Not on file  Social History Narrative  . Not on file   Additional Social History:                         Sleep: Good  Appetite:  Good  Current Medications: Current Facility-Administered Medications  Medication Dose Route Frequency Provider Last Rate Last Dose  . acetaminophen (TYLENOL) tablet 650 mg  650 mg Oral Q6H PRN Clapacs, John T, MD      . alum & mag hydroxide-simeth (MAALOX/MYLANTA) 200-200-20 MG/5ML suspension 30 mL  30 mL Oral Q4H PRN Clapacs, John T, MD      . benztropine (COGENTIN) tablet 1 mg  1 mg Oral Q6H PRN Lenward Chancellor, MD       Or  . benztropine mesylate (COGENTIN) injection 1 mg  1 mg Intramuscular Q6H PRN Lenward Chancellor, MD      . carbamazepine (TEGRETOL) tablet 200 mg  200 mg Oral BID Rmani Kellogg R, MD   200 mg at 12/11/17 2142  . haloperidol (HALDOL) tablet 5 mg  5 mg Oral Q6H PRN Lenward Chancellor, MD   5 mg at 11/22/17 1646   Or  . haloperidol lactate (HALDOL) injection 5 mg  5 mg Intramuscular Q6H PRN Lenward Chancellor, MD      . LORazepam (ATIVAN) injection 1 mg  1 mg Intramuscular Q6H PRN He, Jun, MD   1 mg at 11/22/17 0208  . LORazepam (ATIVAN) tablet 1 mg  1 mg Oral Q6H PRN Lenward Chancellor, MD   1 mg at 11/22/17 1646   Or  . LORazepam (ATIVAN) injection 1 mg  1 mg Intramuscular Q6H PRN Lenward Chancellor, MD      . magnesium  hydroxide (MILK OF MAGNESIA) suspension 30 mL  30 mL Oral Daily PRN Clapacs, John T, MD      . QUEtiapine (SEROQUEL) tablet 300 mg  300 mg Oral QHS Izea Livolsi, Tyson Babinski, MD   300 mg  at 12/11/17 2143  . thiamine (VITAMIN B-1) tablet 100 mg  100 mg Oral Daily Lenward Chancellor, MD   100 mg at 12/11/17 1432  . traZODone (DESYREL) tablet 100 mg  100 mg Oral QHS PRN He, Jun, MD   100 mg at 12/01/17 2142    Lab Results: No results found for this or any previous visit (from the past 48 hour(s)).  Blood Alcohol level:  Lab Results  Component Value Date   ETH <10 24/10/7351    Metabolic Disorder Labs: No results found for: HGBA1C, MPG No results found for: PROLACTIN No results found for: CHOL, TRIG, HDL, CHOLHDL, VLDL, LDLCALC  Physical Findings: AIMS: Facial and Oral Movements Muscles of Facial Expression: None, normal Lips and Perioral Area: None, normal Jaw: None, normal Tongue: None, normal,Extremity Movements Upper (arms, wrists, hands, fingers): None, normal Lower (legs, knees, ankles, toes): None, normal, Trunk Movements Neck, shoulders, hips: None, normal, Overall Severity Severity of abnormal movements (highest score from questions above): None, normal Incapacitation due to abnormal movements: None, normal Patient's awareness of abnormal movements (rate only patient's report): No Awareness, Dental Status Current problems with teeth and/or dentures?: No Does patient usually wear dentures?: No  CIWA:    COWS:     Musculoskeletal: Strength & Muscle Tone: within normal limits Gait & Station: normal Patient leans: N/A  Psychiatric Specialty Exam: Physical Exam  Nursing note and vitals reviewed.   Review of Systems  All other systems reviewed and are negative.   Blood pressure 122/82, pulse 80, temperature 98 F (36.7 C), temperature source Oral, resp. rate 18, height '5\' 8"'  (1.727 m), weight 80.7 kg, SpO2 100 %.Body mass index is 27.06 kg/m.  General Appearance: Casual  Eye  Contact:  Minimal  Speech:  Clear and Coherent  Volume:  Normal  Mood:  Euthymic  Affect:  Appropriate  Thought Process:  Coherent and Goal Directed  Orientation:  Full (Time, Place, and Person)  Thought Content:  Logical  Suicidal Thoughts:  No  Homicidal Thoughts:  No  Memory:  Immediate;   Poor  Judgement:  Impaired  Insight:  Lacking  Psychomotor Activity:  EPS  Concentration:  Concentration: Poor  Recall:  Poor  Fund of Knowledge:  Fair  Language:  Fair  Akathisia:  No      Assets:  Resilience  ADL's:  Intact  Cognition:  Impaired,  Moderate  Sleep:  Number of Hours: 7     Treatment Plan Summary: 47 yo female admitted due to behavioral issues at the memory care unit. I feel that level of care was too restrictive for her and caused her to act out. I continue to feel that a [facility] or group home setting would be much more appropriate to give her some independence but with supervision. She has been very calm and cooperative on the unit. She has not had any verbal outbursts in a few weeks. Her guardian has not returned my calls. I believe her guardian will be resigning today so will need to have conference with new guardian so we can start referrals to [facility] and group homes.   Mood -Mood -Continue Seroquel 300 mg qhs -Continue Tegretol 200 mg BID  Korsakoff's syndrome -Neurology was already consulted while in hospital in [facility] and workup completed including MRI, CT head, RPR. 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 She also has long history of heavy alcohol abuse and poor dietary intake. Her physical symptoms are improved significant  since then and only persistent symptoms are short term memory loss ----MOCA on 9/27/19was 21/30 -PT evaluated and felt she was now independent with cares. -OT recommends supervised living due to poor short term memory issues and help with tracking day of the week and day to day  management skills. This could be accomplished in an assisted living or groups home living situation  Dispo -Placement has been an issue due to guardian not agreeing to referrals to group home or family care home. Her guardian will be changing on 10/18 so will need to have conference cll with new guardian and care managers. I left message with her current guardian, [place] yesterday and have not heard back yet.    Marylin Crosby, MD 12/12/2017, 12:49 PM

## 2017-12-12 NOTE — Progress Notes (Signed)
D - Patient was in the day room upon arrival. Patient was interactive with others on the unit this evening and went to wrap up group. Patient denies SI, HI, AVH and verbally contracts for safety at this time. Patient had no behavioral outbursts tonight.   A - Patient was cooperative and complaint with medication and treatment plan. Patient was pleasant    R - Patient is monitored Q 15 minutes per unit protocol. Patient to inform staff if there are any problems.

## 2017-12-13 NOTE — Plan of Care (Signed)
Patient is seeing socializing with peers in the common area, mood is bright and responding well to treatment and compliant , no violent out burst behaviors , attends groups and assertive , sleep long hours with out any interruptions , thought is organized and no complain voiced , denies SI/HI and no signs of AVH noted. 15 minute safety rounding in progress no distress.   Problem: Activity: Goal: Will identify at least one activity in which they can participate Outcome: Progressing   Problem: Coping: Goal: Ability to identify and develop effective coping behavior will improve Outcome: Progressing Goal: Ability to interact with others will improve Outcome: Progressing Goal: Demonstration of participation in decision-making regarding own care will improve Outcome: Progressing Goal: Ability to use eye contact when communicating with others will improve Outcome: Progressing   Problem: Health Behavior/Discharge Planning: Goal: Identification of resources available to assist in meeting health care needs will improve Outcome: Progressing   Problem: Self-Concept: Goal: Will verbalize positive feelings about self Outcome: Progressing

## 2017-12-13 NOTE — [facility] Group Notes (Signed)
LCSW Group Therapy Note  12/13/2017 1:15pm  Type of Therapy and Topic:  Group Therapy:  Cognitive Distortions  Participation Level:  Active   Description of Group:    Patients in this group will be introduced to the topic of cognitive distortions.  Patients will identify and describe cognitive distortions, describe the feelings these distortions create for them.  Patients will identify one or more situations in their personal life where they have cognitively distorted thinking and will verbalize challenging this cognitive distortion through positive thinking skills.  Patients will practice the skill of using positive affirmations to challenge cognitive distortions using affirmation cards.    Therapeutic Goals:  1. Patient will identify two or more cognitive distortions they have used 2. Patient will identify one or more emotions that stem from use of a cognitive distortion 3. Patient will demonstrate use of a positive affirmation to counter a cognitive distortion through discussion and/or role play. 4. Patient will describe one way cognitive distortions can be detrimental to wellness   Summary of Patient Progress: The patient reported that  she feels"okay." Patients were introduced to the topic of cognitive distortions. The patient was able to identify and describe cognitive distortions, described the feelings these distortions create for her.  The patient shared her mos used unhelpful thinking style is" disqualifying the positive." Patient identified a situation in her personal life where she has cognitively distorted thinking and was able to verbalize and challenged this cognitive distortion through positive thinking skills. Patient was able to provide support and validation to other group members.      Therapeutic Modalities:   Cognitive Behavioral Therapy Motivational Interviewing   Wai Litt  [name], LCSW 12/13/2017 10:32 AM

## 2017-12-13 NOTE — Plan of Care (Signed)
Patient oriented to unit. Patient's safety is maintained on unit. Patient denies SI/HI/AVH.  Patient is minimal during assessment. Patient refuses medication this morning.    Problem: Coping: Goal: Ability to interact with others will improve Outcome: Not Progressing Goal: Demonstration of participation in decision-making regarding own care will improve Outcome: Not Progressing Goal: Ability to use eye contact when communicating with others will improve Outcome: Not Progressing   Problem: Self-Concept: Goal: Will verbalize positive feelings about self Outcome: Not Progressing

## 2017-12-13 NOTE — Progress Notes (Signed)
[facility] MD Progress Note  12/13/2017 4:57 PM Joanne Johnston  MRN:  [id number] Subjective:   Im doing ok" Pt reportedly having verbal aggression at times, but calmer than before, pt eating lunvh , calm, cooperative, denies AVH, denies SI/HI, taking meds, denies side effects. Waiting for placement.   Principal Problem: Unspecified mood (affective) disorder ([place]) Diagnosis:   Patient Active Problem List   Diagnosis Date Noted  . 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 Unspecified mood (affective) disorder ([place]) [F39] 11/18/2017  . 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 Agitation [R45.1] 11/04/2017   Total Time spent with patient: 30 minutes  Past Psychiatric History: See H&P  Past Medical History:  Past Medical History:  Diagnosis Date  . Alcohol dependence ([place])   . Anxiety   . Dementia   . Depression   . Hypertension   . Tachycardia     Past Surgical History:  Procedure Laterality Date  . CESAREAN SECTION     x2   Family History: History reviewed. No pertinent family history. Family Psychiatric  History: See H&P Social History:  Social History   Substance and Sexual Activity  Alcohol Use Not Currently  . Frequency: Never   Comment: states hasn't had a drink in months     Social History   Substance and Sexual Activity  Drug Use Never    Social History   Socioeconomic History  . Marital status: Divorced    Spouse name: Not on file  . Number of children: Not on file  . Years of education: Not on file  . Highest education level: Not on file  Occupational History  . Not on file  Social Needs  . Financial resource strain: Not on file  . Food insecurity:    Worry: Not on file    Inability: Not on file  . Transportation needs:    Medical: Not on file    Non-medical: Not on file  Tobacco Use  . Smoking status: Never Smoker  . Smokeless tobacco: Never Used  Substance and Sexual Activity  . Alcohol use: Not Currently     Frequency: Never    Comment: states hasn't had a drink in months  . Drug use: Never  . Sexual activity: Not Currently  Lifestyle  . Physical activity:    Days per week: Not on file    Minutes per session: Not on file  . Stress: Not on file  Relationships  . Social connections:    Talks on phone: Not on file    Gets together: Not on file    Attends religious service: Not on file    Active member of club or organization: Not on file    Attends meetings of clubs or organizations: Not on file    Relationship status: Not on file  Other Topics Concern  . Not on file  Social History Narrative  . Not on file   Additional Social History:                         Sleep: Good  Appetite:  Good  Current Medications: Current Facility-Administered Medications  Medication Dose Route Frequency Provider Last Rate Last Dose  . acetaminophen (TYLENOL) tablet 650 mg  650 mg Oral Q6H PRN Clapacs, John T, MD      . alum & mag hydroxide-simeth (MAALOX/MYLANTA) 200-200-20 MG/5ML suspension 30 mL  30 mL Oral Q4H PRN Clapacs, Madie Reno, MD      . benztropine (  COGENTIN) tablet 1 mg  1 mg Oral Q6H PRN Lenward Chancellor, MD       Or  . benztropine mesylate (COGENTIN) injection 1 mg  1 mg Intramuscular Q6H PRN Lenward Chancellor, MD      . carbamazepine (TEGRETOL) tablet 200 mg  200 mg Oral BID McNew, Holly R, MD   200 mg at 12/12/17 2000  . haloperidol (HALDOL) tablet 5 mg  5 mg Oral Q6H PRN Lenward Chancellor, MD   5 mg at 11/22/17 1646   Or  . haloperidol lactate (HALDOL) injection 5 mg  5 mg Intramuscular Q6H PRN Lenward Chancellor, MD      . LORazepam (ATIVAN) injection 1 mg  1 mg Intramuscular Q6H PRN He, Jun, MD   1 mg at 11/22/17 0208  . LORazepam (ATIVAN) tablet 1 mg  1 mg Oral Q6H PRN Lenward Chancellor, MD   1 mg at 11/22/17 1646   Or  . LORazepam (ATIVAN) injection 1 mg  1 mg Intramuscular Q6H PRN Lenward Chancellor, MD      . magnesium hydroxide (MILK OF MAGNESIA) suspension 30 mL  30 mL  Oral Daily PRN Clapacs, John T, MD      . QUEtiapine (SEROQUEL) tablet 300 mg  300 mg Oral QHS McNew, Tyson Babinski, MD   300 mg at 12/12/17 2116  . thiamine (VITAMIN B-1) tablet 100 mg  100 mg Oral Daily Lenward Chancellor, MD   100 mg at 12/12/17 1211  . traZODone (DESYREL) tablet 100 mg  100 mg Oral QHS PRN He, Jun, MD   100 mg at 12/01/17 2142    Lab Results: No results found for this or any previous visit (from the past 48 hour(s)).  Blood Alcohol level:  Lab Results  Component Value Date   ETH <10 14/48/1856    Metabolic Disorder Labs: No results found for: HGBA1C, MPG No results found for: PROLACTIN No results found for: CHOL, TRIG, HDL, CHOLHDL, VLDL, LDLCALC  Physical Findings: AIMS: Facial and Oral Movements Muscles of Facial Expression: None, normal Lips and Perioral Area: None, normal Jaw: None, normal Tongue: None, normal,Extremity Movements Upper (arms, wrists, hands, fingers): None, normal Lower (legs, knees, ankles, toes): None, normal, Trunk Movements Neck, shoulders, hips: None, normal, Overall Severity Severity of abnormal movements (highest score from questions above): None, normal Incapacitation due to abnormal movements: None, normal Patient's awareness of abnormal movements (rate only patient's report): No Awareness, Dental Status Current problems with teeth and/or dentures?: No Does patient usually wear dentures?: No  CIWA:    COWS:     Musculoskeletal: Strength & Muscle Tone: within normal limits Gait & Station: normal Patient leans: N/A  Psychiatric Specialty Exam: Physical Exam  Nursing note and vitals reviewed.   Review of Systems  All other systems reviewed and are negative.   Blood pressure 122/82, pulse 80, temperature 98 F (36.7 C), temperature source Oral, resp. rate 18, height '5\' 8"'  (1.727 m), weight 80.7 kg, SpO2 100 %.Body mass index is 27.06 kg/m.  General Appearance: Casual  Eye Contact:  Minimal  Speech:  Clear and Coherent   Volume:  Normal  Mood:  Euthymic  Affect:  Appropriate, calmer  Thought Process:  Coherent and Goal Directed  Orientation:  Full (Time, Place, and Person)  Thought Content:  Logical  Suicidal Thoughts:  No  Homicidal Thoughts:  No  Memory:  Immediate;   Poor  Judgement:  Impaired  Insight:  Lacking  Psychomotor Activity:  EPS  Concentration:  Concentration: Poor  Recall:  Poor  Fund of Knowledge:  Fair  Language:  Fair  Akathisia:  No      Assets:  Resilience  ADL's:  Intact  Cognition:  Impaired,  Moderate  Sleep:  Number of Hours: 6     Treatment Plan Summary: 47 yo female admitted due to behavioral issues at the memory care unit.  Pt calmer,  Waiting for placement, Mood -Mood -Continue Seroquel 300 mg qhs -Continue Tegretol 200 mg BID  Korsakoff's syndrome -Neurology was already consulted while in hospital in [facility] and workup completed including MRI, CT head, RPR. 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 She also has long history of heavy alcohol abuse and poor dietary intake. Her physical symptoms are improved significant since then and only persistent symptoms are short term memory loss ----MOCA on 9/27/19was 21/30 -PT evaluated and felt she was now independent with cares. -OT recommends supervised living due to poor short term memory issues and help with tracking day of the week and day to day management skills. This could be accomplished in an assisted living or groups home living situation  Dispo -Placement has been an issue due to guardian not agreeing to referrals to group home or family care home. Her guardian will be changing on 10/18.    Lenward Chancellor, MD 12/13/2017, 4:57 PMPatient ID: Joanne Johnston, female   DOB: 27-Sep-1970, 47 y.o.   MRN: [id number]

## 2017-12-14 NOTE — Plan of Care (Signed)
Patient oriented to unit. Patient's safety is maintained on unit. Patient denies SI/HI/AVH.  Patient is minimal during assessment. Patient is present in milieu and attends unit activities.   Problem: Coping: Goal: Ability to interact with others will improve Outcome: Progressing Goal: Ability to use eye contact when communicating with others will improve Outcome: Progressing   Problem: Self-Concept: Goal: Will verbalize positive feelings about self Outcome: Not Progressing

## 2017-12-14 NOTE — Plan of Care (Signed)
Patient was in the day room watching TV and talking to other patients from the unit. Patient had fair eye contact with this writer during assessment and when getting medications.   Problem: Coping: Goal: Ability to interact with others will improve Outcome: Progressing   Problem: Coping: Goal: Ability to use eye contact when communicating with others will improve Outcome: Progressing

## 2017-12-14 NOTE — Progress Notes (Signed)
[facility] MD Progress Note  12/14/2017 1:38 PM Georgina Krist  MRN:  [id number] Subjective:   What do you need?" Pt reportedly having verbal aggression at times, irritable, labile,  isolative in her room , minimally verbal,  uncooperative, denies AVH, denies SI/HI, taking meds, denies side effects. Waiting for placement.   Principal Problem: Unspecified mood (affective) disorder ([place]) Diagnosis:   Patient Active Problem List   Diagnosis Date Noted  . 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 Unspecified mood (affective) disorder ([place]) [F39] 11/18/2017  . 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 Agitation [R45.1] 11/04/2017   Total Time spent with patient: 30 minutes  Past Psychiatric History: See H&P  Past Medical History:  Past Medical History:  Diagnosis Date  . Alcohol dependence ([place])   . Anxiety   . Dementia   . Depression   . Hypertension   . Tachycardia     Past Surgical History:  Procedure Laterality Date  . CESAREAN SECTION     x2   Family History: History reviewed. No pertinent family history. Family Psychiatric  History: See H&P Social History:  Social History   Substance and Sexual Activity  Alcohol Use Not Currently  . Frequency: Never   Comment: states hasn't had a drink in months     Social History   Substance and Sexual Activity  Drug Use Never    Social History   Socioeconomic History  . Marital status: Divorced    Spouse name: Not on file  . Number of children: Not on file  . Years of education: Not on file  . Highest education level: Not on file  Occupational History  . Not on file  Social Needs  . Financial resource strain: Not on file  . Food insecurity:    Worry: Not on file    Inability: Not on file  . Transportation needs:    Medical: Not on file    Non-medical: Not on file  Tobacco Use  . Smoking status: Never Smoker  . Smokeless tobacco: Never Used  Substance and Sexual Activity  . Alcohol use:  Not Currently    Frequency: Never    Comment: states hasn't had a drink in months  . Drug use: Never  . Sexual activity: Not Currently  Lifestyle  . Physical activity:    Days per week: Not on file    Minutes per session: Not on file  . Stress: Not on file  Relationships  . Social connections:    Talks on phone: Not on file    Gets together: Not on file    Attends religious service: Not on file    Active member of club or organization: Not on file    Attends meetings of clubs or organizations: Not on file    Relationship status: Not on file  Other Topics Concern  . Not on file  Social History Narrative  . Not on file   Additional Social History:                         Sleep: Good  Appetite:  Good  Current Medications: Current Facility-Administered Medications  Medication Dose Route Frequency Provider Last Rate Last Dose  . acetaminophen (TYLENOL) tablet 650 mg  650 mg Oral Q6H PRN Clapacs, John T, MD      . alum & mag hydroxide-simeth (MAALOX/MYLANTA) 200-200-20 MG/5ML suspension 30 mL  30 mL Oral Q4H PRN Clapacs, Madie Reno, MD      .  benztropine (COGENTIN) tablet 1 mg  1 mg Oral Q6H PRN Lenward Chancellor, MD       Or  . benztropine mesylate (COGENTIN) injection 1 mg  1 mg Intramuscular Q6H PRN Lenward Chancellor, MD      . carbamazepine (TEGRETOL) tablet 200 mg  200 mg Oral BID McNew, Holly R, MD   200 mg at 12/13/17 2115  . haloperidol (HALDOL) tablet 5 mg  5 mg Oral Q6H PRN Lenward Chancellor, MD   5 mg at 11/22/17 1646   Or  . haloperidol lactate (HALDOL) injection 5 mg  5 mg Intramuscular Q6H PRN Lenward Chancellor, MD      . LORazepam (ATIVAN) injection 1 mg  1 mg Intramuscular Q6H PRN He, Jun, MD   1 mg at 11/22/17 0208  . LORazepam (ATIVAN) tablet 1 mg  1 mg Oral Q6H PRN Lenward Chancellor, MD   1 mg at 11/22/17 1646   Or  . LORazepam (ATIVAN) injection 1 mg  1 mg Intramuscular Q6H PRN Lenward Chancellor, MD      . magnesium hydroxide (MILK OF MAGNESIA)  suspension 30 mL  30 mL Oral Daily PRN Clapacs, John T, MD      . QUEtiapine (SEROQUEL) tablet 300 mg  300 mg Oral QHS McNew, Tyson Babinski, MD   300 mg at 12/13/17 2115  . thiamine (VITAMIN B-1) tablet 100 mg  100 mg Oral Daily Lenward Chancellor, MD   100 mg at 12/12/17 1211  . traZODone (DESYREL) tablet 100 mg  100 mg Oral QHS PRN He, Jun, MD   100 mg at 12/01/17 2142    Lab Results: No results found for this or any previous visit (from the past 48 hour(s)).  Blood Alcohol level:  Lab Results  Component Value Date   ETH <10 73/53/2992    Metabolic Disorder Labs: No results found for: HGBA1C, MPG No results found for: PROLACTIN No results found for: CHOL, TRIG, HDL, CHOLHDL, VLDL, LDLCALC  Physical Findings: AIMS: Facial and Oral Movements Muscles of Facial Expression: None, normal Lips and Perioral Area: None, normal Jaw: None, normal Tongue: None, normal,Extremity Movements Upper (arms, wrists, hands, fingers): None, normal Lower (legs, knees, ankles, toes): None, normal, Trunk Movements Neck, shoulders, hips: None, normal, Overall Severity Severity of abnormal movements (highest score from questions above): None, normal Incapacitation due to abnormal movements: None, normal Patient's awareness of abnormal movements (rate only patient's report): No Awareness, Dental Status Current problems with teeth and/or dentures?: No Does patient usually wear dentures?: No  CIWA:    COWS:     Musculoskeletal: Strength & Muscle Tone: within normal limits Gait & Station: normal Patient leans: N/A  Psychiatric Specialty Exam: Physical Exam  Nursing note and vitals reviewed.   Review of Systems  All other systems reviewed and are negative.   Blood pressure 122/82, pulse 80, temperature 98 F (36.7 C), temperature source Oral, resp. rate 18, height _0  (1.727 m), weight 80.7 kg, SpO2 100 %.Body mass index is 27.06 kg/m.  General Appearance: Casual  Eye Contact:  Minimal  Speech:   Clear and Coherent  Volume:  Normal  Mood:  anxious  Affect:  irritable  Thought Process:  Coherent and Goal Directed  Orientation:  Full (Time, Place, and Person)  Thought Content:  Logical  Suicidal Thoughts:  No  Homicidal Thoughts:  No  Memory:  Immediate;   Poor  Judgement:  Impaired  Insight:  Lacking  Psychomotor Activity:  EPS  Concentration:  Concentration: Poor  Recall:  Poor  Fund of Knowledge:  Fair  Language:  Fair  Akathisia:  No      Assets:  Resilience  ADL's:  Intact  Cognition:  Impaired,  Moderate  Sleep:  Number of Hours: 7     Treatment Plan Summary: 47 yo female admitted due to behavioral issues at the memory care unit.  Pt labile, irritable, Waiting for placement, Mood -Mood -Continue Seroquel 300 mg qhs -Continue Tegretol 200 mg BID  Korsakoff's syndrome -Neurology was already consulted while in hospital in [facility] and workup completed including MRI, CT head, RPR. 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 She also has long history of heavy alcohol abuse and poor dietary intake. Her physical symptoms are improved significant since then and only persistent symptoms are short term memory loss ----MOCA on 9/27/19was 21/30 -PT evaluated and felt she was now independent with cares. -OT recommends supervised living due to poor short term memory issues and help with tracking day of the week and day to day management skills. This could be accomplished in an assisted living or groups home living situation  Dispo -Placement has been an issue due to guardian not agreeing to referrals to group home or family care home. Her guardian will be changing on 10/18.    Lenward Chancellor, MD 12/14/2017, 1:38 PMPatient ID: Kinnie Feil, female   DOB: Oct 19, 1970, 47 y.o.   MRN: [id number] Patient ID: Bria Sparr, female   DOB: Jul 13, 1970, 47 y.o.   MRN: [id number]

## 2017-12-14 NOTE — [facility] Group Notes (Signed)
LCSW Group Therapy Note 12/14/2017 1:15pm  Type of Therapy and Topic: Group Therapy: Feelings Around Returning Home & Establishing a Supportive Framework and Supporting Oneself When Supports Not Available  Participation Level: Did Not Attend  Description of Group:  Patients first processed thoughts and feelings about upcoming discharge. These included fears of upcoming changes, lack of change, new living environments, judgements and expectations from others and overall stigma of mental health issues. The group then discussed the definition of a supportive framework, what that looks and feels like, and how do to discern it from an unhealthy non-supportive network. The group identified different types of supports as well as what to do when your family/friends are less than helpful or unavailable  Therapeutic Goals  1. Patient will identify one healthy supportive network that they can use at discharge. 2. Patient will identify one factor of a supportive framework and how to tell it from an unhealthy network. 3. Patient able to identify one coping skill to use when they do not have positive supports from others. 4. Patient will demonstrate ability to communicate their needs through discussion and/or role plays.  Summary of Patient Progress:  Pt was invited to attend group but chose not to attend. CSW will continue to encourage pt to attend group throughout their admission.   Therapeutic Modalities Cognitive Behavioral Therapy Motivational Interviewing   Nanda Bittick  [name], LCSW 12/14/2017 12:00 PM  

## 2017-12-14 NOTE — Plan of Care (Signed)
Patient has been calm and adjusting well in the unit , attends groups  and participating in group activities , denies SI/HI , no safety concerns,  encourage patient to engage in leisure activities and improve mentally , contract for safety of self and others , sleep long hours, no issues or concerns voiced .   Problem: Activity: Goal: Will identify at least one activity in which they can participate Outcome: Progressing   Problem: Coping: Goal: Ability to identify and develop effective coping behavior will improve Outcome: Progressing Goal: Ability to interact with others will improve Outcome: Progressing Goal: Demonstration of participation in decision-making regarding own care will improve Outcome: Progressing Goal: Ability to use eye contact when communicating with others will improve Outcome: Progressing   Problem: Health Behavior/Discharge Planning: Goal: Identification of resources available to assist in meeting health care needs will improve Outcome: Progressing   Problem: Self-Concept: Goal: Will verbalize positive feelings about self Outcome: Progressing

## 2017-12-14 NOTE — Progress Notes (Signed)
Several attempts to get V/S done, patient refused.

## 2017-12-15 NOTE — Progress Notes (Signed)
[facility] MD Progress Note  12/15/2017 2:04 PM Joanne Johnston  MRN:  [id number] Subjective:  Pt is awake and alert today. She continues to use a journal to assist in her memory each day. She is in a good mood today. She is able to remember most of our conversation that we are trying to get her into a more appropriate level of care and not a locked unit. She is appreciative of this. She is organized in thoughts. She has not had any verbal outbursts. She is taking medications. She is sleeping well. She is fully oriented other than city she states "[place]."   Principal Problem: Unspecified mood (affective) disorder ([place]) Diagnosis:   Patient Active Problem List   Diagnosis Date Noted  . Unspecified mood (affective) disorder ([place]) [F39] 11/18/2017    Priority: High  . 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 Agitation [R45.1] 11/04/2017   Total Time spent with patient: 15 minutes  Past Psychiatric History: See H&P  Past Medical History:  Past Medical History:  Diagnosis Date  . Alcohol dependence ([place])   . Anxiety   . Dementia   . Depression   . Hypertension   . Tachycardia     Past Surgical History:  Procedure Laterality Date  . CESAREAN SECTION     x2   Family History: History reviewed. No pertinent family history. Family Psychiatric  History: See h&P Social History:  Social History   Substance and Sexual Activity  Alcohol Use Not Currently  . Frequency: Never   Comment: states hasn't had a drink in months     Social History   Substance and Sexual Activity  Drug Use Never    Social History   Socioeconomic History  . Marital status: Divorced    Spouse name: Not on file  . Number of children: Not on file  . Years of education: Not on file  . Highest education level: Not on file  Occupational History  . Not on file  Social Needs  . Financial resource strain: Not on file  .  Food insecurity:    Worry: Not on file    Inability: Not on file  . Transportation needs:    Medical: Not on file    Non-medical: Not on file  Tobacco Use  . Smoking status: Never Smoker  . Smokeless tobacco: Never Used  Substance and Sexual Activity  . Alcohol use: Not Currently    Frequency: Never    Comment: states hasn't had a drink in months  . Drug use: Never  . Sexual activity: Not Currently  Lifestyle  . Physical activity:    Days per week: Not on file    Minutes per session: Not on file  . Stress: Not on file  Relationships  . Social connections:    Talks on phone: Not on file    Gets together: Not on file    Attends religious service: Not on file    Active member of club or organization: Not on file    Attends meetings of clubs or organizations: Not on file    Relationship status: Not on file  Other Topics Concern  . Not on file  Social History Narrative  . Not on file   Additional Social History:                         Sleep: Good  Appetite:  Good  Current Medications: Current Facility-Administered Medications  Medication Dose Route Frequency Provider Last Rate Last Dose  . acetaminophen (TYLENOL) tablet 650 mg  650 mg Oral Q6H PRN Clapacs, John T, MD      . alum & mag hydroxide-simeth (MAALOX/MYLANTA) 200-200-20 MG/5ML suspension 30 mL  30 mL Oral Q4H PRN Clapacs, John T, MD      . benztropine (COGENTIN) tablet 1 mg  1 mg Oral Q6H PRN Lenward Chancellor, MD       Or  . benztropine mesylate (COGENTIN) injection 1 mg  1 mg Intramuscular Q6H PRN Lenward Chancellor, MD      . carbamazepine (TEGRETOL) tablet 200 mg  200 mg Oral BID Rechy Bost R, MD   200 mg at 12/15/17 0915  . haloperidol (HALDOL) tablet 5 mg  5 mg Oral Q6H PRN Lenward Chancellor, MD   5 mg at 11/22/17 1646   Or  . haloperidol lactate (HALDOL) injection 5 mg  5 mg Intramuscular Q6H PRN Lenward Chancellor, MD      . LORazepam (ATIVAN) injection 1 mg  1 mg Intramuscular Q6H PRN He,  Jun, MD   1 mg at 11/22/17 0208  . LORazepam (ATIVAN) tablet 1 mg  1 mg Oral Q6H PRN Lenward Chancellor, MD   1 mg at 11/22/17 1646   Or  . LORazepam (ATIVAN) injection 1 mg  1 mg Intramuscular Q6H PRN Lenward Chancellor, MD      . magnesium hydroxide (MILK OF MAGNESIA) suspension 30 mL  30 mL Oral Daily PRN Clapacs, John T, MD      . QUEtiapine (SEROQUEL) tablet 300 mg  300 mg Oral QHS Deniss Wormley, Tyson Babinski, MD   300 mg at 12/14/17 2214  . thiamine (VITAMIN B-1) tablet 100 mg  100 mg Oral Daily Lenward Chancellor, MD   100 mg at 12/15/17 0915  . traZODone (DESYREL) tablet 100 mg  100 mg Oral QHS PRN He, Jun, MD   100 mg at 12/01/17 2142    Lab Results: No results found for this or any previous visit (from the past 48 hour(s)).  Blood Alcohol level:  Lab Results  Component Value Date   ETH <10 17/49/4496    Metabolic Disorder Labs: No results found for: HGBA1C, MPG No results found for: PROLACTIN No results found for: CHOL, TRIG, HDL, CHOLHDL, VLDL, LDLCALC  Physical Findings: AIMS: Facial and Oral Movements Muscles of Facial Expression: None, normal Lips and Perioral Area: None, normal Jaw: None, normal Tongue: None, normal,Extremity Movements Upper (arms, wrists, hands, fingers): None, normal Lower (legs, knees, ankles, toes): None, normal, Trunk Movements Neck, shoulders, hips: None, normal, Overall Severity Severity of abnormal movements (highest score from questions above): None, normal Incapacitation due to abnormal movements: None, normal Patient's awareness of abnormal movements (rate only patient's report): No Awareness, Dental Status Current problems with teeth and/or dentures?: No Does patient usually wear dentures?: No  CIWA:    COWS:     Musculoskeletal: Strength & Muscle Tone: within normal limits Gait & Station: normal Patient leans: N/A  Psychiatric Specialty Exam: Physical Exam  Nursing note and vitals reviewed.   Review of Systems  All other systems reviewed  and are negative.   Blood pressure 122/82, pulse 80, temperature 98 F (36.7 C), temperature source Oral, resp. rate 18, height _0  (1.727 m), weight 80.7 kg, SpO2 100 %.Body mass index is 27.06 kg/m.  General Appearance: Casual  Eye Contact:  Good  Speech:  Clear and Coherent  Volume:  Normal  Mood:  Euthymic  Affect:  Appropriate  Thought Process:  Coherent and Goal Directed  Orientation:  Full (Time, Place, and Person) other than [place]  Thought Content:  Logical  Suicidal Thoughts:  No  Homicidal Thoughts:  No  Memory:  Immediate;   Poor  Judgement:  Impaired  Insight:  Lacking  Psychomotor Activity:  Normal  Concentration:  Concentration: Poor  Recall:  Poor  Fund of Knowledge:  Fair  Language:  Fair  Akathisia:  No      Assets:  Resilience  ADL's:  Intact  Cognition:  Impaired,  Moderate  Sleep:  Number of Hours: 6     Treatment Plan Summary: 47 yo female admitted due to behavioral issues at her memory care unit. Her treatment team continues to feel a fch or group home are a more appropriate level of care for her than a locked unit. She has been calm on the unit. CSW has left message with guardians supervisor to figure out who her new guardian is. We need consents to start making referrals to group homes.   Mood -Continue Seroquel 300 mg qhs -Continue Tegretol 200 mg BID  Korsakoff's syndrome -Neurology was already consulted while in hospital in [facility] and workup completed including MRI, CT head, RPR. 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 She also has long history of heavy alcohol abuse and poor dietary intake. Her physical symptoms are improved significant since then and only persistent symptoms are short term memory loss ----MOCA on 9/27/19was 21/30 -PT evaluated and felt she was now independent with cares. -OT recommends supervised living due to poor short term memory issues and help  with tracking day of the week and day to day management skills. This could be accomplished in an assisted living or groups home living situation  Dispo -Placement has been an issue due to guardian not agreeing to referrals to group home or family care home. She apparently has new guardian as of Friday. CSW placed call to supervisor regarding who this will be and left message  Marylin Crosby, MD 12/15/2017, 2:04 PM

## 2017-12-15 NOTE — Tx Team (Signed)
Interdisciplinary Treatment and Diagnostic Plan Update  12/15/2017 Time of Session: 11am Cilicia Borden MRN: [id number]  Principal Diagnosis: Unspecified mood (affective) disorder (HCC)  Secondary Diagnoses: Principal Problem:   Unspecified mood (affective) disorder (HCC) Active Problems:   Alcohol-induced persisting dementia (HCC)   Korsakoff syndrome (HCC)   Current Medications:  Current Facility-Administered Medications  Medication Dose Route Frequency Provider Last Rate Last Dose  . acetaminophen (TYLENOL) tablet 650 mg  650 mg Oral Q6H PRN Clapacs, John T, MD      . alum & mag hydroxide-simeth (MAALOX/MYLANTA) 200-200-20 MG/5ML suspension 30 mL  30 mL Oral Q4H PRN Clapacs, John T, MD      . benztropine (COGENTIN) tablet 1 mg  1 mg Oral Q6H PRN Beverly Sessions, MD       Or  . benztropine mesylate (COGENTIN) injection 1 mg  1 mg Intramuscular Q6H PRN Beverly Sessions, MD      . carbamazepine (TEGRETOL) tablet 200 mg  200 mg Oral BID McNew, Holly R, MD   200 mg at 12/15/17 0915  . haloperidol (HALDOL) tablet 5 mg  5 mg Oral Q6H PRN Beverly Sessions, MD   5 mg at 11/22/17 1646   Or  . haloperidol lactate (HALDOL) injection 5 mg  5 mg Intramuscular Q6H PRN Beverly Sessions, MD      . LORazepam (ATIVAN) injection 1 mg  1 mg Intramuscular Q6H PRN He, Jun, MD   1 mg at 11/22/17 0208  . LORazepam (ATIVAN) tablet 1 mg  1 mg Oral Q6H PRN Beverly Sessions, MD   1 mg at 11/22/17 1646   Or  . LORazepam (ATIVAN) injection 1 mg  1 mg Intramuscular Q6H PRN Beverly Sessions, MD      . magnesium hydroxide (MILK OF MAGNESIA) suspension 30 mL  30 mL Oral Daily PRN Clapacs, John T, MD      . QUEtiapine (SEROQUEL) tablet 300 mg  300 mg Oral QHS McNew, Ileene Hutchinson, MD   300 mg at 12/14/17 2214  . thiamine (VITAMIN B-1) tablet 100 mg  100 mg Oral Daily Beverly Sessions, MD   100 mg at 12/15/17 0915  . traZODone (DESYREL) tablet 100 mg  100 mg Oral QHS PRN He, Jun, MD   100 mg at 12/01/17 2142    PTA Medications: Medications Prior to Admission  Medication Sig Dispense Refill Last Dose  . [EXPIRED] Multiple Vitamin (MULTI-VITAMINS) TABS Take 1 tablet by mouth daily.    unknown at unknown  . [EXPIRED] QUEtiapine (SEROQUEL) 25 MG tablet Take 25 mg by mouth daily.    unknown at unknown  . [EXPIRED] QUEtiapine (SEROQUEL) 50 MG tablet Take 50 mg by mouth at bedtime.    unknown at unknown  . topiramate (TOPAMAX) 50 MG tablet Take 50 mg by mouth daily.    unknwon at unknown    Patient Stressors: Other: People, buildings, rooms  Patient Strengths: Capable of independent living Physical Health Supportive family/friends  Treatment Modalities: Medication Management, Group therapy, Case management,  1 to 1 session with clinician, Psychoeducation, Recreational therapy.   Physician Treatment Plan for Primary Diagnosis: Unspecified mood (affective) disorder (HCC) Long Term Goal(s): Improvement in symptoms so as ready for discharge Improvement in symptoms so as ready for discharge   Short Term Goals: Ability to identify changes in lifestyle to reduce recurrence of condition will improve Ability to identify changes in lifestyle to reduce recurrence of condition will improve  Medication Management: Evaluate patient's response, side effects, and tolerance of medication regimen.  Therapeutic Interventions: 1 to 1 sessions, Unit Group sessions and Medication administration.  Evaluation of Outcomes: Progressing  Physician Treatment Plan for Secondary Diagnosis: Principal Problem:   Unspecified mood (affective) disorder (HCC) Active Problems:   Alcohol-induced persisting dementia (HCC)   Korsakoff syndrome (HCC)  Long Term Goal(s): Improvement in symptoms so as ready for discharge Improvement in symptoms so as ready for discharge   Short Term Goals: Ability to identify changes in lifestyle to reduce recurrence of condition will improve Ability to identify changes in lifestyle to reduce  recurrence of condition will improve     Medication Management: Evaluate patient's response, side effects, and tolerance of medication regimen.  Therapeutic Interventions: 1 to 1 sessions, Unit Group sessions and Medication administration.  Evaluation of Outcomes: Progressing   RN Treatment Plan for Primary Diagnosis: Unspecified mood (affective) disorder (HCC) Long Term Goal(s): Knowledge of disease and therapeutic regimen to maintain health will improve  Short Term Goals: Ability to identify and develop effective coping behaviors will improve and Compliance with prescribed medications will improve  Medication Management: RN will administer medications as ordered by provider, will assess and evaluate patient's response and provide education to patient for prescribed medication. RN will report any adverse and/or side effects to prescribing provider.  Therapeutic Interventions: 1 on 1 counseling sessions, Psychoeducation, Medication administration, Evaluate responses to treatment, Monitor vital signs and CBGs as ordered, Perform/monitor CIWA, COWS, AIMS and Fall Risk screenings as ordered, Perform wound care treatments as ordered.  Evaluation of Outcomes: Progressing   LCSW Treatment Plan for Primary Diagnosis: Unspecified mood (affective) disorder (HCC) Long Term Goal(s): Safe transition to appropriate next level of care at discharge, Engage patient in therapeutic group addressing interpersonal concerns.  Short Term Goals: Engage patient in aftercare planning with referrals and resources, Identify triggers associated with mental health/substance abuse issues and Increase skills for wellness and recovery  Therapeutic Interventions: Assess for all discharge needs, 1 to 1 time with Social worker, Explore available resources and support systems, Assess for adequacy in community support network, Educate family and significant other(s) on suicide prevention, Complete Psychosocial Assessment,  Interpersonal group therapy.  Evaluation of Outcomes: Progressing   Progress in Treatment: Attending groups: Yes. Participating in groups: Yes. Taking medication as prescribed: Yes. Toleration medication: Yes. Family/Significant other contact made: Yes, individual(s) contacted:  CSW has made contact with pt's guardian, Esmeralda Arthur with [employer] DSS. Patient understands diagnosis: No. Discussing patient identified problems/goals with staff: Yes. Medical problems stabilized or resolved: Yes. Denies suicidal/homicidal ideation: Yes. Issues/concerns per patient self-inventory: No. Other:    New problem(s) identified: No, Describe:     New Short Term/Long Term Goal(s):  Patient Goals:  To be discharged  Discharge Plan or Barriers: TBD.  CSW, Pt.s guardian and Koren Shiver are continuing to explore appropriate residence options.  Pt may be more appropriate to go to an [facility] with CST.   Reason for Continuation of Hospitalization: Other; describe Need to identify and secure appropriate residential placement for discharge.  Estimated Length of Stay: 7 days  Recreational Therapy: Patient Stressors: N/A Patient Goal: Patient will engage in interactions with peers and staff in pro-social manner at least 2x within 5 recreation therapy group sessions  Attendees: Patient: 12/15/2017 11:59 AM  Physician: Corinna Gab, MD 12/15/2017 11:59 AM  Nursing:  12/15/2017 11:59 AM  RN Care Manager: 12/15/2017 11:59 AM  Social Worker: Joneen Roach, LCSWA 12/15/2017 11:59 AM  Recreational Therapist: Danella Deis. Dreama Saa, LRT 12/15/2017 11:59 AM  Other: Lowella Dandy, LCSW 12/15/2017  11:59 AM  Other: Damian Leavell, Chaplian 12/15/2017 11:59 AM  Other: 12/15/2017 11:59 AM    Scribe for Treatment Team: Johny Shears, LCSW 12/15/2017 11:59 AM

## 2017-12-15 NOTE — NC FL2 (Addendum)
[place] MEDICAID FL2 LEVEL OF CARE SCREENING TOOL     IDENTIFICATION  Patient Name: Joanne Johnston Birthdate: 06/14/1970 Sex: female Admission Date (Current Location): 11/14/2017  [place] and [place] Number:  Zane Herald [place]) [id number] R  Facility and Address:  [facility], [address] [place], [place] [address]      Provider Number: [id number]  Attending Physician Name and Address:  Haskell Riling, MD  Relative Name and Phone Number:  Guardian Heath [facility] DSS [phone]    Current Level of Care: Hospital Recommended Level of Care: Other (Comment)(Group Home) Prior Approval Number:    Date Approved/Denied:   PASRR Number:    Discharge Plan: Other (Comment)(Group Home)    Current Diagnoses: Unspecified Mood Disorder Mood Disorder due to General medical condition Korsakoff Syndrome-with persisting short term memory impairment only and inability to live fully independently. MOCA done on 11/21/17 which tests for memory showed issues with delayed recall only. Fully oriented to person, place and date. Fully able to perform ADLs well.   Orientation RESPIRATION BLADDER Height & Weight     Self, Time, Place  Normal Continent Weight: 178 lb (80.7 kg) Height:  5\' 8"  (172.7 cm)  BEHAVIORAL SYMPTOMS/MOOD NEUROLOGICAL BOWEL NUTRITION STATUS  Verbal outbursts on initial hospitalization. These outbursts have decreased significantly since being on medications. A lot of these outbursts are thought to be due to being behavioral related to being in locked facility and lack of independance   Continent    AMBULATORY STATUS COMMUNICATION OF NEEDS Skin   Independent Verbally Normal                       Personal Care Assistance Level of Assistance  Bathing, Feeding, Dressing Bathing Assistance: Independent Feeding assistance: Independent Dressing Assistance: Independent     Functional Limitations Info  Sight, Hearing, Speech Sight  Info: Adequate Hearing Info: Adequate Speech Info: Adequate    SPECIAL CARE FACTORS FREQUENCY                       Contractures Contractures Info: Not present    Additional Factors Info                  Current Medications (12/15/2017):  This is the current hospital active medication list Current Facility-Administered Medications  Medication Dose Route Frequency Provider Last Rate Last Dose  . acetaminophen (TYLENOL) tablet 650 mg  650 mg Oral Q6H PRN Clapacs, John T, MD      . alum & mag hydroxide-simeth (MAALOX/MYLANTA) 200-200-20 MG/5ML suspension 30 mL  30 mL Oral Q4H PRN Clapacs, John T, MD      . benztropine (COGENTIN) tablet 1 mg  1 mg Oral Q6H PRN Beverly Sessions, MD       Or  . benztropine mesylate (COGENTIN) injection 1 mg  1 mg Intramuscular Q6H PRN Beverly Sessions, MD      . carbamazepine (TEGRETOL) tablet 200 mg  200 mg Oral BID Tumeka Chimenti R, MD   200 mg at 12/16/17 7846  . haloperidol (HALDOL) tablet 5 mg  5 mg Oral Q6H PRN Beverly Sessions, MD   5 mg at 11/22/17 1646   Or  . haloperidol lactate (HALDOL) injection 5 mg  5 mg Intramuscular Q6H PRN Beverly Sessions, MD      . LORazepam (ATIVAN) injection 1 mg  1 mg Intramuscular Q6H PRN He, Jun, MD   1 mg at 11/22/17 0208  . LORazepam (ATIVAN)  tablet 1 mg  1 mg Oral Q6H PRN Beverly Sessions, MD   1 mg at 11/22/17 1646   Or  . LORazepam (ATIVAN) injection 1 mg  1 mg Intramuscular Q6H PRN Beverly Sessions, MD      . magnesium hydroxide (MILK OF MAGNESIA) suspension 30 mL  30 mL Oral Daily PRN Clapacs, John T, MD      . QUEtiapine (SEROQUEL) tablet 300 mg  300 mg Oral QHS Devika Dragovich, Ileene Hutchinson, MD   300 mg at 12/15/17 2124  . thiamine (VITAMIN B-1) tablet 100 mg  100 mg Oral Daily Beverly Sessions, MD   100 mg at 12/16/17 0811  . traZODone (DESYREL) tablet 100 mg  100 mg Oral QHS PRN He, Jun, MD   100 mg at 12/15/17 2123     Discharge Medications: Serouqel 300 mg qhs Tegretol 200 mg BID  Relevant  Imaging Results:  Relevant Lab Results:   Additional Information    DARREN Philip Aspen, LCSW

## 2017-12-15 NOTE — Progress Notes (Signed)
Recreation Therapy Notes  Date: 12/15/2017  Time: 9:30 am   Location: Craft room   Behavioral response: N/A   Intervention Topic: Stress  Discussion/Intervention: Patient did not attend group.   Clinical Observations/Feedback:  Patient did not attend group.   Vonnetta Akey LRT/CTRS        Priscila Bean 12/15/2017 12:28 PM

## 2017-12-15 NOTE — Progress Notes (Signed)
D - Patient denies SI/HI/AVH and contracts for safety. Patient was interactive with peers on the unit. Patient had no behavioral outbursts tonight.   A - Patient was cooperative and complaint with medication and treatment plan. Patient was pleasant with this writer, although she refused to get her V/S taken.   R - Patient is monitored Q 15 minutes per unit protocol. Patient remains safe on the unit at this time.

## 2017-12-15 NOTE — BHH Counselor (Signed)
CSW spoke with Joanne Johnston [phone]) from [facility] DSS regarding referring the patients discharge plans. Joanne Johnston voiced many concerns and doesn't seem to be on board with the patient going to a family care home. She doesn't believe that the placement option would be best for the patient dispite the treatment teams suggestions. CSW informed her that this family care home works with a great deal of our patients and are well equipted to manage the patient and her care. She also informed her of a plan to get the patient referred to a community support team. Joanne Johnston seemed unsure and did not want this to "fall back on the state" because "this is what happened to her last time". CSW informed her that this is what the treatment team and Koren Shiver has agreed is best for the patient. CSW sent over consent to be signed for the patient to be referred to [facility] and will send referral once the consent is returned. Joanne Johnston informed CSW that she does not sign the consents but will get it to the Director [employer].  Johny Shears, MSW, Theresia Majors, Bridget Hartshorn Clinical Social Worker 12/15/2017 2:15 PM

## 2017-12-15 NOTE — BHH Counselor (Signed)
CSW called [facility] the Adult Services Supervisor Ernestina Patches [phone]) to see if she has information on the patients new assigned guardian or if she is willing to sign the consent forms to have the patient placed. CSW left a voicemail with a callback number and will continue to follow up with Mrs. Gayland Curry, MSW, Bryon Lions Clinical Social Worker 12/15/2017 9:42 AM

## 2017-12-15 NOTE — Plan of Care (Signed)
D: Pt denies SI/HI/AV hallucinations. Pt slept late today. Patient has been pleasant and cooperative. Patient voices no concerns.  A: Pt was offered support and encouragement. Pt was given scheduled medications. Pt was encourage to attend groups. Q 15 minute checks were done for safety.  R:Pt attends groups and interacts well with peers and staff. Pt is taking medication. Pt has no complaints.Pt receptive to treatment and safety maintained on unit.    Problem: Coping: Goal: Ability to identify and develop effective coping behavior will improve Outcome: Progressing Goal: Ability to interact with others will improve Outcome: Progressing Goal: Ability to use eye contact when communicating with others will improve Outcome: Progressing   Problem: Self-Concept: Goal: Will verbalize positive feelings about self Outcome: Progressing

## 2017-12-15 NOTE — BHH Group Notes (Signed)
B[facility] Group Notes:  (Nursing/MHT/Case Management/Adjunct)  Date:  12/15/2017  Time:  11:31 PM  Type of Therapy:  Group Therapy  Participation Level:  Active  Participation Quality:  Appropriate  Affect:  Appropriate  Cognitive:  Appropriate  Insight:  Appropriate  Engagement in Group:  Engaged  Modes of Intervention:  Discussion  Summary of Progress/Problems:  Joanne Johnston 12/15/2017, 11:31 PM

## 2017-12-16 DIAGNOSIS — F0630 Mood disorder due to known physiological condition, unspecified: Secondary | ICD-10-CM

## 2017-12-16 NOTE — Plan of Care (Signed)
D: Pt denies SI/HI/AV hallucinations. Pt is in a pleasant mood this morning. Patient had a phone call and refused to answer stated she want to sleep. Patient has been cooperative with this Clinical research associate. A: Pt was offered support and encouragement. Pt was given scheduled medications. Pt was encourage to attend groups. Q 15 minute checks were done for safety.  R:Pt attends groups and interacts well with peers and staff. Pt is taking medication. Pt has no complaints.Pt receptive to treatment and safety maintained on unit.    Problem: Coping: Goal: Ability to interact with others will improve Outcome: Progressing Goal: Ability to use eye contact when communicating with others will improve Outcome: Progressing

## 2017-12-16 NOTE — Progress Notes (Signed)
Recreation Therapy Notes   Date: 12/16/2017  Time: 9:30 am   Location: Craft room   Behavioral response: N/A   Intervention Topic: Emotions  Discussion/Intervention: Patient did not attend group.   Clinical Observations/Feedback:  Patient did not attend group.   Samar Venneman LRT/CTRS        Zanaria Morell 12/16/2017 10:49 AM

## 2017-12-16 NOTE — [facility] Group Notes (Signed)
  LCSW Group Therapy Note  Tuesday Oct 22/19 at 1:00pm  Type of Therapy/Topic:  Group Therapy:  Feelings about Diagnosis  Participation Level:  Patient did attend Description of Group:   This group will allow patients to explore their thoughts and feelings about diagnoses they have received. Patients will be guided to explore their level of understanding and acceptance of these diagnoses. Facilitator will encourage patients to process their thoughts and feelings about the reactions of others to their diagnosis and will guide patients in identifying ways to discuss their diagnosis with significant others in their lives. This group will be process-oriented, with patients participating in exploration of their own experiences, giving and receiving support, and processing challenge from other group members.   Therapeutic Goals: 1. Patient will demonstrate understanding of diagnosis as evidenced by identifying two or more symptoms of the disorder 2. Patient will be able to express two feelings regarding the diagnosis 3. Patient will demonstrate their ability to communicate their needs through discussion and/or role play  Summary of Patient Progress:  This patient did follow group content but did not really share as other group members did. She was able to relate to depression yet really didn't feel the need to share yet she would nod her head when others spoke.  Therapeutic Modalities:   Cognitive Behavioral Therapy Brief Therapy Feelings Identification    Kellyjo Edgren LCSW [id number]

## 2017-12-16 NOTE — Progress Notes (Addendum)
[facility] MD Progress Note  12/16/2017 8:15 AM Joanne Johnston  MRN:  [id number] Subjective:  Per RN staff, pt has been calm and cooperative. No outbursts. She has been taking medications. She is caring for herself well. She is eating well. She talks to her family members on the phone very calmly. She interacts well with peers. She was observed laughing and joking with peers yesterday. She is attending groups. Pt is pleasant this morning. She has an interview with a potential group home this morning and is looking forward to this. She got up and ready for this. She is caring well for herself.   Principal Problem: Unspecified mood (affective) disorder ([place]) Diagnosis:   Patient Active Problem List   Diagnosis Date Noted  . Unspecified mood (affective) disorder ([place]) [F39] 11/18/2017    Priority: High  . 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 Her family estimates she would drink at least a bottle or two of wine a day and not eat for several days. She will be linked to [facility] on discharge as she does not have any mental health providers currently.   :  I did more chart review from her hospitalization at [facility] from [place] 2019. It appears she was very confused at the initial part of that hospitalization. Physical therapy did evaluation at that time and recommend SNF at that time due to very unsteady gait and inability to care for herself at that time. Unclear why it progressed into needed locked memory care unit. It also appears the last psychiatric evaluation was in June 2019 and they agreed with diagnosis of Korsakoff syndrome. I did not see any sort of placement recommendation from them other than due to cognitive  issues they felt she was at high risk to live independently. Her physical symptoms and even cognition has improved significantly since then.   Timeline of events:  Hospitalized from April 2019 until September-brought in by bf intoxicated, confused, and very unstable on gait-neurology and psychiatry both consulted and diagnosed her with Gus Puma Korsakoff with behaviors issues (medical/neurological diagnosis). CT and MRI head completed which were both normal. RPR negative, Full workup done.   Long history of alcohol abuse-per family, she used to drink "constantly" and had very poor diet. She has not had anything to drink for several months. She was started on Seroquel during last hospitalization for mood stability.   On 11/21/17, We discussed her memory deficits. She states that she has trouble with short term things. She states, "my long term memory is great." She sat down with me and we did [place] together. She scored 21/30 but this score could have been higher as she was not familiar with this hospital as she was brought here by ambulance. The main deficit was in delayed recall. She did really well on the other sections including visuospatial and executive functioning. Past Medical History:  Past Medical History:  Diagnosis Date  . Alcohol dependence ([place])   . Anxiety   . Dementia   . Depression   . Hypertension   . Tachycardia     Past Surgical History:  Procedure Laterality Date  . CESAREAN SECTION     x2   Family History: History reviewed. No pertinent family history. Family Psychiatric  History: Unknown Social History:  Social History   Substance and Sexual Activity  Alcohol Use Not Currently  . Frequency: Never   Comment: states hasn't had a drink in months     Social History   Substance and Sexual Activity  Drug Use Never    Social History   Socioeconomic History  . Marital status: Divorced    Spouse name: Not on file  . Number of children: Not on file  . Years of  education: Not on file  . Highest education level: Not on file  Occupational History  . Not on file  Social Needs  . Financial resource strain: Not on file  . Food insecurity:    Worry: Not on file    Inability: Not on file  . Transportation needs:    Medical: Not on file    Non-medical: Not on file  Tobacco Use  . Smoking status: Never Smoker  . Smokeless tobacco: Never Used  Substance and Sexual Activity  . Alcohol use: Not Currently    Frequency: Never    Comment: states hasn't had a drink in months  . Drug use: Never  . Sexual activity: Not Currently  Lifestyle  . Physical activity:    Days per week: Not on file    Minutes per session: Not on file  . Stress: Not on file  Relationships  . Social connections:    Talks on phone: Not on file    Gets together: Not on file    Attends religious service: Not on file    Active member of club or organization: Not on file    Attends meetings of clubs or organizations: Not on file    Relationship status: Not on file  Other Topics Concern  . Not on file  Social History Narrative  . Not on file   Additional Social History:                         Sleep: Good  Appetite:  Good  Current Medications: Current Facility-Administered Medications  Medication Dose Route Frequency Provider Last Rate Last Dose  . acetaminophen (TYLENOL) tablet 650 mg  650 mg Oral Q6H PRN Clapacs, John T, MD      . alum & mag hydroxide-simeth (MAALOX/MYLANTA) 200-200-20 MG/5ML suspension 30 mL  30 mL Oral Q4H PRN Clapacs, John T, MD      . benztropine (COGENTIN) tablet 1 mg  1 mg Oral Q6H PRN Lenward Chancellor, MD       Or  . benztropine mesylate (COGENTIN) injection 1 mg  1 mg Intramuscular Q6H PRN Lenward Chancellor, MD      . carbamazepine (TEGRETOL) tablet 200 mg  200 mg Oral BID Terrel Nesheiwat R, MD   200 mg at 12/16/17 9937  . haloperidol (HALDOL) tablet 5 mg  5 mg Oral Q6H PRN Lenward Chancellor, MD   5 mg at 11/22/17 1646   Or  .  haloperidol lactate (HALDOL) injection 5 mg  5 mg Intramuscular Q6H PRN Lenward Chancellor, MD      . LORazepam (ATIVAN) injection 1 mg  1 mg Intramuscular Q6H PRN He, Jun, MD   1 mg at 11/22/17 0208  . LORazepam (ATIVAN) tablet 1 mg  1 mg Oral Q6H PRN Lenward Chancellor, MD   1 mg at 11/22/17 1646   Or  . LORazepam (ATIVAN) injection 1 mg  1 mg Intramuscular Q6H PRN Lenward Chancellor, MD      . magnesium  hydroxide (MILK OF MAGNESIA) suspension 30 mL  30 mL Oral Daily PRN Clapacs, John T, MD      . QUEtiapine (SEROQUEL) tablet 300 mg  300 mg Oral QHS Francena Zender, Tyson Babinski, MD   300 mg at 12/15/17 2124  . thiamine (VITAMIN B-1) tablet 100 mg  100 mg Oral Daily Lenward Chancellor, MD   100 mg at 12/16/17 0811  . traZODone (DESYREL) tablet 100 mg  100 mg Oral QHS PRN He, Jun, MD   100 mg at 12/15/17 2123    Lab Results: No results found for this or any previous visit (from the past 48 hour(s)).  Blood Alcohol level:  Lab Results  Component Value Date   ETH <10 12/45/8099    Metabolic Disorder Labs: No results found for: HGBA1C, MPG No results found for: PROLACTIN No results found for: CHOL, TRIG, HDL, CHOLHDL, VLDL, LDLCALC  Physical Findings: AIMS: Facial and Oral Movements Muscles of Facial Expression: None, normal Lips and Perioral Area: None, normal Jaw: None, normal Tongue: None, normal,Extremity Movements Upper (arms, wrists, hands, fingers): None, normal Lower (legs, knees, ankles, toes): None, normal, Trunk Movements Neck, shoulders, hips: None, normal, Overall Severity Severity of abnormal movements (highest score from questions above): None, normal Incapacitation due to abnormal movements: None, normal Patient's awareness of abnormal movements (rate only patient's report): No Awareness, Dental Status Current problems with teeth and/or dentures?: No Does patient usually wear dentures?: No  CIWA:    COWS:     Musculoskeletal: Strength & Muscle Tone: within normal limits Gait &  Station: normal Patient leans: N/A  Psychiatric Specialty Exam: Physical Exam  Nursing note and vitals reviewed.   Review of Systems  All other systems reviewed and are negative.   Blood pressure 110/61, pulse 76, temperature 98.2 F (36.8 C), temperature source Oral, resp. rate 18, height _0  (1.727 m), weight 80.7 kg, SpO2 100 %.Body mass index is 27.06 kg/m.  General Appearance: Casual  Eye Contact:  Good  Speech:  Clear and Coherent  Volume:  Normal  Mood:  Euthymic  Affect:  Congruent  Thought Process:  Coherent and Goal Directed  Orientation:  Full (Time, Place, and Person)  Thought Content:  Logical  Suicidal Thoughts:  No  Homicidal Thoughts:  No  Memory:  Immediate;   Poor  Judgement:  Impaired  Insight:  Lacking  Psychomotor Activity:  Normal  Concentration:  Concentration: Fair  Recall:  Poor  Fund of Knowledge:  Fair  Language:  Fair  Akathisia:  No      Assets:  Resilience  ADL's:  Intact  Cognition:  WNL  Sleep:  Number of Hours: 7.15     Treatment Plan Summary: 47 yo female admitted due to some verbal altercation at the [facility] where she was placed following an extended hospitalization. I continue to believe that this was much too restrictive level of care for her and caused her to act out.  Her memory and thinking has improved significantly since her long term hospitalization earlier this year. She has been completely calm and cooperative on the unit with no verbal outbursts in several days if not weeks. Most of the verbal outbursts are only related to lack of her ability to have her personal items on the unit. She is able to calm herself down after a few minutes.  She has not been physically aggressive at all. She is organized in thoughts and taking her medications daily with no issues. She continues to have short term  memory issues and often forgets conversations from previous day but able to care for herself well and caring for her ADLs.  Her long  term memory is intact. I have been able to have great conversations with her. I have been trying to involve her in her care as much as possible and she responds very well to this. She expresses her frustration with being locked up in a hospital and most recently in a memory care unit with people not close to her age.  This was much too restrictive and a very inappropriate level of care as she does not have severe dementia. She has had some difficulty and frustration with change in independence. MOCA was done on 11/21/17 showed some difficulty with delayed recall but overall she did well. She scored 21/30. I feel that a locked memory care unit was too restrictive level of care for her and would benefit much more by unlocked facility but with supervision which she is very open to. I don't feel she can live fully independently at this time. Occupational and physical therapy both agree with this level of care. I have spoken to her family who also feel that any locked facility is too restrictive and hope that she can go to a group home or [facility]. She is higher functioning and has been adequately able to care for herself. I wouldn't classify her memory issues as "severe" and more mild-moderate deficit in short term memory.  I think she would benefit more from an assisted living type environment or group home setting and this would allow her more independence and allow her to thrive. She can be linked to a support team for further support.  This is also what she is wanting. She could benefit from some supervision of medications and orientation on a daily basis.   Plan:  Mood -Continue Seroquel 300 mg qhs -Continue Tegretol 200 mg BID  Korsakoff's syndrome -Neurology was already consulted while in hospital in [facility] and workup completed including MRI, CT head, RPR. 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 She also has long history of heavy alcohol  abuse and poor dietary intake. Her physical symptoms are improved significant since then and only persistent symptoms are short term memory loss ----MOCA on 9/27/19was 21/30 -PT evaluated and felt she was now independent with cares. -OT recommends supervised living due to poor short term memory issues and help with tracking day of the week and day to day management skills. This could be accomplished in an assisted living or groups home living situation  Dispo -CSW has spoken with supervisor with DSS and did get a consent signed for referrals to group homes which is an appropriate level of care for patient at this time.   Marylin Crosby, MD 12/16/2017, 8:15 AM

## 2017-12-16 NOTE — B[facility] Counselor (Signed)
Joanne Johnston came to interview the patient for placement into his home. Joanne Johnston reports that he only has 1 female bed available now and is accepting another patient on the unit. He reports that he manages several group homes and knows of one in particular that may be a good fit for the patient. He reports that he will reach out to the owner and give the CSW a call back regarding placement.  CSW will also request a consent for another group home placement located in [facility]. CSW spoke with the owner and she has female beds available. CSW will request a consent from the patients guardian.   Joanne Johnston, MSW, Theresia Majors, Bridget Hartshorn Clinical Social Worker 12/16/2017 11:44 AM

## 2017-12-17 DIAGNOSIS — F0630 Mood disorder due to known physiological condition, unspecified: Secondary | ICD-10-CM

## 2017-12-17 NOTE — Progress Notes (Signed)
[facility] MD Progress Note  12/17/2017 3:04 PM Joanne Johnston  MRN:  [id number] Subjective:  Pt is out in the milieu interacting well with peers. She had a very calm and appropriate conversation with me. We discussed that we are still trying to find appropriate placement. She has been taking medications. She is sleeping well. She is caring for herself well. She did have one very brief verbal outburst later in the afternoon. She was yelling but immediately calmed down when she went into her room.   Care conference done with [facility] care managers, [place] guardian, and CSW. This provider reviewed events from last extended hospitalization and why she was initially recommended for SNF (this was a PT recommendation). Discussed progress patient has made. Discussed our recommendations of a family care home or group home setting as she does not meet criteria for higher level of care. They discussed that she may not meet criteria for having medicaid once she discharges due to her income being too high. CSW will reach out to [facility] to discuss if they require medicaid of if only having disability would be considered. Her guardians are still hesitant to consent to her going to [facility] but they have no other ideas or recommendations of level of care.   Principal Problem: Unspecified mood (affective) disorder ([place]) Diagnosis:   Patient Active Problem List   Diagnosis Date Noted  . Unspecified mood (affective) disorder ([place]) [F39] 11/18/2017    Priority: High  . 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 Agitation [R45.1] 11/04/2017   Total Time spent with patient: 45 minutes  Past Psychiatric History: See h&P  Past Medical History:  Past Medical History:  Diagnosis Date  . Alcohol dependence ([place])   . Anxiety   . Dementia   . Depression   . Hypertension   . Tachycardia     Past Surgical History:  Procedure Laterality Date  . CESAREAN  SECTION     x2   Family History: History reviewed. No pertinent family history. Family Psychiatric  History: See H&P Social History:  Social History   Substance and Sexual Activity  Alcohol Use Not Currently  . Frequency: Never   Comment: states hasn't had a drink in months     Social History   Substance and Sexual Activity  Drug Use Never    Social History   Socioeconomic History  . Marital status: Divorced    Spouse name: Not on file  . Number of children: Not on file  . Years of education: Not on file  . Highest education level: Not on file  Occupational History  . Not on file  Social Needs  . Financial resource strain: Not on file  . Food insecurity:    Worry: Not on file    Inability: Not on file  . Transportation needs:    Medical: Not on file    Non-medical: Not on file  Tobacco Use  . Smoking status: Never Smoker  . Smokeless tobacco: Never Used  Substance and Sexual Activity  . Alcohol use: Not Currently    Frequency: Never    Comment: states hasn't had a drink in months  . Drug use: Never  . Sexual activity: Not Currently  Lifestyle  . Physical activity:    Days per week: Not on file    Minutes per session: Not on file  . Stress: Not on file  Relationships  . Social connections:  Talks on phone: Not on file    Gets together: Not on file    Attends religious service: Not on file    Active member of club or organization: Not on file    Attends meetings of clubs or organizations: Not on file    Relationship status: Not on file  Other Topics Concern  . Not on file  Social History Narrative  . Not on file   Additional Social History:                         Sleep: Good  Appetite:  Good  Current Medications: Current Facility-Administered Medications  Medication Dose Route Frequency Provider Last Rate Last Dose  . acetaminophen (TYLENOL) tablet 650 mg  650 mg Oral Q6H PRN Clapacs, John T, MD      . alum & mag hydroxide-simeth  (MAALOX/MYLANTA) 200-200-20 MG/5ML suspension 30 mL  30 mL Oral Q4H PRN Clapacs, John T, MD      . benztropine (COGENTIN) tablet 1 mg  1 mg Oral Q6H PRN Lenward Chancellor, MD       Or  . benztropine mesylate (COGENTIN) injection 1 mg  1 mg Intramuscular Q6H PRN Lenward Chancellor, MD      . carbamazepine (TEGRETOL) tablet 200 mg  200 mg Oral BID Tamas Suen R, MD   200 mg at 12/17/17 1149  . haloperidol (HALDOL) tablet 5 mg  5 mg Oral Q6H PRN Lenward Chancellor, MD   5 mg at 11/22/17 1646   Or  . haloperidol lactate (HALDOL) injection 5 mg  5 mg Intramuscular Q6H PRN Lenward Chancellor, MD      . LORazepam (ATIVAN) injection 1 mg  1 mg Intramuscular Q6H PRN He, Jun, MD   1 mg at 11/22/17 0208  . LORazepam (ATIVAN) tablet 1 mg  1 mg Oral Q6H PRN Lenward Chancellor, MD   1 mg at 11/22/17 1646   Or  . LORazepam (ATIVAN) injection 1 mg  1 mg Intramuscular Q6H PRN Lenward Chancellor, MD      . magnesium hydroxide (MILK OF MAGNESIA) suspension 30 mL  30 mL Oral Daily PRN Clapacs, John T, MD      . QUEtiapine (SEROQUEL) tablet 300 mg  300 mg Oral QHS Maxmilian Trostel, Tyson Babinski, MD   300 mg at 12/16/17 2132  . thiamine (VITAMIN B-1) tablet 100 mg  100 mg Oral Daily Lenward Chancellor, MD   100 mg at 12/17/17 1148  . traZODone (DESYREL) tablet 100 mg  100 mg Oral QHS PRN He, Jun, MD   100 mg at 12/15/17 2123    Lab Results: No results found for this or any previous visit (from the past 60 hour(s)).  Blood Alcohol level:  Lab Results  Component Value Date   ETH <10 93/23/5573    Metabolic Disorder Labs: No results found for: HGBA1C, MPG No results found for: PROLACTIN No results found for: CHOL, TRIG, HDL, CHOLHDL, VLDL, LDLCALC  Physical Findings: AIMS: Facial and Oral Movements Muscles of Facial Expression: None, normal Lips and Perioral Area: None, normal Jaw: None, normal Tongue: None, normal,Extremity Movements Upper (arms, wrists, hands, fingers): None, normal Lower (legs, knees, ankles, toes):  None, normal, Trunk Movements Neck, shoulders, hips: None, normal, Overall Severity Severity of abnormal movements (highest score from questions above): None, normal Incapacitation due to abnormal movements: None, normal Patient's awareness of abnormal movements (rate only patient's report): No Awareness, Dental Status Current problems with teeth and/or dentures?: No Does  patient usually wear dentures?: No  CIWA:    COWS:     Musculoskeletal: Strength & Muscle Tone: within normal limits Gait & Station: normal Patient leans: N/A  Psychiatric Specialty Exam: Physical Exam  Nursing note and vitals reviewed.   Review of Systems  All other systems reviewed and are negative.   Blood pressure 110/61, pulse 76, temperature 98.2 F (36.8 C), temperature source Oral, resp. rate 18, height '5\' 8"'  (1.727 m), weight 80.7 kg, SpO2 100 %.Body mass index is 27.06 kg/m.  General Appearance: Casual  Eye Contact:  Good  Speech:  Clear and Coherent  Volume:  Normal  Mood:  Irritable  Affect:  Labile  Thought Process:  Coherent and Goal Directed  Orientation:  Full (Time, Place, and Person)  Thought Content:  Logical  Suicidal Thoughts:  No  Homicidal Thoughts:  No  Memory:  Immediate;   Poor  Judgement:  Impaired  Insight:  Lacking  Psychomotor Activity:  Normal  Concentration:  Concentration: Poor  Recall:  Poor  Fund of Knowledge:  Fair  Language:  Fair  Akathisia:  No      Assets:  Resilience  ADL's:  Intact  Cognition:  Impaired,  Moderate  Sleep:  Number of Hours: 6.3     Treatment Plan Summary: 47 yo female who is waiting placement at appropriate level of care. Her guardians are still hesitant to have her placed at [facility] but pt has no other options as she does not meet criteria for any other level of care. She will also possibly lose medicaid once she leaves hospital as she makes too much  Money per DSS. CSW will reach out to [facility] to see if there are any options for people who do  not have medicaid but do have disability income.   Plan:  Mood -Continue Seroquel 300 mg qhs -Continue Tegretol 200 mg BID  Korsakoff's syndrome -Neurology was already consulted while in hospital in [facility] and workup completed including MRI, CT head, RPR. 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 She also has long history of heavy alcohol abuse and poor dietary intake. Her physical symptoms are improved significant since then and only persistent symptoms are short term memory loss ----MOCA on 9/27/19was 21/30 -PT evaluated and felt she was now independent with cares. -OT recommends supervised living due to poor short term memory issues and help with tracking day of the week and day to day management skills. This could be accomplished in an assisted living or groups home living situation  Dispo -We had care conference today to discuss options for patient. Guardians will attempt to get consents signed so we can reach out to [facility] to see if she will qualify. They are still hesitant for this level of care for unclear reasons.  Marylin Crosby, MD 12/17/2017, 3:04 PM

## 2017-12-17 NOTE — Plan of Care (Signed)
Patient has been calm and cooperative  in the unit with out busts, but has been compliant with medication, seen in the common area with peers interacting and socializing, depressed mood sometimes, organized in thoughts, contract for safety of self and others , voice no concerns , sleep long hours , appetite is good , support and encouragement is provided to improve emotional and   mental state , no distress noted.   Problem: Coping: Goal: Ability to identify and develop effective coping behavior will improve Outcome: Progressing Goal: Ability to interact with others will improve Outcome: Progressing Goal: Demonstration of participation in decision-making regarding own care will improve Outcome: Progressing Goal: Ability to use eye contact when communicating with others will improve Outcome: Progressing   Problem: Health Behavior/Discharge Planning: Goal: Identification of resources available to assist in meeting health care needs will improve Outcome: Progressing   Problem: Self-Concept: Goal: Will verbalize positive feelings about self Outcome: Progressing

## 2017-12-17 NOTE — [facility] Group Notes (Signed)
  Emotional Regulation October 23 1PM  Type of Therapy/Topic:  Group Therapy:  Emotion Regulation  Participation Level: Good Participation  Description of Group:   The purpose of this group is to assist patients in learning to regulate negative emotions and experience positive emotions. Patients will be guided to discuss ways in which they have been vulnerable to their negative emotions. These vulnerabilities will be juxtaposed with experiences of positive emotions or situations, and patients will be challenged to use positive emotions to combat negative ones. Special emphasis will be placed on coping with negative emotions in conflict situations, and patients will process healthy conflict resolution skills.  Therapeutic Goals: 1. Patient will identify two positive emotions or experiences to reflect on in order to balance out negative emotions 2. Patient will label two or more emotions that they find the most difficult to experience 3. Patient will demonstrate positive conflict resolution skills through discussion and/or role plays  Summary of Patient Progress:  This patient remained quiet and was shocked at the laughter of her peers, she was able to follow content but remained silent for the most part of group. LCSW asked her how she manages and copes with emotions and she stated I remain silent and that's how I contain my emotions.      Therapeutic Modalities:   Cognitive Behavioral Therapy Feelings Identification Dialectical Behavioral Therapy   Heather Streeper LCSW 2:15 pm

## 2017-12-17 NOTE — [facility] Group Notes (Signed)
[facility] Group Notes:  (Nursing/MHT/Case Management/Adjunct)  Date:  12/17/2017  Time:  11:06 PM  Type of Therapy:  Group Therapy  Participation Level:  Active  Participation Quality:  Appropriate  Affect:  Appropriate  Cognitive:  Oriented  Insight:  Appropriate  Engagement in Group:  Engaged  Modes of Intervention:  Discussion  Summary of Progress/Problems: Patient participated fully in group and attentive , did not say much but watched her peers with smiles. Lelan Pons 12/17/2017, 11:06 PM

## 2017-12-17 NOTE — Progress Notes (Signed)
Patient had an outburst earlier about belongings she was not allowed to have on the unit. Patient also believed someone took body wash from her personal belongings. Patient went to her room and yelled, but calmed down after 10 minutes.

## 2017-12-17 NOTE — [facility] Counselor (Signed)
CSW attempted to contact Alisha [phone]) a [facility] home director that she was referred to from [facility] to discuss patient placement. CSW left a voicemail with a callback number.  Johny Shears, MSW, Theresia Majors, Bridget Hartshorn Clinical Social Worker 12/17/2017 3:46 PM

## 2017-12-17 NOTE — Progress Notes (Signed)
Recreation Therapy Notes  Date: 12/17/2017  Time: 9:30 am   Location: Craft room   Behavioral response: N/A   Intervention Topic: Communication  Discussion/Intervention: Patient did not attend group.   Clinical Observations/Feedback:  Patient did not attend group.   Brycelynn Stampley LRT/CTRS        Lenn Volker 12/17/2017 12:14 PM

## 2017-12-17 NOTE — Plan of Care (Signed)
Patient was seen in the day room interacting positively with the staff and peers. Patient used fair eye contact with this Clinical research associate and was seen using fair eye contact with the staff and peers on the unit.   Problem: Coping: Goal: Ability to interact with others will improve Outcome: Progressing   Problem: Coping: Goal: Ability to use eye contact when communicating with others will improve Outcome: Progressing

## 2017-12-17 NOTE — Plan of Care (Signed)
Patient is alert and oriented, denies SI, HI and AVH. Patient is very pleasant  and appropriate on the unit, no loud outburst on the unit today. Patient takes medication without issue. Patient rates pain 0/10. Patient attends groups and no self injurious behaviors noted. Nurse will continue to monitor. Problem: Coping: Goal: Ability to identify and develop effective coping behavior will improve Outcome: Progressing Goal: Ability to interact with others will improve Outcome: Progressing Goal: Demonstration of participation in decision-making regarding own care will improve Outcome: Progressing Goal: Ability to use eye contact when communicating with others will improve Outcome: Progressing   Problem: Self-Concept: Goal: Will verbalize positive feelings about self Outcome: Progressing

## 2017-12-18 NOTE — Plan of Care (Signed)
Patient very irritable today, refused medications, had yelling episodes and paranoia of staff stealing belongings. Patient denies SI, HI and AVH. Patient very labile. Nurse to continue to monitor. Problem: Coping: Goal: Ability to identify and develop effective coping behavior will improve Outcome: Not Progressing Goal: Ability to interact with others will improve Outcome: Not Progressing Goal: Demonstration of participation in decision-making regarding own care will improve Outcome: Progressing Goal: Ability to use eye contact when communicating with others will improve Outcome: Progressing   Problem: Health Behavior/Discharge Planning: Goal: Identification of resources available to assist in meeting health care needs will improve Outcome: Progressing

## 2017-12-18 NOTE — Progress Notes (Signed)
Patient is in the hallway screaming at staff as well as patients. Patient is Yelling, " All of my shit has been stolen out of my locker hey patients all of your shit is being stolen." Patient raling up other patients to go into locker and check their belongings. Show of force shown and patient willingly took medications to help manage irritability.

## 2017-12-18 NOTE — Progress Notes (Signed)
[facility] MD Progress Note  12/18/2017 12:24 PM Joanne Johnston  MRN:  [id number] Subjective:  Pt had a brief period of verbal yelling yesterday as she thought her body wash was stolen from her belongings. She went to her room and yelled but was able to calm herself down. She did not require any prns. She was not physically agitated at all. She is calm this morning. She reports sleeping well. Discussed that we continue to look into a [facility] or group home setting for her to go to.   Principal Problem: Unspecified mood (affective) disorder ([place]) Diagnosis:   Patient Active Problem List   Diagnosis Date Noted  . Unspecified mood (affective) disorder ([place]) [F39] 11/18/2017    Priority: High  . 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 Agitation [R45.1] 11/04/2017   Total Time spent with patient: 15 minutes  Past Psychiatric History: See H&P  Past Medical History:  Past Medical History:  Diagnosis Date  . Alcohol dependence ([place])   . Anxiety   . Dementia   . Depression   . Hypertension   . Tachycardia     Past Surgical History:  Procedure Laterality Date  . CESAREAN SECTION     x2   Family History: History reviewed. No pertinent family history. Family Psychiatric  History: See H&P Social History:  Social History   Substance and Sexual Activity  Alcohol Use Not Currently  . Frequency: Never   Comment: states hasn't had a drink in months     Social History   Substance and Sexual Activity  Drug Use Never    Social History   Socioeconomic History  . Marital status: Divorced    Spouse name: Not on file  . Number of children: Not on file  . Years of education: Not on file  . Highest education level: Not on file  Occupational History  . Not on file  Social Needs  . Financial resource strain: Not on file  . Food insecurity:    Worry: Not on file    Inability: Not on file  . Transportation needs:   Medical: Not on file    Non-medical: Not on file  Tobacco Use  . Smoking status: Never Smoker  . Smokeless tobacco: Never Used  Substance and Sexual Activity  . Alcohol use: Not Currently    Frequency: Never    Comment: states hasn't had a drink in months  . Drug use: Never  . Sexual activity: Not Currently  Lifestyle  . Physical activity:    Days per week: Not on file    Minutes per session: Not on file  . Stress: Not on file  Relationships  . Social connections:    Talks on phone: Not on file    Gets together: Not on file    Attends religious service: Not on file    Active member of club or organization: Not on file    Attends meetings of clubs or organizations: Not on file    Relationship status: Not on file  Other Topics Concern  . Not on file  Social History Narrative  . Not on file   Additional Social History:                         Sleep: Good  Appetite:  Good  Current Medications: Current Facility-Administered Medications  Medication Dose Route Frequency Provider Last Rate Last Dose  .  acetaminophen (TYLENOL) tablet 650 mg  650 mg Oral Q6H PRN Clapacs, John T, MD      . alum & mag hydroxide-simeth (MAALOX/MYLANTA) 200-200-20 MG/5ML suspension 30 mL  30 mL Oral Q4H PRN Clapacs, John T, MD      . benztropine (COGENTIN) tablet 1 mg  1 mg Oral Q6H PRN Lenward Chancellor, MD       Or  . benztropine mesylate (COGENTIN) injection 1 mg  1 mg Intramuscular Q6H PRN Lenward Chancellor, MD      . carbamazepine (TEGRETOL) tablet 200 mg  200 mg Oral BID Levon Penning R, MD   200 mg at 12/17/17 2157  . haloperidol (HALDOL) tablet 5 mg  5 mg Oral Q6H PRN Lenward Chancellor, MD   5 mg at 11/22/17 1646   Or  . haloperidol lactate (HALDOL) injection 5 mg  5 mg Intramuscular Q6H PRN Lenward Chancellor, MD      . LORazepam (ATIVAN) injection 1 mg  1 mg Intramuscular Q6H PRN He, Jun, MD   1 mg at 11/22/17 0208  . LORazepam (ATIVAN) tablet 1 mg  1 mg Oral Q6H PRN Lenward Chancellor, MD   1 mg at 11/22/17 1646   Or  . LORazepam (ATIVAN) injection 1 mg  1 mg Intramuscular Q6H PRN Lenward Chancellor, MD      . magnesium hydroxide (MILK OF MAGNESIA) suspension 30 mL  30 mL Oral Daily PRN Clapacs, John T, MD      . QUEtiapine (SEROQUEL) tablet 300 mg  300 mg Oral QHS Fiona Coto, Tyson Babinski, MD   300 mg at 12/17/17 2157  . thiamine (VITAMIN B-1) tablet 100 mg  100 mg Oral Daily Lenward Chancellor, MD   100 mg at 12/17/17 1148  . traZODone (DESYREL) tablet 100 mg  100 mg Oral QHS PRN He, Jun, MD   100 mg at 12/15/17 2123    Lab Results: No results found for this or any previous visit (from the past 13 hour(s)).  Blood Alcohol level:  Lab Results  Component Value Date   ETH <10 24/58/0998    Metabolic Disorder Labs: No results found for: HGBA1C, MPG No results found for: PROLACTIN No results found for: CHOL, TRIG, HDL, CHOLHDL, VLDL, LDLCALC  Physical Findings: AIMS: Facial and Oral Movements Muscles of Facial Expression: None, normal Lips and Perioral Area: None, normal Jaw: None, normal Tongue: None, normal,Extremity Movements Upper (arms, wrists, hands, fingers): None, normal Lower (legs, knees, ankles, toes): None, normal, Trunk Movements Neck, shoulders, hips: None, normal, Overall Severity Severity of abnormal movements (highest score from questions above): None, normal Incapacitation due to abnormal movements: None, normal Patient's awareness of abnormal movements (rate only patient's report): No Awareness, Dental Status Current problems with teeth and/or dentures?: No Does patient usually wear dentures?: No  CIWA:    COWS:     Musculoskeletal: Strength & Muscle Tone: within normal limits Gait & Station: normal Patient leans: N/A  Psychiatric Specialty Exam: Physical Exam  Nursing note and vitals reviewed.   Review of Systems  All other systems reviewed and are negative.   Blood pressure 118/87, pulse 88, temperature 98.5 F (36.9 C),  temperature source Oral, resp. rate 17, height _0  (1.727 m), weight 80.7 kg, SpO2 100 %.Body mass index is 27.06 kg/m.  General Appearance: Casual  Eye Contact:  Fair  Speech:  Clear and Coherent  Volume:  Normal  Mood:  Irritable  Affect:  Labile  Thought Process:  Coherent and Goal Directed  Orientation:  Full (Time, Place, and Person)  Thought Content:  Logical  Suicidal Thoughts:  No  Homicidal Thoughts:  No  Memory:  Immediate;   Poor  Judgement:  Impaired  Insight:  Lacking  Psychomotor Activity:  Normal  Concentration:  Concentration: Poor  Recall:  Poor  Fund of Knowledge:  Fair  Language:  Fair  Akathisia:  No      Assets:  Resilience  ADL's:  Intact  Cognition:  Impaired,  Moderate  Sleep:  Number of Hours: 7.3     Treatment Plan Summary: 47 yo female admitted due to agitation at memory care unit. She had brief verbal outburst yesterday related to her body wash going missing in her belongings. She was able to calm herself down with no need for prns. The outbursts was over an appropriate issue however, it was a bit exaggerated which usually happens with any sort of frontal lobe damage which is likely the cause of her verbal outbursts. She has not been violent and she has been very appropriate when she is around peers. Placement is still an issue at this time.   Plan:  Plan:  Mood -Continue Seroquel 300 mg qhs -Continue Tegretol 200 mg BID  Korsakoff's syndrome -Neurology was already consulted while in hospital in [facility] and workup completed including MRI, CT head, RPR. 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 She also has long history of heavy alcohol abuse and poor dietary intake. Her physical symptoms are improved significant since then and only persistent symptoms are short term memory loss ----MOCA on 9/27/19was 21/30 -PT evaluated and felt she was now independent with cares. -OT  recommends supervised living due to poor short term memory issues and help with tracking day of the week and day to day management skills. This could be accomplished in an assisted living or groups home living situation  Dispo -We had care conference today to discuss options for patient. Guardians will attempt to get consents signed so we can reach out to [facility] to see if she will qualify. They are still hesitant for this level of care for unclear reasons but does not meet criteria for any other level of care  Marylin Crosby, MD 12/18/2017, 12:24 PM

## 2017-12-18 NOTE — Progress Notes (Signed)
D - Patient denies SI/HI/AVH and contracts for safety. Patient was interactive with peers and staff on the unit. Patient had no behavioral outbursts tonight.   A - Patient was cooperative and compliant with medication and procedures on the unit. Patient was pleasant with this Clinical research associate. Patient was given encouragement and support.   R - Patient is monitored Q 15 minutes per unit protocol for safety. Patient remains safe on the unit.

## 2017-12-18 NOTE — BHH Group Notes (Signed)
LCSW Group Therapy Note  12/18/2017 1:00 pm  Type of Therapy/Topic:  Group Therapy:  Balance in Life  Participation Level:  Did Not Attend  Description of Group:    This group will address the concept of balance and how it feels and looks when one is unbalanced. Patients will be encouraged to process areas in their lives that are out of balance and identify reasons for remaining unbalanced. Facilitators will guide patients in utilizing problem-solving interventions to address and correct the stressor making their life unbalanced. Understanding and applying boundaries will be explored and addressed for obtaining and maintaining a balanced life. Patients will be encouraged to explore ways to assertively make their unbalanced needs known to significant others in their lives, using other group members and facilitator for support and feedback.  Therapeutic Goals: 1. Patient will identify two or more emotions or situations they have that consume much of in their lives. 2. Patient will identify signs/triggers that life has become out of balance:  3. Patient will identify two ways to set boundaries in order to achieve balance in their lives:  4. Patient will demonstrate ability to communicate their needs through discussion and/or role plays  Summary of Patient Progress:      Therapeutic Modalities:   Cognitive Behavioral Therapy Solution-Focused Therapy Assertiveness Training  Alease Frame, LCSW 12/18/2017 4:21 PM

## 2017-12-18 NOTE — BHH Group Notes (Signed)
B[facility] Group Notes:  (Nursing/MHT/Case Management/Adjunct)  Date:  12/18/2017  Time:  10:43 PM  Type of Therapy:  Group Therapy  Participation Level:  Did Not Attend   Jinger Neighbors 12/18/2017, 10:43 PM

## 2017-12-18 NOTE — Progress Notes (Signed)
Recreation Therapy Notes  Date: 12/18/2017  Time: 9:30 am   Location: Craft room   Behavioral response: N/A   Intervention Topic: Problem Solving  Discussion/Intervention: Patient did not attend group.   Clinical Observations/Feedback:  Patient did not attend group.   Kamryn Messineo LRT/CTRS        Srikar Chiang 12/18/2017 11:11 AM

## 2017-12-18 NOTE — BHH Counselor (Signed)
CSW called the following places to inquire about placement of the patient:  1. [facility] (4) Lillie P. Jennette Kettle [address], [place] [address] [phone] [id number] Supervised Living DD Adult--- No beds available.  2. [facility] (4) [facility] [address]; [place], [place] [address] [phone] - Number not working 3. [address] (5) [address] [place], [place] [address] [phone]- CSW spoke with Bjorn Loser and she reports that she will leave a message and callback number for Mrs. Debora Rouse.  4. [facility] (3) Omega P. Hilda Blades [address]; [place], [place] [address] [phone] [id number] Supervised Living/Alternative Family Living- Voicemail was full, unsure if it was the right number 5. [facility] (5) Sybil M. [address], [place] [address] [phone]- Number is not working 6. [facility] [place] [facility] [place] (6) [facility] [address], [place] [address] [phone]- CSW left a voicemail with a callback number. 7. [facility] (4) [place] [address]; [place], [place] [address] [phone] Number is not working 8. [address] [facility] (4) [facility] [address]; [place], [place] [address] [phone]- Number continued to ring. CSW unable to leave a voicemail. 9. [facility] (4) [address] [place], [place] [address] [phone]- No female beds available.  10. [facility] (4) [address] [place], [place] [address] [phone]- No female beds available. 11. [facility] (5) [facility] [address] [place], [place] [address] [phone]- Number continued to ring. CSW unable to leave a voicemail.  Johny Shears, MSW, Theresia Majors, Bridget Hartshorn Clinical Social Worker 12/18/2017 12:02 PM

## 2017-12-18 NOTE — Plan of Care (Signed)
Patient had an outburst on the unit this afternoon. Patient given medications after the outburst. (See Mar) Patient is still asleep, but being monitored Q 15 min per unit protocol.   Problem: Coping: Goal: Demonstration of participation in decision-making regarding own care will improve Outcome: Not Progressing

## 2017-12-19 LAB — CBC WITH DIFFERENTIAL/PLATELET
Abs Immature Granulocytes: 0.01 10*3/uL (ref 0.00–0.07)
BASOS ABS: 0 10*3/uL (ref 0.0–0.1)
Basophils Relative: 0 %
Eosinophils Absolute: 0.1 10*3/uL (ref 0.0–0.5)
Eosinophils Relative: 2 %
HEMATOCRIT: 40 % (ref 36.0–46.0)
HEMOGLOBIN: 13.1 g/dL (ref 12.0–15.0)
Immature Granulocytes: 0 %
LYMPHS ABS: 0.9 10*3/uL (ref 0.7–4.0)
Lymphocytes Relative: 19 %
MCH: 27.6 pg (ref 26.0–34.0)
MCHC: 32.8 g/dL (ref 30.0–36.0)
MCV: 84.2 fL (ref 80.0–100.0)
MONO ABS: 0.3 10*3/uL (ref 0.1–1.0)
MONOS PCT: 7 %
Neutro Abs: 3.6 10*3/uL (ref 1.7–7.7)
Neutrophils Relative %: 72 %
Platelets: 242 10*3/uL (ref 150–400)
RBC: 4.75 MIL/uL (ref 3.87–5.11)
RDW: 12.2 % (ref 11.5–15.5)
WBC: 5 10*3/uL (ref 4.0–10.5)
nRBC: 0 % (ref 0.0–0.2)

## 2017-12-19 LAB — COMPREHENSIVE METABOLIC PANEL
ALBUMIN: 4.3 g/dL (ref 3.5–5.0)
ALK PHOS: 81 U/L (ref 38–126)
ALT: 22 U/L (ref 0–44)
AST: 35 U/L (ref 15–41)
Anion gap: 9 (ref 5–15)
BILIRUBIN TOTAL: 0.6 mg/dL (ref 0.3–1.2)
BUN: 10 mg/dL (ref 6–20)
CALCIUM: 9.1 mg/dL (ref 8.9–10.3)
CO2: 27 mmol/L (ref 22–32)
Chloride: 99 mmol/L (ref 98–111)
Creatinine, Ser: 0.8 mg/dL (ref 0.44–1.00)
GFR calc Af Amer: >60 mL/min (ref >60)
GFR calc non Af Amer: >60 mL/min (ref >60)
Glucose, Bld: 133 mg/dL — ABNORMAL HIGH (ref 70–99)
Potassium: 4 mmol/L (ref 3.5–5.1)
Sodium: 135 mmol/L (ref 135–145)
TOTAL PROTEIN: 7.3 g/dL (ref 6.5–8.1)

## 2017-12-19 LAB — CARBAMAZEPINE LEVEL, TOTAL: Carbamazepine Lvl: 3 ug/mL — ABNORMAL LOW (ref 4.0–12.0)

## 2017-12-19 MED ORDER — QUETIAPINE FUMARATE 200 MG PO TABS
400.0000 mg | ORAL_TABLET | Freq: Every day | ORAL | Status: DC
  Administered 2017-12-19 – 2018-01-01 (×14): 400 mg via ORAL
  Filled 2017-12-19 (×4): qty 2
  Filled 2017-12-19: qty 4
  Filled 2017-12-19 (×10): qty 2

## 2017-12-19 NOTE — Progress Notes (Signed)
D - Patient had an outburst on the unit this afternoon. Patient was medicated after the outburst, (See Mar). Patient has been asleep on the unit all night.   A - Patient is monitored Q 15 minutes per unit protocol for safety.  R - Patient remains safe on the unit.

## 2017-12-19 NOTE — Progress Notes (Signed)
Recreation Therapy Notes  Date: 12/19/2017  Time: 9:30 am   Location: Craft room   Behavioral response: N/A   Intervention Topic: Leisure  Discussion/Intervention: Patient did not attend group.   Clinical Observations/Feedback:  Patient did not attend group.   Loreal Schuessler LRT/CTRS         Joanne Johnston 12/19/2017 11:32 AM

## 2017-12-19 NOTE — BHH Group Notes (Signed)
B[facility] Group Notes:  (Nursing/MHT/Case Management/Adjunct)  Date:  12/19/2017  Time:  10:48 PM  Type of Therapy:  Group Therapy  Participation Level:  Active  Participation Quality:  Appropriate  Affect:  Appropriate  Cognitive:  Appropriate  Insight:  Appropriate  Engagement in Group:  Engaged  Modes of Intervention:  Discussion  Summary of Progress/Problems:  Joanne Johnston 12/19/2017, 10:48 PM

## 2017-12-19 NOTE — BHH Counselor (Signed)
CSW received consent from [facility] and sent over all information requested from Mrs. Laural Benes with [facility].  Johny Shears, MSW, Theresia Majors, Bridget Hartshorn Clinical Social Worker 12/19/2017 2:06 PM

## 2017-12-19 NOTE — BHH Counselor (Signed)
CSW spoke with Blake Divine with [facility] for a bed placement for the patient. She reports that she does have female beds avalible and provides 24/7 supervision as she lives in the home. She requested patient information such as an FL2, progress notes on behavior and medication list. She reports that she can review this with her director and get back with CSW fast for placement decision. CSW called Dionne Katrinka Blazing, [facility] SW Supervisor to get the consent signed to coordinate care for the patient. The consent was sent to Mrs. Smith. CSW will send requested paperwork to Mrs. Laural Benes once there is a consent obtained from [facility]   Johny Shears, MSW, Bryon Lions Clinical Social Worker 12/19/2017 12:04 PM

## 2017-12-19 NOTE — Progress Notes (Signed)
Assumed care for this patient @0300 . Patient slept throughout the shift. No apparent distress. Will endorse care to oncoming shift.

## 2017-12-19 NOTE — Plan of Care (Signed)
Patient is alert and oriented, denies SI/HI and AVH. Patient is very pleasant today and cooperative. Patient took medications without any problems. Staff has noticed that during the day around 12 pm -1 pm after telephone time patient comes to nurses station and ask to go to her locker. Patient has to be re-educated on items are kept locked for safety reasons and will be released to patient on day of discharge. No self injurious behaviors observed. Problem: Coping: Goal: Ability to identify and develop effective coping behavior will improve Outcome: Progressing Goal: Ability to interact with others will improve Outcome: Progressing Goal: Demonstration of participation in decision-making regarding own care will improve Outcome: Progressing Goal: Ability to use eye contact when communicating with others will improve Outcome: Progressing   Problem: Health Behavior/Discharge Planning: Goal: Identification of resources available to assist in meeting health care needs will improve Outcome: Progressing

## 2017-12-19 NOTE — Tx Team (Signed)
Interdisciplinary Treatment and Diagnostic Plan Update  12/19/2017 Time of Session: 8:30am Joanne Johnston MRN: [id number]  Principal Diagnosis: Unspecified mood (affective) disorder (HCC)  Secondary Diagnoses: Principal Problem:   Unspecified mood (affective) disorder (HCC) Active Problems:   Korsakoff syndrome (HCC)   Mood disorder due to a general medical condition   Current Medications:  Current Facility-Administered Medications  Medication Dose Route Frequency Provider Last Rate Last Dose  . acetaminophen (TYLENOL) tablet 650 mg  650 mg Oral Q6H PRN Clapacs, John T, MD      . alum & mag hydroxide-simeth (MAALOX/MYLANTA) 200-200-20 MG/5ML suspension 30 mL  30 mL Oral Q4H PRN Clapacs, John T, MD      . benztropine (COGENTIN) tablet 1 mg  1 mg Oral Q6H PRN Beverly Sessions, MD       Or  . benztropine mesylate (COGENTIN) injection 1 mg  1 mg Intramuscular Q6H PRN Beverly Sessions, MD      . carbamazepine (TEGRETOL) tablet 200 mg  200 mg Oral BID McNew, Holly R, MD   200 mg at 12/19/17 0858  . haloperidol (HALDOL) tablet 5 mg  5 mg Oral Q6H PRN Beverly Sessions, MD   5 mg at 11/22/17 1646   Or  . haloperidol lactate (HALDOL) injection 5 mg  5 mg Intramuscular Q6H PRN Beverly Sessions, MD   5 mg at 12/18/17 1403  . LORazepam (ATIVAN) injection 1 mg  1 mg Intramuscular Q6H PRN He, Jun, MD   1 mg at 11/22/17 0208  . LORazepam (ATIVAN) tablet 1 mg  1 mg Oral Q6H PRN Beverly Sessions, MD   1 mg at 11/22/17 1646   Or  . LORazepam (ATIVAN) injection 1 mg  1 mg Intramuscular Q6H PRN Beverly Sessions, MD   1 mg at 12/18/17 1407  . magnesium hydroxide (MILK OF MAGNESIA) suspension 30 mL  30 mL Oral Daily PRN Clapacs, John T, MD      . QUEtiapine (SEROQUEL) tablet 400 mg  400 mg Oral QHS McNew, Holly R, MD      . thiamine (VITAMIN B-1) tablet 100 mg  100 mg Oral Daily Beverly Sessions, MD   100 mg at 12/19/17 0858  . traZODone (DESYREL) tablet 100 mg  100 mg Oral QHS PRN He, Jun, MD    100 mg at 12/15/17 2123   PTA Medications: Medications Prior to Admission  Medication Sig Dispense Refill Last Dose  . [EXPIRED] Multiple Vitamin (MULTI-VITAMINS) TABS Take 1 tablet by mouth daily.    unknown at unknown  . [EXPIRED] QUEtiapine (SEROQUEL) 25 MG tablet Take 25 mg by mouth daily.    unknown at unknown  . [EXPIRED] QUEtiapine (SEROQUEL) 50 MG tablet Take 50 mg by mouth at bedtime.    unknown at unknown  . topiramate (TOPAMAX) 50 MG tablet Take 50 mg by mouth daily.    unknwon at unknown    Patient Stressors: Other: People, buildings, rooms  Patient Strengths: Capable of independent living Physical Health Supportive family/friends  Treatment Modalities: Medication Management, Group therapy, Case management,  1 to 1 session with clinician, Psychoeducation, Recreational therapy.   Physician Treatment Plan for Primary Diagnosis: Unspecified mood (affective) disorder (HCC) Long Term Goal(s): Improvement in symptoms so as ready for discharge Improvement in symptoms so as ready for discharge   Short Term Goals: Ability to identify changes in lifestyle to reduce recurrence of condition will improve Ability to identify changes in lifestyle to reduce recurrence of condition will improve  Medication Management: Evaluate patient's response,  side effects, and tolerance of medication regimen.  Therapeutic Interventions: 1 to 1 sessions, Unit Group sessions and Medication administration.  Evaluation of Outcomes: Progressing  Physician Treatment Plan for Secondary Diagnosis: Principal Problem:   Unspecified mood (affective) disorder (HCC) Active Problems:   Korsakoff syndrome (HCC)   Mood disorder due to a general medical condition  Long Term Goal(s): Improvement in symptoms so as ready for discharge Improvement in symptoms so as ready for discharge   Short Term Goals: Ability to identify changes in lifestyle to reduce recurrence of condition will improve Ability to identify  changes in lifestyle to reduce recurrence of condition will improve     Medication Management: Evaluate patient's response, side effects, and tolerance of medication regimen.  Therapeutic Interventions: 1 to 1 sessions, Unit Group sessions and Medication administration.  Evaluation of Outcomes: Progressing   RN Treatment Plan for Primary Diagnosis: Unspecified mood (affective) disorder (HCC) Long Term Goal(s): Knowledge of disease and therapeutic regimen to maintain health will improve  Short Term Goals: Ability to identify and develop effective coping behaviors will improve and Compliance with prescribed medications will improve  Medication Management: RN will administer medications as ordered by provider, will assess and evaluate patient's response and provide education to patient for prescribed medication. RN will report any adverse and/or side effects to prescribing provider.  Therapeutic Interventions: 1 on 1 counseling sessions, Psychoeducation, Medication administration, Evaluate responses to treatment, Monitor vital signs and CBGs as ordered, Perform/monitor CIWA, COWS, AIMS and Fall Risk screenings as ordered, Perform wound care treatments as ordered.  Evaluation of Outcomes: Progressing   LCSW Treatment Plan for Primary Diagnosis: Unspecified mood (affective) disorder (HCC) Long Term Goal(s): Safe transition to appropriate next level of care at discharge, Engage patient in therapeutic group addressing interpersonal concerns.  Short Term Goals: Engage patient in aftercare planning with referrals and resources, Identify triggers associated with mental health/substance abuse issues and Increase skills for wellness and recovery  Therapeutic Interventions: Assess for all discharge needs, 1 to 1 time with Social worker, Explore available resources and support systems, Assess for adequacy in community support network, Educate family and significant other(s) on suicide prevention,  Complete Psychosocial Assessment, Interpersonal group therapy.  Evaluation of Outcomes: Progressing   Progress in Treatment: Attending groups: No. Participating in groups: No. Taking medication as prescribed: Yes. Toleration medication: Yes. Family/Significant other contact made: Yes, individual(s) contacted:  CSW has made contact with pt's guardian, Esmeralda Arthur with [employer]. Patient understands diagnosis: No. Discussing patient identified problems/goals with staff: Yes. Medical problems stabilized or resolved: Yes. Denies suicidal/homicidal ideation: Yes. Issues/concerns per patient self-inventory: No. Other:    New problem(s) identified: Yes, Describe:  Patient has been having a few behavioral issues on the unit.  New Short Term/Long Term Goal(s):  Patient Goals:  To be discharged  Discharge Plan or Barriers: TBD.  CSW, Pt.s guardian and Koren Shiver are continuing to explore appropriate residence options.  Pt may be more appropriate to go to an [facility] with CST.   Reason for Continuation of Hospitalization: Other; describe Need to identify and secure appropriate residential placement for discharge.  Estimated Length of Stay: 7 days  Recreational Therapy: Patient Stressors: N/A Patient Goal: Patient will engage in interactions with peers and staff in pro-social manner at least 2x within 5 recreation therapy group sessions  Attendees: Patient: 12/19/2017 10:27 AM  Physician: Corinna Gab, MD 12/19/2017 10:27 AM  Nursing: Milas Hock, RN 12/19/2017 10:27 AM  RN Care Manager: 12/19/2017 10:27 AM  Social  Worker: Joneen Roach, Theresia Majors 12/19/2017 10:27 AM  Recreational Therapist:  12/19/2017 10:27 AM  Other: Lowella Dandy, LCSW 12/19/2017 10:27 AM  Other: Huey Romans, LCSW 12/19/2017 10:27 AM  Other: 12/19/2017 10:27 AM    Scribe for Treatment Team: Johny Shears, LCSW 12/19/2017 10:27 AM

## 2017-12-19 NOTE — Progress Notes (Signed)
[facility] MD Progress Note  12/19/2017 10:13 AM Joanne Johnston  MRN:  [id number] Subjective:  Pt had a verbal outburst yesterday and was yelling in the hallway. She is is upset because she states that she had 3 bottles of body wash and they are missing. She willingly took oral Haldol and Ativan and calmed down. She is very calm this morning. She has no recollection of this yesterday. She states "i had a good day yesterday." She is willing to have labs drawn today. She is fully oriented today to person, city, state, president, month and year. She is caring well for her ADLs. She did take medciations this morning with no issue.Her father called me today to inquire about any updates. Discussed that we are continuing to look into family care homes for her. He also states that he has not heard anything from his new guardian and is upset about this. Discussed that we are not clear who her new guardian is yet and multiple people from [place] are working on her case.   Principal Problem: Unspecified mood (affective) disorder ([place]) Diagnosis:   Patient Active Problem List   Diagnosis Date Noted  . Unspecified mood (affective) disorder ([place]) [F39] 11/18/2017    Priority: High  . 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 Agitation [R45.1] 11/04/2017   Total Time spent with patient: 15 minutes  Past Psychiatric History: See H&p  Past Medical History:  Past Medical History:  Diagnosis Date  . Alcohol dependence ([place])   . Anxiety   . Dementia   . Depression   . Hypertension   . Tachycardia     Past Surgical History:  Procedure Laterality Date  . CESAREAN SECTION     x2   Family History: History reviewed. No pertinent family history. Family Psychiatric  History: See H&P Social History:  Social History   Substance and Sexual Activity  Alcohol Use Not Currently  . Frequency: Never   Comment: states hasn't had a drink in  months     Social History   Substance and Sexual Activity  Drug Use Never    Social History   Socioeconomic History  . Marital status: Divorced    Spouse name: Not on file  . Number of children: Not on file  . Years of education: Not on file  . Highest education level: Not on file  Occupational History  . Not on file  Social Needs  . Financial resource strain: Not on file  . Food insecurity:    Worry: Not on file    Inability: Not on file  . Transportation needs:    Medical: Not on file    Non-medical: Not on file  Tobacco Use  . Smoking status: Never Smoker  . Smokeless tobacco: Never Used  Substance and Sexual Activity  . Alcohol use: Not Currently    Frequency: Never    Comment: states hasn't had a drink in months  . Drug use: Never  . Sexual activity: Not Currently  Lifestyle  . Physical activity:    Days per week: Not on file    Minutes per session: Not on file  . Stress: Not on file  Relationships  . Social connections:    Talks on phone: Not on file    Gets together: Not on file    Attends religious service: Not on file    Active member of club or organization: Not on file  Attends meetings of clubs or organizations: Not on file    Relationship status: Not on file  Other Topics Concern  . Not on file  Social History Narrative  . Not on file   Additional Social History:                         Sleep: Good  Appetite:  Good  Current Medications: Current Facility-Administered Medications  Medication Dose Route Frequency Provider Last Rate Last Dose  . acetaminophen (TYLENOL) tablet 650 mg  650 mg Oral Q6H PRN Clapacs, John T, MD      . alum & mag hydroxide-simeth (MAALOX/MYLANTA) 200-200-20 MG/5ML suspension 30 mL  30 mL Oral Q4H PRN Clapacs, John T, MD      . benztropine (COGENTIN) tablet 1 mg  1 mg Oral Q6H PRN Lenward Chancellor, MD       Or  . benztropine mesylate (COGENTIN) injection 1 mg  1 mg Intramuscular Q6H PRN Lenward Chancellor, MD      . carbamazepine (TEGRETOL) tablet 200 mg  200 mg Oral BID Jeanpierre Thebeau R, MD   200 mg at 12/19/17 0858  . haloperidol (HALDOL) tablet 5 mg  5 mg Oral Q6H PRN Lenward Chancellor, MD   5 mg at 11/22/17 1646   Or  . haloperidol lactate (HALDOL) injection 5 mg  5 mg Intramuscular Q6H PRN Lenward Chancellor, MD   5 mg at 12/18/17 1403  . LORazepam (ATIVAN) injection 1 mg  1 mg Intramuscular Q6H PRN He, Jun, MD   1 mg at 11/22/17 0208  . LORazepam (ATIVAN) tablet 1 mg  1 mg Oral Q6H PRN Lenward Chancellor, MD   1 mg at 11/22/17 1646   Or  . LORazepam (ATIVAN) injection 1 mg  1 mg Intramuscular Q6H PRN Lenward Chancellor, MD   1 mg at 12/18/17 1407  . magnesium hydroxide (MILK OF MAGNESIA) suspension 30 mL  30 mL Oral Daily PRN Clapacs, John T, MD      . QUEtiapine (SEROQUEL) tablet 400 mg  400 mg Oral QHS Elijiah Mickley R, MD      . thiamine (VITAMIN B-1) tablet 100 mg  100 mg Oral Daily Lenward Chancellor, MD   100 mg at 12/19/17 0858  . traZODone (DESYREL) tablet 100 mg  100 mg Oral QHS PRN He, Jun, MD   100 mg at 12/15/17 2123    Lab Results: No results found for this or any previous visit (from the past 17 hour(s)).  Blood Alcohol level:  Lab Results  Component Value Date   ETH <10 86/57/8469    Metabolic Disorder Labs: No results found for: HGBA1C, MPG No results found for: PROLACTIN No results found for: CHOL, TRIG, HDL, CHOLHDL, VLDL, LDLCALC  Physical Findings: AIMS: Facial and Oral Movements Muscles of Facial Expression: None, normal Lips and Perioral Area: None, normal Jaw: None, normal Tongue: None, normal,Extremity Movements Upper (arms, wrists, hands, fingers): None, normal Lower (legs, knees, ankles, toes): None, normal, Trunk Movements Neck, shoulders, hips: None, normal, Overall Severity Severity of abnormal movements (highest score from questions above): None, normal Incapacitation due to abnormal movements: None, normal Patient's awareness of abnormal  movements (rate only patient's report): No Awareness, Dental Status Current problems with teeth and/or dentures?: No Does patient usually wear dentures?: No  CIWA:    COWS:     Musculoskeletal: Strength & Muscle Tone: within normal limits Gait & Station: normal Patient leans: N/A  Psychiatric Specialty Exam: Physical  Exam  Nursing note and vitals reviewed.   Review of Systems  All other systems reviewed and are negative.   Blood pressure 118/87, pulse 88, temperature 98.5 F (36.9 C), temperature source Oral, resp. rate 17, height _0  (1.727 m), weight 80.7 kg, SpO2 100 %.Body mass index is 27.06 kg/m.  General Appearance: Casual  Eye Contact:  Fair  Speech:  Clear and Coherent  Volume:  Normal  Mood:  Irritable  Affect:  Labile  Thought Process:  Coherent and Goal Directed  Orientation:  Other:  Fully oriented today  Thought Content:  Logical  Suicidal Thoughts:  No  Homicidal Thoughts:  No  Memory:  Immediate;   Poor  Judgement:  Impaired  Insight:  Lacking  Psychomotor Activity:  Normal  Concentration:  Concentration: Poor  Recall:  Poor  Fund of Knowledge:  Fair  Language:  Fair  Akathisia:  No      Assets:  Resilience  ADL's:  Intact  Cognition:  Impaired,  Moderate  Sleep:  Number of Hours: 8     Treatment Plan Summary: 47 yo female admitted due to behavioral disturbances at memory care unit. She has been doing very well on the unit with no outbursts in several weeks. However, yesterday had a verbal outburst about her body wash going missing. She accepted oral prn with no issues. She is calm today. Issues with short term memory are becoming more and more evident and does not recall this incident yesterday. The outburst was over an appropriate issue however, it was a bit exaggerated which usually happens with any sort of frontal lobe damage which is likely the cause of her verbal outbursts. She has not been violent and she has been very appropriate when she is  around peers. Placement is still an issue at this time.   Plan:  Mood -Increase Seroquel to 400 mg qhs. She was on 600 mg on admission but lowered due to sedation. Tegretol may be lowering levels of Seroquel -Continue Tegretol 200 mg BID. Will check level today. Consider increasing dose as my be autoinducing metabolism which would not require higher dose. Will need to get permission from guardian. Left message for Ms. Dalbert Batman [phone]. Also left message for Anabel Bene at [phone] to get consent. Will wait to see what level is today. Checking CMP to monitor sodium levels. Sodium 135.   Korsakoff's syndrome -Neurology was already consulted while in hospital in [facility] and workup completed including MRI, CT head, RPR. 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 She also has long history of heavy alcohol abuse and poor dietary intake. Her physical symptoms are improved significant since then and only persistent symptoms are short term memory loss ----MOCA on 9/27/19was 21/30 -PT evaluated and felt she was now independent with cares. -OT recommends supervised living due to poor short term memory issues and help with tracking day of the week and day to day management skills. This could be accomplished in an assisted living or groups home living situation  Dispo -We had care conference thsi week to discuss options for patient. Guardians will attempt to get consents signed so we can reach out to [facility] to see if she will qualify. They are still hesitant for this level of care for unclear reasons but does not meet criteria for any other level of care. CSW has been calling several [place] to inquire about availability. I spoke with her father today to give update on plan.  Labs done today including CBC, CMP, Tegretol Level. CBC wnl.  Joanne Crosby, MD 12/19/2017, 10:13 AM

## 2017-12-19 NOTE — [facility] Group Notes (Signed)

## 2017-12-20 NOTE — Plan of Care (Signed)
Patient oriented to unit. Patient's safety is maintained on unit. Patient denies SI/HI/AVH.  Patient is minimal during assessment. Patient has no complaints at this time. Patient is in milieu.   Problem: Coping: Goal: Ability to use eye contact when communicating with others will improve Outcome: Progressing

## 2017-12-20 NOTE — Progress Notes (Signed)
[facility] MD Progress Note  12/20/2017 2:34 PM Joanne Johnston  MRN:  [id number] Subjective: Follow-up for this patient with chronic memory problems.  Patient has no new complaints.  Patient has been calm without major outbursts or behavior problems.  Still frustrated at being in the hospital but usually easily redirectable and she is interacting well with peers.  No new physical complaints.  Tolerating medicine well. Principal Problem: Unspecified mood (affective) disorder ([place]) Diagnosis:   Patient Active Problem List   Diagnosis Date Noted  . 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 Unspecified mood (affective) disorder ([place]) [F39] 11/18/2017  . Agitation [R45.1] 11/04/2017   Total Time spent with patient: 20 minutes  Past Psychiatric History: History of alcohol abuse and dementia  Past Medical History:  Past Medical History:  Diagnosis Date  . Alcohol dependence ([place])   . Anxiety   . Dementia   . Depression   . Hypertension   . Tachycardia     Past Surgical History:  Procedure Laterality Date  . CESAREAN SECTION     x2   Family History: History reviewed. No pertinent family history. Family Psychiatric  History: None known Social History:  Social History   Substance and Sexual Activity  Alcohol Use Not Currently  . Frequency: Never   Comment: states hasn't had a drink in months     Social History   Substance and Sexual Activity  Drug Use Never    Social History   Socioeconomic History  . Marital status: Divorced    Spouse name: Not on file  . Number of children: Not on file  . Years of education: Not on file  . Highest education level: Not on file  Occupational History  . Not on file  Social Needs  . Financial resource strain: Not on file  . Food insecurity:    Worry: Not on file    Inability: Not on file  . Transportation needs:    Medical: Not on file    Non-medical: Not on file   Tobacco Use  . Smoking status: Never Smoker  . Smokeless tobacco: Never Used  Substance and Sexual Activity  . Alcohol use: Not Currently    Frequency: Never    Comment: states hasn't had a drink in months  . Drug use: Never  . Sexual activity: Not Currently  Lifestyle  . Physical activity:    Days per week: Not on file    Minutes per session: Not on file  . Stress: Not on file  Relationships  . Social connections:    Talks on phone: Not on file    Gets together: Not on file    Attends religious service: Not on file    Active member of club or organization: Not on file    Attends meetings of clubs or organizations: Not on file    Relationship status: Not on file  Other Topics Concern  . Not on file  Social History Narrative  . Not on file   Additional Social History:                         Sleep: Fair  Appetite:  Fair  Current Medications: Current Facility-Administered Medications  Medication Dose Route Frequency Provider Last Rate Last Dose  . acetaminophen (TYLENOL) tablet 650 mg  650 mg Oral Q6H PRN Fred Franzen, Madie Reno, MD      . alum &  mag hydroxide-simeth (MAALOX/MYLANTA) 200-200-20 MG/5ML suspension 30 mL  30 mL Oral Q4H PRN Mertie Haslem T, MD      . benztropine (COGENTIN) tablet 1 mg  1 mg Oral Q6H PRN Lenward Chancellor, MD       Or  . benztropine mesylate (COGENTIN) injection 1 mg  1 mg Intramuscular Q6H PRN Lenward Chancellor, MD      . carbamazepine (TEGRETOL) tablet 200 mg  200 mg Oral BID McNew, Holly R, MD   200 mg at 12/20/17 1032  . haloperidol (HALDOL) tablet 5 mg  5 mg Oral Q6H PRN Lenward Chancellor, MD   5 mg at 11/22/17 1646   Or  . haloperidol lactate (HALDOL) injection 5 mg  5 mg Intramuscular Q6H PRN Lenward Chancellor, MD   5 mg at 12/18/17 1403  . LORazepam (ATIVAN) injection 1 mg  1 mg Intramuscular Q6H PRN He, Jun, MD   1 mg at 11/22/17 0208  . LORazepam (ATIVAN) tablet 1 mg  1 mg Oral Q6H PRN Lenward Chancellor, MD   1 mg at 11/22/17  1646   Or  . LORazepam (ATIVAN) injection 1 mg  1 mg Intramuscular Q6H PRN Lenward Chancellor, MD   1 mg at 12/18/17 1407  . magnesium hydroxide (MILK OF MAGNESIA) suspension 30 mL  30 mL Oral Daily PRN Clint Biello T, MD      . QUEtiapine (SEROQUEL) tablet 400 mg  400 mg Oral QHS McNew, Tyson Babinski, MD   400 mg at 12/19/17 2114  . thiamine (VITAMIN B-1) tablet 100 mg  100 mg Oral Daily Lenward Chancellor, MD   100 mg at 12/20/17 1032  . traZODone (DESYREL) tablet 100 mg  100 mg Oral QHS PRN He, Jun, MD   100 mg at 12/15/17 2123    Lab Results:  Results for orders placed or performed during the hospital encounter of 11/14/17 (from the past 48 hour(s))  Carbamazepine level, total     Status: Abnormal   Collection Time: 12/19/17 10:42 AM  Result Value Ref Range   Carbamazepine Lvl 3.0 (L) 4.0 - 12.0 ug/mL    Comment: Performed at [facility], [address] [place], [place] [address]  CBC with Differential/Platelet     Status: None   Collection Time: 12/19/17 10:42 AM  Result Value Ref Range   WBC 5.0 4.0 - 10.5 K/uL   RBC 4.75 3.87 - 5.11 MIL/uL   Hemoglobin 13.1 12.0 - 15.0 g/dL   HCT 40.0 36.0 - 46.0 %   MCV 84.2 80.0 - 100.0 fL   MCH 27.6 26.0 - 34.0 pg   MCHC 32.8 30.0 - 36.0 g/dL   RDW 12.2 11.5 - 15.5 %   Platelets 242 150 - 400 K/uL   nRBC 0.0 0.0 - 0.2 %   Neutrophils Relative % 72 %   Neutro Abs 3.6 1.7 - 7.7 K/uL   Lymphocytes Relative 19 %   Lymphs Abs 0.9 0.7 - 4.0 K/uL   Monocytes Relative 7 %   Monocytes Absolute 0.3 0.1 - 1.0 K/uL   Eosinophils Relative 2 %   Eosinophils Absolute 0.1 0.0 - 0.5 K/uL   Basophils Relative 0 %   Basophils Absolute 0.0 0.0 - 0.1 K/uL   Immature Granulocytes 0 %   Abs Immature Granulocytes 0.01 0.00 - 0.07 K/uL    Comment: Performed at [facility], [address] [place], [place] [address]  Comprehensive metabolic panel     Status: Abnormal   Collection Time:  12/19/17 10:42 AM  Result Value Ref Range   Sodium  135 135 - 145 mmol/L   Potassium 4.0 3.5 - 5.1 mmol/L   Chloride 99 98 - 111 mmol/L   CO2 27 22 - 32 mmol/L   Glucose, Bld 133 (H) 70 - 99 mg/dL   BUN 10 6 - 20 mg/dL   Creatinine, Ser 0.80 0.44 - 1.00 mg/dL   Calcium 9.1 8.9 - 10.3 mg/dL   Total Protein 7.3 6.5 - 8.1 g/dL   Albumin 4.3 3.5 - 5.0 g/dL   AST 35 15 - 41 U/L   ALT 22 0 - 44 U/L   Alkaline Phosphatase 81 38 - 126 U/L   Total Bilirubin 0.6 0.3 - 1.2 mg/dL   GFR calc non Af Amer >60 >60 mL/min   GFR calc Af Amer >60 >60 mL/min    Comment: (NOTE) The eGFR has been calculated using the CKD EPI equation. This calculation has not been validated in all clinical situations. eGFR's persistently <60 mL/min signify possible Chronic Kidney Disease.    Anion gap 9 5 - 15    Comment: Performed at [facility], [address] [place], [place] [address]    Blood Alcohol level:  Lab Results  Component Value Date   [facility] <10 21/30/8657    Metabolic Disorder Labs: No results found for: HGBA1C, MPG No results found for: PROLACTIN No results found for: CHOL, TRIG, HDL, CHOLHDL, VLDL, LDLCALC  Physical Findings: AIMS: Facial and Oral Movements Muscles of Facial Expression: None, normal Lips and Perioral Area: None, normal Jaw: None, normal Tongue: None, normal,Extremity Movements Upper (arms, wrists, hands, fingers): None, normal Lower (legs, knees, ankles, toes): None, normal, Trunk Movements Neck, shoulders, hips: None, normal, Overall Severity Severity of abnormal movements (highest score from questions above): None, normal Incapacitation due to abnormal movements: None, normal Patient's awareness of abnormal movements (rate only patient's report): No Awareness, Dental Status Current problems with teeth and/or dentures?: No Does patient usually wear dentures?: No  CIWA:    COWS:     Musculoskeletal: Strength & Muscle Tone: within normal limits Gait & Station: normal Patient leans: N/A  Psychiatric  Specialty Exam: Physical Exam  Nursing note and vitals reviewed. Constitutional: She appears well-developed and well-nourished.  HENT:  Head: Normocephalic and atraumatic.  Eyes: Pupils are equal, round, and reactive to light. Conjunctivae are normal.  Neck: Normal range of motion.  Cardiovascular: Regular rhythm and normal heart sounds.  Respiratory: Effort normal.  GI: Soft.  Musculoskeletal: Normal range of motion.  Neurological: She is alert.  Skin: Skin is warm and dry.  Psychiatric: Her behavior is normal. Judgment and thought content normal. Her affect is blunt. Her speech is delayed. Cognition and memory are normal.    Review of Systems  Constitutional: Negative.   HENT: Negative.   Eyes: Negative.   Respiratory: Negative.   Cardiovascular: Negative.   Gastrointestinal: Negative.   Musculoskeletal: Negative.   Skin: Negative.   Neurological: Negative.   Psychiatric/Behavioral: Negative.     Blood pressure 118/87, pulse 88, temperature 98.5 F (36.9 C), temperature source Oral, resp. rate 17, height '5\' 8"'  (1.727 m), weight 80.7 kg, SpO2 100 %.Body mass index is 27.06 kg/m.  General Appearance: Casual  Eye Contact:  Fair  Speech:  Normal Rate  Volume:  Normal  Mood:  Euthymic  Affect:  Constricted  Thought Process:  Goal Directed  Orientation:  Full (Time, Place, and Person)  Thought Content:  Logical  Suicidal Thoughts:  No  Homicidal Thoughts:  No  Memory:  Immediate;   Fair Recent;   Poor Remote;   Poor  Judgement:  Fair  Insight:  Fair  Psychomotor Activity:  Decreased  Concentration:  Concentration: Fair  Recall:  [employer] of Knowledge:  Fair  Language:  Fair  Akathisia:  No  Handed:  Right  AIMS (if indicated):     Assets:  Desire for Improvement  ADL's:  Intact  Cognition:  Impaired,  Mild  Sleep:  Number of Hours: 7.45     Treatment Plan Summary: Plan Patient appears to be stable with her behavior under pretty good control.  Staff on the  unit have gotten used to her and she has gotten used to them.  No indication to make any change to medicine at this point.  Encouragement offered and support.  Alethia Berthold, MD 12/20/2017, 2:34 PM

## 2017-12-20 NOTE — Plan of Care (Signed)
Patient contact for safety and denies any SI/HI and AVH, seeing in common area, pleasant socializing and watching television with peers with out any issues, has been compliant medications. Organized in thoughts , no episodes of agitation or violence , minimal depression 4/10 expressed by patient, sleep is continuous, patient voice no concerns. 15 minute rounding is maintained.   Problem: Coping: Goal: Ability to identify and develop effective coping behavior will improve Outcome: Progressing Goal: Ability to interact with others will improve Outcome: Progressing Goal: Demonstration of participation in decision-making regarding own care will improve Outcome: Progressing Goal: Ability to use eye contact when communicating with others will improve Outcome: Progressing   Problem: Health Behavior/Discharge Planning: Goal: Identification of resources available to assist in meeting health care needs will improve Outcome: Progressing

## 2017-12-20 NOTE — [facility] Group Notes (Signed)
LCSW Group Therapy Note   12/20/2017 1:15pm   Type of Therapy and Topic:  Group Therapy:  Trust and Honesty  Participation Level:  Active  Description of Group:    In this group patients will be asked to explore the value of being honest.  Patients will be guided to discuss their thoughts, feelings, and behaviors related to honesty and trusting in others. Patients will process together how trust and honesty relate to forming relationships with peers, family members, and self. Each patient will be challenged to identify and express feelings of being vulnerable. Patients will discuss reasons why people are dishonest and identify alternative outcomes if one was truthful (to self or others). This group will be process-oriented, with patients participating in exploration of their own experiences, giving and receiving support, and processing challenge from other group members.   Therapeutic Goals: 1. Patient will identify why honesty is important to relationships and how honesty overall affects relationships.  2. Patient will identify a situation where they lied or were lied too and the  feelings, thought process, and behaviors surrounding the situation 3. Patient will identify the meaning of being vulnerable, how that feels, and how that correlates to being honest with self and others. 4. Patient will identify situations where they could have told the truth, but instead lied and explain reasons of dishonesty.   Summary of Patient Progress:The patient reported that she feels "okay." The patient was able to explore the value of being honest.  Patient discussed thoughts, feelings, and behaviors related to honesty and trusting in others. The patient processed together with other group members how trust and honesty relate to forming relationships with peers, family members, and self. Pt actively and appropriately engaged in the group. Patient was able to provide support and validation to other group members.  Patient practiced active listening when interacting with the facilitator and other group members.    Therapeutic Modalities:   Cognitive Behavioral Therapy Solution Focused Therapy Motivational Interviewing Brief Therapy  Sheryl Towell  [name], LCSW 12/20/2017 11:27 AM

## 2017-12-21 MED ORDER — CARBAMAZEPINE 200 MG PO TABS
200.0000 mg | ORAL_TABLET | Freq: Three times a day (TID) | ORAL | Status: DC
  Administered 2017-12-21 – 2017-12-22 (×2): 200 mg via ORAL
  Filled 2017-12-21: qty 1

## 2017-12-21 NOTE — Plan of Care (Signed)
Patient has the ability to identify effective coping behaviors and has been observed interacting staff and other members on the unit without any issues. Patient has been participating in the decision-making process regarding her health-care. Patient uses fair eye contact when communicating with this Clinical research associate. Patient remains safe on the unit at this time.  Problem: Coping: Goal: Ability to identify and develop effective coping behavior will improve Outcome: Progressing Goal: Ability to interact with others will improve Outcome: Progressing Goal: Demonstration of participation in decision-making regarding own care will improve Outcome: Progressing Goal: Ability to use eye contact when communicating with others will improve Outcome: Progressing   Problem: Health Behavior/Discharge Planning: Goal: Identification of resources available to assist in meeting health care needs will improve Outcome: Progressing

## 2017-12-21 NOTE — Plan of Care (Signed)
Patient was in the day room and was interacting appropriately with peers and staff. Patient made fair eye contact with this writer up assessment and medication administration.   Problem: Coping: Goal: Ability to interact with others will improve Outcome: Progressing Goal: Ability to use eye contact when communicating with others will improve Outcome: Progressing

## 2017-12-21 NOTE — Progress Notes (Signed)
[facility] MD Progress Note  12/21/2017 3:04 PM Joanne Johnston  MRN:  [id number] Subjective: Follow-up for this patient with mood symptoms and persisting memory problems.  Patient has no new complaints.  Denies depressive symptoms.  Denies suicidal or homicidal ideation.  Mostly takes care of her ADLs okay and participates in activities.  Gets along fine with others.  Has not been showing any aggression recently.  Labs show that her Tegretol level was only 3 which is on the low side. Principal Problem: Unspecified mood (affective) disorder ([facility]) Diagnosis:   Patient Active Problem List   Diagnosis Date Noted  . 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 Unspecified mood (affective) disorder ([facility]) [F39] 11/18/2017  . Agitation [R45.1] 11/04/2017   Total Time spent with patient: 20 minutes  Past Psychiatric History: Patient has a history of memory disturbance which appears to be related to chronic alcohol use.  She has a legal guardian now.  Past Medical History:  Past Medical History:  Diagnosis Date  . Alcohol dependence ([facility])   . Anxiety   . Dementia   . Depression   . Hypertension   . Tachycardia     Past Surgical History:  Procedure Laterality Date  . CESAREAN SECTION     x2   Family History: History reviewed. No pertinent family history. Family Psychiatric  History: None known Social History:  Social History   Substance and Sexual Activity  Alcohol Use Not Currently  . Frequency: Never   Comment: states hasn't had a drink in months     Social History   Substance and Sexual Activity  Drug Use Never    Social History   Socioeconomic History  . Marital status: Divorced    Spouse name: Not on file  . Number of children: Not on file  . Years of education: Not on file  . Highest education level: Not on file  Occupational History  . Not on file  Social Needs  . Financial resource strain: Not on  file  . Food insecurity:    Worry: Not on file    Inability: Not on file  . Transportation needs:    Medical: Not on file    Non-medical: Not on file  Tobacco Use  . Smoking status: Never Smoker  . Smokeless tobacco: Never Used  Substance and Sexual Activity  . Alcohol use: Not Currently    Frequency: Never    Comment: states hasn't had a drink in months  . Drug use: Never  . Sexual activity: Not Currently  Lifestyle  . Physical activity:    Days per week: Not on file    Minutes per session: Not on file  . Stress: Not on file  Relationships  . Social connections:    Talks on phone: Not on file    Gets together: Not on file    Attends religious service: Not on file    Active member of club or organization: Not on file    Attends meetings of clubs or organizations: Not on file    Relationship status: Not on file  Other Topics Concern  . Not on file  Social History Narrative  . Not on file   Additional Social History:                         Sleep: Fair  Appetite:  Fair  Current Medications: Current Facility-Administered Medications  Medication  Dose Route Frequency Provider Last Rate Last Dose  . acetaminophen (TYLENOL) tablet 650 mg  650 mg Oral Q6H PRN Tondra Reierson T, MD      . alum & mag hydroxide-simeth (MAALOX/MYLANTA) 200-200-20 MG/5ML suspension 30 mL  30 mL Oral Q4H PRN Duong Haydel T, MD      . benztropine (COGENTIN) tablet 1 mg  1 mg Oral Q6H PRN Beverly Sessions, MD       Or  . benztropine mesylate (COGENTIN) injection 1 mg  1 mg Intramuscular Q6H PRN Beverly Sessions, MD      . carbamazepine (TEGRETOL) tablet 200 mg  200 mg Oral TID Mclean Moya T, MD      . haloperidol (HALDOL) tablet 5 mg  5 mg Oral Q6H PRN Beverly Sessions, MD   5 mg at 11/22/17 1646   Or  . haloperidol lactate (HALDOL) injection 5 mg  5 mg Intramuscular Q6H PRN Beverly Sessions, MD   5 mg at 12/18/17 1403  . LORazepam (ATIVAN) injection 1 mg  1 mg Intramuscular Q6H  PRN He, Jun, MD   1 mg at 11/22/17 0208  . LORazepam (ATIVAN) tablet 1 mg  1 mg Oral Q6H PRN Beverly Sessions, MD   1 mg at 11/22/17 1646   Or  . LORazepam (ATIVAN) injection 1 mg  1 mg Intramuscular Q6H PRN Beverly Sessions, MD   1 mg at 12/18/17 1407  . magnesium hydroxide (MILK OF MAGNESIA) suspension 30 mL  30 mL Oral Daily PRN Xzayvier Fagin T, MD      . QUEtiapine (SEROQUEL) tablet 400 mg  400 mg Oral QHS McNew, Ileene Hutchinson, MD   400 mg at 12/20/17 2129  . thiamine (VITAMIN B-1) tablet 100 mg  100 mg Oral Daily Beverly Sessions, MD   100 mg at 12/20/17 1032  . traZODone (DESYREL) tablet 100 mg  100 mg Oral QHS PRN He, Jun, MD   100 mg at 12/20/17 2129    Lab Results: No results found for this or any previous visit (from the past 48 hour(s)).  Blood Alcohol level:  Lab Results  Component Value Date   ETH <10 11/04/2017    Metabolic Disorder Labs: No results found for: HGBA1C, MPG No results found for: PROLACTIN No results found for: CHOL, TRIG, HDL, CHOLHDL, VLDL, LDLCALC  Physical Findings: AIMS: Facial and Oral Movements Muscles of Facial Expression: None, normal Lips and Perioral Area: None, normal Jaw: None, normal Tongue: None, normal,Extremity Movements Upper (arms, wrists, hands, fingers): None, normal Lower (legs, knees, ankles, toes): None, normal, Trunk Movements Neck, shoulders, hips: None, normal, Overall Severity Severity of abnormal movements (highest score from questions above): None, normal Incapacitation due to abnormal movements: None, normal Patient's awareness of abnormal movements (rate only patient's report): No Awareness, Dental Status Current problems with teeth and/or dentures?: No Does patient usually wear dentures?: No  CIWA:    COWS:     Musculoskeletal: Strength & Muscle Tone: within normal limits Gait & Station: normal Patient leans: N/A  Psychiatric Specialty Exam: Physical Exam  Nursing note and vitals reviewed. Constitutional: She  appears well-developed and well-nourished.  HENT:  Head: Normocephalic and atraumatic.  Eyes: Pupils are equal, round, and reactive to light. Conjunctivae are normal.  Neck: Normal range of motion.  Cardiovascular: Regular rhythm and normal heart sounds.  Respiratory: Effort normal. No respiratory distress.  GI: Soft.  Musculoskeletal: Normal range of motion.  Neurological: She is alert.  Skin: Skin is warm and dry.  Psychiatric: Her  affect is blunt. Her speech is delayed. She is slowed. Thought content is not paranoid. She expresses impulsivity. She expresses no homicidal and no suicidal ideation. She exhibits abnormal recent memory.    Review of Systems  Constitutional: Negative.   HENT: Negative.   Eyes: Negative.   Respiratory: Negative.   Cardiovascular: Negative.   Gastrointestinal: Negative.   Musculoskeletal: Negative.   Skin: Negative.   Neurological: Negative.   Psychiatric/Behavioral: Positive for memory loss. Negative for depression, hallucinations, substance abuse and suicidal ideas. The patient is not nervous/anxious and does not have insomnia.     Blood pressure 118/87, pulse 88, temperature 98.5 F (36.9 C), temperature source Oral, resp. rate 17, height 5\' 8"  (1.727 m), weight 80.7 kg, SpO2 100 %.Body mass index is 27.06 kg/m.  General Appearance: Casual  Eye Contact:  Fair  Speech:  Slow  Volume:  Decreased  Mood:  Euthymic  Affect:  Constricted  Thought Process:  Goal Directed  Orientation:  Full (Time, Place, and Person)  Thought Content:  Logical  Suicidal Thoughts:  No  Homicidal Thoughts:  No  Memory:  Immediate;   Fair Recent;   Fair Remote;   Fair  Judgement:  Fair  Insight:  Fair  Psychomotor Activity:  Decreased  Concentration:  Concentration: Poor  Recall:  Poor  Fund of Knowledge:  Fair  Language:  Fair  Akathisia:  No  Handed:  Right  AIMS (if indicated):     Assets:  Desire for Improvement Social Support  ADL's:  Intact  Cognition:   Impaired,  Mild  Sleep:  Number of Hours: 6.75     Treatment Plan Summary: Daily contact with patient to assess and evaluate symptoms and progress in treatment, Medication management and Plan Patient with chronic memory impairment his discharge plan is being held up by some problems with her guardian being agreeable to appropriate discharge plan.  Tegretol seems to have been of some help and is on the low side so I will increase the dose to 3 times a day.  No other change to medicine.  Supportive counseling.  Mordecai Rasmussen, MD 12/21/2017, 3:04 PM

## 2017-12-21 NOTE — Plan of Care (Signed)
  Problem: Coping: Goal: Ability to identify and develop effective coping behavior will improve Outcome: Progressing  Patient coping effectively on the unit appears less anxious, interacting apptopriately with peers.

## 2017-12-21 NOTE — Progress Notes (Signed)
Patient is alert and oriented x 4, denies SI/HI/AVH denies pain or discomfort no distress noted, affect is brighter, less irritable interacting appropriately with peers and staff, very polite and cheerful, making good eye contact with witer and receptive to treatment care.  Patient is complaint with medication, 15 minutes safety checks maintained will continue to monitor.

## 2017-12-21 NOTE — Progress Notes (Signed)
D- Patient alert and oriented. Patient presents in a pleasant mood on assessment reporting that she slept fair last night and had no major complaints to voice to this Clinical research associate. Patient denies SI, HI, AVH, and pain at this time. Patient rated both her depression/anxiety a "5/10" on her self-inventory, however, she denied these symptoms to this Clinical research associate. Patient's goal for today, according to her self-inventory, is "dismissal", in which she will "follow directions" in order to accomplish this goal.  A- Scheduled medications administered to patient, per MD orders. Support and encouragement provided.  Routine safety checks conducted every 15 minutes.  Patient informed to notify staff with problems or concerns.  R- No adverse drug reactions noted. Patient contracts for safety at this time. Patient compliant with medications and treatment plan. Patient receptive, calm, and cooperative. Patient interacts well with others on the unit.  Patient remains safe at this time.

## 2017-12-21 NOTE — Progress Notes (Signed)
This Clinical research associate informed patient that her medication, Tegretol, has been switched from 200mg  2x/day to 3x/day and patient stated that she doesn't feel like she needs it three times a day.

## 2017-12-21 NOTE — [facility] Group Notes (Signed)
LCSW Group Therapy Note 12/21/2017 1:15pm  Type of Therapy and Topic: Group Therapy: Feelings Around Returning Home & Establishing a Supportive Framework and Supporting Oneself When Supports Not Available  Participation Level: Did Not Attend  Description of Group:  Patients first processed thoughts and feelings about upcoming discharge. These included fears of upcoming changes, lack of change, new living environments, judgements and expectations from others and overall stigma of mental health issues. The group then discussed the definition of a supportive framework, what that looks and feels like, and how do to discern it from an unhealthy non-supportive network. The group identified different types of supports as well as what to do when your family/friends are less than helpful or unavailable  Therapeutic Goals  1. Patient will identify one healthy supportive network that they can use at discharge. 2. Patient will identify one factor of a supportive framework and how to tell it from an unhealthy network. 3. Patient able to identify one coping skill to use when they do not have positive supports from others. 4. Patient will demonstrate ability to communicate their needs through discussion and/or role plays.  Summary of Patient Progress:  Pt was invited to attend group but chose not to attend. CSW will continue to encourage pt to attend group throughout their admission.   Therapeutic Modalities Cognitive Behavioral Therapy Motivational Interviewing   Truman Aceituno  [name], LCSW 12/21/2017 10:36 AM 

## 2017-12-22 MED ORDER — CARBAMAZEPINE 200 MG PO TABS
300.0000 mg | ORAL_TABLET | Freq: Two times a day (BID) | ORAL | Status: DC
  Administered 2017-12-22 – 2017-12-29 (×9): 300 mg via ORAL
  Filled 2017-12-22: qty 1.5
  Filled 2017-12-22: qty 2
  Filled 2017-12-22 (×2): qty 1.5
  Filled 2017-12-22 (×2): qty 2
  Filled 2017-12-22 (×7): qty 1.5
  Filled 2017-12-22: qty 2
  Filled 2017-12-22: qty 1.5
  Filled 2017-12-22 (×3): qty 2
  Filled 2017-12-22: qty 1.5
  Filled 2017-12-22 (×2): qty 2
  Filled 2017-12-22 (×3): qty 1.5

## 2017-12-22 NOTE — Progress Notes (Signed)
[facility] MD Progress Note  12/22/2017 2:46 PM Joanne Johnston  MRN:  [id number] Subjective:  Pt did not have any outbursts over the weekend. This morning had a very brief episode of yelling in her room but resolved on her own rapidly. She was then on the phone smiling and laughing. She reports feeling well today. She is happy and hoping to get out of the hospital soon. She is caring well for her ADLs. She is fully oriented today. Tegretol was increased over the weekend to TID.   Principal Problem: Unspecified mood (affective) disorder ([place]) Diagnosis:   Patient Active Problem List   Diagnosis Date Noted  . Unspecified mood (affective) disorder ([place]) [F39] 11/18/2017    Priority: High  . 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 Korsakoff syndrome ([place]) [L38] 12/04/2017  . Agitation [R45.1] 11/04/2017   Total Time spent with patient: 15 minutes  Past Psychiatric History: See H&P  Past Medical History:  Past Medical History:  Diagnosis Date  . Alcohol dependence ([place])   . Anxiety   . Dementia   . Depression   . Hypertension   . Tachycardia     Past Surgical History:  Procedure Laterality Date  . CESAREAN SECTION     x2   Family History: History reviewed. No pertinent family history. Family Psychiatric  History: See H&P Social History:  Social History   Substance and Sexual Activity  Alcohol Use Not Currently  . Frequency: Never   Comment: states hasn't had a drink in months     Social History   Substance and Sexual Activity  Drug Use Never    Social History   Socioeconomic History  . Marital status: Divorced    Spouse name: Not on file  . Number of children: Not on file  . Years of education: Not on file  . Highest education level: Not on file  Occupational History  . Not on file  Social Needs  . Financial resource strain: Not on file  . Food insecurity:    Worry: Not on file    Inability: Not on file  . Transportation needs:   Medical: Not on file    Non-medical: Not on file  Tobacco Use  . Smoking status: Never Smoker  . Smokeless tobacco: Never Used  Substance and Sexual Activity  . Alcohol use: Not Currently    Frequency: Never    Comment: states hasn't had a drink in months  . Drug use: Never  . Sexual activity: Not Currently  Lifestyle  . Physical activity:    Days per week: Not on file    Minutes per session: Not on file  . Stress: Not on file  Relationships  . Social connections:    Talks on phone: Not on file    Gets together: Not on file    Attends religious service: Not on file    Active member of club or organization: Not on file    Attends meetings of clubs or organizations: Not on file    Relationship status: Not on file  Other Topics Concern  . Not on file  Social History Narrative  . Not on file   Additional Social History:                         Sleep: Good  Appetite:  Good  Current Medications: Current Facility-Administered Medications  Medication Dose Route Frequency Provider Last Rate Last Dose  . acetaminophen (  TYLENOL) tablet 650 mg  650 mg Oral Q6H PRN Clapacs, John T, MD      . alum & mag hydroxide-simeth (MAALOX/MYLANTA) 200-200-20 MG/5ML suspension 30 mL  30 mL Oral Q4H PRN Clapacs, John T, MD      . benztropine (COGENTIN) tablet 1 mg  1 mg Oral Q6H PRN Lenward Chancellor, MD       Or  . benztropine mesylate (COGENTIN) injection 1 mg  1 mg Intramuscular Q6H PRN Lenward Chancellor, MD      . carbamazepine (TEGRETOL) tablet 300 mg  300 mg Oral BID Suzi Hernan R, MD      . haloperidol (HALDOL) tablet 5 mg  5 mg Oral Q6H PRN Lenward Chancellor, MD   5 mg at 11/22/17 1646   Or  . haloperidol lactate (HALDOL) injection 5 mg  5 mg Intramuscular Q6H PRN Lenward Chancellor, MD   5 mg at 12/18/17 1403  . LORazepam (ATIVAN) injection 1 mg  1 mg Intramuscular Q6H PRN He, Jun, MD   1 mg at 11/22/17 0208  . LORazepam (ATIVAN) tablet 1 mg  1 mg Oral Q6H PRN Lenward Chancellor, MD   1 mg at 11/22/17 1646   Or  . LORazepam (ATIVAN) injection 1 mg  1 mg Intramuscular Q6H PRN Lenward Chancellor, MD   1 mg at 12/18/17 1407  . magnesium hydroxide (MILK OF MAGNESIA) suspension 30 mL  30 mL Oral Daily PRN Clapacs, John T, MD      . QUEtiapine (SEROQUEL) tablet 400 mg  400 mg Oral QHS Deshundra Waller, Tyson Babinski, MD   400 mg at 12/21/17 2124  . thiamine (VITAMIN B-1) tablet 100 mg  100 mg Oral Daily Lenward Chancellor, MD   100 mg at 12/21/17 1730  . traZODone (DESYREL) tablet 100 mg  100 mg Oral QHS PRN He, Jun, MD   100 mg at 12/20/17 2129    Lab Results: No results found for this or any previous visit (from the past 48 hour(s)).  Blood Alcohol level:  Lab Results  Component Value Date   ETH <10 08/18/7626    Metabolic Disorder Labs: No results found for: HGBA1C, MPG No results found for: PROLACTIN No results found for: CHOL, TRIG, HDL, CHOLHDL, VLDL, LDLCALC  Physical Findings: AIMS: Facial and Oral Movements Muscles of Facial Expression: None, normal Lips and Perioral Area: None, normal Jaw: None, normal Tongue: None, normal,Extremity Movements Upper (arms, wrists, hands, fingers): None, normal Lower (legs, knees, ankles, toes): None, normal, Trunk Movements Neck, shoulders, hips: None, normal, Overall Severity Severity of abnormal movements (highest score from questions above): None, normal Incapacitation due to abnormal movements: None, normal Patient's awareness of abnormal movements (rate only patient's report): No Awareness, Dental Status Current problems with teeth and/or dentures?: No Does patient usually wear dentures?: No  CIWA:    COWS:     Musculoskeletal: Strength & Muscle Tone: within normal limits Gait & Station: normal Patient leans: N/A  Psychiatric Specialty Exam: Physical Exam  Nursing note and vitals reviewed.   Review of Systems  All other systems reviewed and are negative.   Blood pressure 115/80, pulse 96, temperature 98.7 F  (37.1 C), temperature source Oral, resp. rate 16, height '5\' 8"'  (1.727 m), weight 80.7 kg, SpO2 100 %.Body mass index is 27.06 kg/m.  General Appearance: Casual  Eye Contact:  Good  Speech:  Clear and Coherent  Volume:  Normal  Mood:  Euthymic  Affect:  Congruent  Thought Process:  Coherent and Goal Directed  Orientation:  Full (Time, Place, and Person)  Thought Content:  Logical  Suicidal Thoughts:  No  Homicidal Thoughts:  No  Memory:  Immediate;   Poor  Judgement:  Impaired  Insight:  Lacking  Psychomotor Activity:  Normal  Concentration:  Concentration: Poor  Recall:  Poor  Fund of Knowledge:  Fair  Language:  Fair  Akathisia:  No      Assets:  Resilience  ADL's:  Intact  Cognition:  WNL  Sleep:  Number of Hours: 7.25     Treatment Plan Summary: 47 yo female admitted due to behavioral issues at memory care unit. She had brief verbal outbursts in her room but calmed herself down with no need for prns. Tegretol was increased to TID over the weekend by weekend provider. She is hesitant to take medications especially TID so will consolidate dose to BID today. Still waiting placement.   Mood -Continue Seroquel 400 mg qhs. -Tegretol level very low at 3.0 likely due to autoinduction of metabolism. This was increased to 200 mg TID. Will consolidate to 300 mg BID  Korsakoff's syndrome -Neurology was already consulted while in hospital in [facility] and workup completed including MRI, CT head, RPR. 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 She also has long history of heavy alcohol abuse and poor dietary intake. Her physical symptoms are improved significant since then and only persistent symptoms are short term memory loss ----MOCA on 9/27/19was 21/30 -PT evaluated and felt she was now independent with cares. -OT recommends supervised living due to poor short term memory issues and help with tracking day of the week  and day to day management skills. This could be accomplished in an assisted living or groups home living situation  Dispo -We had care conference this week to discuss options for patient. Guardians will attempt to get consents signed so we can reach out to [facility] to see if she will qualify. They are still hesitant for this level of care for unclear reasonsbut does not meet criteria for any other level of care. CSW has been calling several [facility] to inquire about availability.  Marylin Crosby, MD 12/22/2017, 2:46 PM

## 2017-12-22 NOTE — Progress Notes (Signed)
D - Patient in the day room upon arrival on the unit. Patient presents in a pleasant mood upon assessment. Patient stated that she had a good day. Patient denies SI/HI/AVH, pain, depression and anxiety.     A - Support and encouragement provided. Routine safety checks conducted Q 15 minutes per unit protocol. Patient informed to notify staff with  Any problems.   R - Patient contracts for safety at this time. Patient compliant with medications and unit procedures. Patient receptive, calm, and cooperative. Patient interacts well with peers and staff on the unit. Patient remains safe at this time.

## 2017-12-22 NOTE — Tx Team (Signed)
Interdisciplinary Treatment and Diagnostic Plan Update  12/22/2017 Time of Session: 8:30am Carolee Channell MRN: [id number]  Principal Diagnosis: Unspecified mood (affective) disorder (HCC)  Secondary Diagnoses: Principal Problem:   Unspecified mood (affective) disorder (HCC) Active Problems:   Korsakoff syndrome (HCC)   Mood disorder due to a general medical condition   Current Medications:  Current Facility-Administered Medications  Medication Dose Route Frequency Provider Last Rate Last Dose  . acetaminophen (TYLENOL) tablet 650 mg  650 mg Oral Q6H PRN Clapacs, John T, MD      . alum & mag hydroxide-simeth (MAALOX/MYLANTA) 200-200-20 MG/5ML suspension 30 mL  30 mL Oral Q4H PRN Clapacs, John T, MD      . benztropine (COGENTIN) tablet 1 mg  1 mg Oral Q6H PRN Beverly Sessions, MD       Or  . benztropine mesylate (COGENTIN) injection 1 mg  1 mg Intramuscular Q6H PRN Beverly Sessions, MD      . carbamazepine (TEGRETOL) tablet 300 mg  300 mg Oral BID McNew, Holly R, MD      . haloperidol (HALDOL) tablet 5 mg  5 mg Oral Q6H PRN Beverly Sessions, MD   5 mg at 11/22/17 1646   Or  . haloperidol lactate (HALDOL) injection 5 mg  5 mg Intramuscular Q6H PRN Beverly Sessions, MD   5 mg at 12/18/17 1403  . LORazepam (ATIVAN) injection 1 mg  1 mg Intramuscular Q6H PRN He, Jun, MD   1 mg at 11/22/17 0208  . LORazepam (ATIVAN) tablet 1 mg  1 mg Oral Q6H PRN Beverly Sessions, MD   1 mg at 11/22/17 1646   Or  . LORazepam (ATIVAN) injection 1 mg  1 mg Intramuscular Q6H PRN Beverly Sessions, MD   1 mg at 12/18/17 1407  . magnesium hydroxide (MILK OF MAGNESIA) suspension 30 mL  30 mL Oral Daily PRN Clapacs, John T, MD      . QUEtiapine (SEROQUEL) tablet 400 mg  400 mg Oral QHS McNew, Ileene Hutchinson, MD   400 mg at 12/21/17 2124  . thiamine (VITAMIN B-1) tablet 100 mg  100 mg Oral Daily Beverly Sessions, MD   100 mg at 12/21/17 1730  . traZODone (DESYREL) tablet 100 mg  100 mg Oral QHS PRN He, Jun, MD    100 mg at 12/20/17 2129   PTA Medications: Medications Prior to Admission  Medication Sig Dispense Refill Last Dose  . [EXPIRED] Multiple Vitamin (MULTI-VITAMINS) TABS Take 1 tablet by mouth daily.    unknown at unknown  . [EXPIRED] QUEtiapine (SEROQUEL) 25 MG tablet Take 25 mg by mouth daily.    unknown at unknown  . [EXPIRED] QUEtiapine (SEROQUEL) 50 MG tablet Take 50 mg by mouth at bedtime.    unknown at unknown  . topiramate (TOPAMAX) 50 MG tablet Take 50 mg by mouth daily.    unknwon at unknown    Patient Stressors: Other: People, buildings, rooms  Patient Strengths: Capable of independent living Physical Health Supportive family/friends  Treatment Modalities: Medication Management, Group therapy, Case management,  1 to 1 session with clinician, Psychoeducation, Recreational therapy.   Physician Treatment Plan for Primary Diagnosis: Unspecified mood (affective) disorder (HCC) Long Term Goal(s): Improvement in symptoms so as ready for discharge Improvement in symptoms so as ready for discharge   Short Term Goals: Ability to identify changes in lifestyle to reduce recurrence of condition will improve Ability to identify changes in lifestyle to reduce recurrence of condition will improve  Medication Management: Evaluate patient's response,  side effects, and tolerance of medication regimen.  Therapeutic Interventions: 1 to 1 sessions, Unit Group sessions and Medication administration.  Evaluation of Outcomes: Progressing  Physician Treatment Plan for Secondary Diagnosis: Principal Problem:   Unspecified mood (affective) disorder (HCC) Active Problems:   Korsakoff syndrome (HCC)   Mood disorder due to a general medical condition  Long Term Goal(s): Improvement in symptoms so as ready for discharge Improvement in symptoms so as ready for discharge   Short Term Goals: Ability to identify changes in lifestyle to reduce recurrence of condition will improve Ability to identify  changes in lifestyle to reduce recurrence of condition will improve     Medication Management: Evaluate patient's response, side effects, and tolerance of medication regimen.  Therapeutic Interventions: 1 to 1 sessions, Unit Group sessions and Medication administration.  Evaluation of Outcomes: Progressing   RN Treatment Plan for Primary Diagnosis: Unspecified mood (affective) disorder (HCC) Long Term Goal(s): Knowledge of disease and therapeutic regimen to maintain health will improve  Short Term Goals: Ability to identify and develop effective coping behaviors will improve and Compliance with prescribed medications will improve  Medication Management: RN will administer medications as ordered by provider, will assess and evaluate patient's response and provide education to patient for prescribed medication. RN will report any adverse and/or side effects to prescribing provider.  Therapeutic Interventions: 1 on 1 counseling sessions, Psychoeducation, Medication administration, Evaluate responses to treatment, Monitor vital signs and CBGs as ordered, Perform/monitor CIWA, COWS, AIMS and Fall Risk screenings as ordered, Perform wound care treatments as ordered.  Evaluation of Outcomes: Progressing   LCSW Treatment Plan for Primary Diagnosis: Unspecified mood (affective) disorder (HCC) Long Term Goal(s): Safe transition to appropriate next level of care at discharge, Engage patient in therapeutic group addressing interpersonal concerns.  Short Term Goals: Engage patient in aftercare planning with referrals and resources, Identify triggers associated with mental health/substance abuse issues and Increase skills for wellness and recovery  Therapeutic Interventions: Assess for all discharge needs, 1 to 1 time with Social worker, Explore available resources and support systems, Assess for adequacy in community support network, Educate family and significant other(s) on suicide prevention,  Complete Psychosocial Assessment, Interpersonal group therapy.  Evaluation of Outcomes: Progressing   Progress in Treatment: Attending groups: No. Participating in groups: No. Taking medication as prescribed: Yes. Toleration medication: Yes. Family/Significant other contact made: Yes, individual(s) contacted:  CSW has made contact with pt's guardian, Esmeralda Arthur with [employer]. Patient understands diagnosis: No. Discussing patient identified problems/goals with staff: Yes. Medical problems stabilized or resolved: Yes. Denies suicidal/homicidal ideation: Yes. Issues/concerns per patient self-inventory: No. Other:    New problem(s) identified: Yes, Describe:  Patient has been having a few behavioral issues on the unit.  New Short Term/Long Term Goal(s):  Patient Goals:  To be discharged  Discharge Plan or Barriers: TBD.  CSW, Pt.s guardian and Koren Shiver are continuing to explore appropriate residence options.  Pt may be more appropriate to go to an [facility] with CST. CSW waiting on status of patients referral to [facility].  Reason for Continuation of Hospitalization: Other; describe Need to identify and secure appropriate residential placement for discharge.  Estimated Length of Stay: 7 days  Recreational Therapy: Patient Stressors: N/A Patient Goal: Patient will engage in interactions with peers and staff in pro-social manner at least 2x within 5 recreation therapy group sessions  Attendees: Patient: 12/22/2017 11:58 AM  Physician: Corinna Gab, MD 12/22/2017 11:58 AM  Nursing: Hulan Amato, RN 12/22/2017 11:58 AM  RN Care Manager: 12/22/2017 11:58 AM  Social Worker: Joneen Roach, LCSWA 12/22/2017 11:58 AM  Recreational Therapist:  12/22/2017 11:58 AM  Other: Lowella Dandy, LCSW 12/22/2017 11:58 AM  Other:Harvey Beverely Pace, RHA 12/22/2017 11:58 AM  Other: 12/22/2017 11:58 AM    Scribe for Treatment Team: Johny Shears, LCSW 12/22/2017 11:58 AM

## 2017-12-22 NOTE — Progress Notes (Signed)
Patient alert and oriented. Took medications later due to irritability earlier in the day. Patient's mood became more even and pleasant this evening. Patient in dayroom with peers watching television, no more outburst heard or observed.

## 2017-12-22 NOTE — BHH Counselor (Signed)
CSW sent over information to [place]. CSW called to follow up on the referral. Zella Ball reports that Mrs. Joseph Art will call back with a decision.  Johny Shears, MSW, Theresia Majors, Bridget Hartshorn Clinical Social Worker 12/22/2017 3:29 PM

## 2017-12-22 NOTE — Progress Notes (Signed)
Recreation Therapy Notes   Date: 12/22/2017  Time: 9:30 am   Location: Craft room   Behavioral response: N/A   Intervention Topic: Coping  Discussion/Intervention: Patient did not attend group.   Clinical Observations/Feedback:  Patient did not attend group.   Rylynn Kobs LRT/CTRS        Keegan Ducey 12/22/2017 11:55 AM

## 2017-12-22 NOTE — Plan of Care (Signed)
Patient is alert and oriented. Patient is labile, continues to have outburst in the room but gets over the yelling in 10 -15 minutes. Patient denies SI, HI and AVH. Patient attends groups intermittently, interacts with peers minimally. No self injurious behaviors observed.15 Minute safety rounding to continue. Problem: Coping: Goal: Ability to identify and develop effective coping behavior will improve Outcome: Progressing Goal: Ability to interact with others will improve Outcome: Not Progressing Goal: Demonstration of participation in decision-making regarding own care will improve Outcome: Progressing Goal: Ability to use eye contact when communicating with others will improve Outcome: Progressing   Problem: Health Behavior/Discharge Planning: Goal: Identification of resources available to assist in meeting health care needs will improve Outcome: Progressing

## 2017-12-23 NOTE — Progress Notes (Signed)
MD informed this writer that it would be best for [place] to put patient's personal mail/cards in her locker with her other belongings. This Clinical research associate and security placed the cards into patient's locker.

## 2017-12-23 NOTE — Progress Notes (Signed)
Patient was informed by this writer that the phones would be turned off in a few minutes and patient verbalized understanding, however, when she hung up the phone and started walking towards her room she stuck up her middle finger to the staff in the nurse's station.

## 2017-12-23 NOTE — Progress Notes (Signed)
D- Patient alert and oriented. Patient presents in a pleasant mood on assessment stating to this writer that she is doing good and had no major complaints to voice at this time. Patient denies SI, HI, AVH, and pain, however, she reports on her self-inventory that her depression and anxiety is a "4/10". According to patient's self-inventory, her goal for today is to "work towards memory improvement/release", in which she will "take medication" in order to achieve this goal.  A- Scheduled medications administered to patient, per MD orders. Support and encouragement provided.  Routine safety checks conducted every 15 minutes.  Patient informed to notify staff with problems or concerns.  R- No adverse drug reactions noted. Patient contracts for safety at this time. Patient compliant with medications and treatment plan. Patient receptive, calm, and cooperative. Patient interacts well with others on the unit.  Patient remains safe at this time.

## 2017-12-23 NOTE — Progress Notes (Signed)
Jillian continues to show improvement in mood and thought process. Alert and oriented. Cooperative and visible in the milieu. Attended evening activities and maintained a positive attitude. Had a snack and received bedtime medications. Did not have any concerns. Patient went to her room at bed time and has been resting. No sign of distress while asleep. Safety precautions maintained.

## 2017-12-23 NOTE — Plan of Care (Signed)
Cooperative and compliant with treatment 

## 2017-12-23 NOTE — Plan of Care (Signed)
Patient has been observed in the milieu and outside in the courtyard interacting well with staff and other members on the unit without any issues. Patient has been participating in the decision-making process regarding her health-care. Patient uses fair eye contact when communicating with this Clinical research associate. Patient remains safe on the unit at this time.  Problem: Coping: Goal: Ability to identify and develop effective coping behavior will improve Outcome: Progressing Goal: Ability to interact with others will improve Outcome: Progressing Goal: Demonstration of participation in decision-making regarding own care will improve Outcome: Progressing Goal: Ability to use eye contact when communicating with others will improve Outcome: Progressing   Problem: Health Behavior/Discharge Planning: Goal: Identification of resources available to assist in meeting health care needs will improve Outcome: Progressing

## 2017-12-23 NOTE — Progress Notes (Signed)
Recreation Therapy Notes   Date: 12/23/2017  Time: 9:30 pm   Location: Craft Room   Behavioral response: N/A   Intervention Topic: Happiness  Discussion/Intervention: Patient did not attend group.   Clinical Observations/Feedback:  Patient did not attend group.   Rick Carruthers LRT/CTRS        Joanne Johnston 12/23/2017 11:17 AM 

## 2017-12-23 NOTE — Progress Notes (Signed)
[facility] MD Progress Note  12/23/2017 2:07 PM Joanne Johnston  MRN:  [id number] Subjective:  Pt was on the phone today smiling and laughing. She is somewhat irritable with staff but less verbal outbursts. She is interacting well with other peers and observed out in the courtyard talking with peers. She is taking medications. She is caring well for her ADLs. She does not endorse SI or any thoughts of self harm. She is tearful upon interview today. She is reviewing her journal that she keeps to help with her memory. She is upset about being in the hospital. She is hoping to go to a group home soon. She is calm and organized in thoughts. She willingly took her medications. She states that the Tegretol is better during the day than the Seroquel that she used to take. She states that she does not like medications too much but has noticed somewhat of an improvement in her moods.   Principal Problem: Unspecified mood (affective) disorder ([place]) Diagnosis:   Patient Active Problem List   Diagnosis Date Noted  . Unspecified mood (affective) disorder ([place]) [F39] 11/18/2017    Priority: High  . 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 Korsakoff syndrome ([place]) [V94] 12/04/2017  . Agitation [R45.1] 11/04/2017   Total Time spent with patient: 15 minutes  Past Psychiatric History: See H&P  Past Medical History:  Past Medical History:  Diagnosis Date  . Alcohol dependence ([place])   . Anxiety   . Dementia   . Depression   . Hypertension   . Tachycardia     Past Surgical History:  Procedure Laterality Date  . CESAREAN SECTION     x2   Family History: History reviewed. No pertinent family history. Family Psychiatric  History: See h&P Social History:  Social History   Substance and Sexual Activity  Alcohol Use Not Currently  . Frequency: Never   Comment: states hasn't had a drink in months     Social History   Substance and Sexual Activity  Drug Use Never     Social History   Socioeconomic History  . Marital status: Divorced    Spouse name: Not on file  . Number of children: Not on file  . Years of education: Not on file  . Highest education level: Not on file  Occupational History  . Not on file  Social Needs  . Financial resource strain: Not on file  . Food insecurity:    Worry: Not on file    Inability: Not on file  . Transportation needs:    Medical: Not on file    Non-medical: Not on file  Tobacco Use  . Smoking status: Never Smoker  . Smokeless tobacco: Never Used  Substance and Sexual Activity  . Alcohol use: Not Currently    Frequency: Never    Comment: states hasn't had a drink in months  . Drug use: Never  . Sexual activity: Not Currently  Lifestyle  . Physical activity:    Days per week: Not on file    Minutes per session: Not on file  . Stress: Not on file  Relationships  . Social connections:    Talks on phone: Not on file    Gets together: Not on file    Attends religious service: Not on file    Active member of club or organization: Not on file    Attends meetings of clubs or organizations: Not on file    Relationship status: Not on  file  Other Topics Concern  . Not on file  Social History Narrative  . Not on file   Additional Social History:                         Sleep: Good  Appetite:  Good  Current Medications: Current Facility-Administered Medications  Medication Dose Route Frequency Provider Last Rate Last Dose  . acetaminophen (TYLENOL) tablet 650 mg  650 mg Oral Q6H PRN Clapacs, John T, MD      . alum & mag hydroxide-simeth (MAALOX/MYLANTA) 200-200-20 MG/5ML suspension 30 mL  30 mL Oral Q4H PRN Clapacs, John T, MD      . benztropine (COGENTIN) tablet 1 mg  1 mg Oral Q6H PRN Lenward Chancellor, MD       Or  . benztropine mesylate (COGENTIN) injection 1 mg  1 mg Intramuscular Q6H PRN Lenward Chancellor, MD      . carbamazepine (TEGRETOL) tablet 300 mg  300 mg Oral BID Chico Cawood  R, MD   300 mg at 12/22/17 2135  . haloperidol (HALDOL) tablet 5 mg  5 mg Oral Q6H PRN Lenward Chancellor, MD   5 mg at 11/22/17 1646   Or  . haloperidol lactate (HALDOL) injection 5 mg  5 mg Intramuscular Q6H PRN Lenward Chancellor, MD   5 mg at 12/18/17 1403  . LORazepam (ATIVAN) injection 1 mg  1 mg Intramuscular Q6H PRN He, Jun, MD   1 mg at 11/22/17 0208  . LORazepam (ATIVAN) tablet 1 mg  1 mg Oral Q6H PRN Lenward Chancellor, MD   1 mg at 11/22/17 1646   Or  . LORazepam (ATIVAN) injection 1 mg  1 mg Intramuscular Q6H PRN Lenward Chancellor, MD   1 mg at 12/18/17 1407  . magnesium hydroxide (MILK OF MAGNESIA) suspension 30 mL  30 mL Oral Daily PRN Clapacs, John T, MD      . QUEtiapine (SEROQUEL) tablet 400 mg  400 mg Oral QHS Rosezella Kronick, Tyson Babinski, MD   400 mg at 12/22/17 2136  . thiamine (VITAMIN B-1) tablet 100 mg  100 mg Oral Daily Lenward Chancellor, MD   100 mg at 12/22/17 1720  . traZODone (DESYREL) tablet 100 mg  100 mg Oral QHS PRN He, Jun, MD   100 mg at 12/20/17 2129    Lab Results: No results found for this or any previous visit (from the past 48 hour(s)).  Blood Alcohol level:  Lab Results  Component Value Date   ETH <10 62/04/5595    Metabolic Disorder Labs: No results found for: HGBA1C, MPG No results found for: PROLACTIN No results found for: CHOL, TRIG, HDL, CHOLHDL, VLDL, LDLCALC  Physical Findings: AIMS: Facial and Oral Movements Muscles of Facial Expression: None, normal Lips and Perioral Area: None, normal Jaw: None, normal Tongue: None, normal,Extremity Movements Upper (arms, wrists, hands, fingers): None, normal Lower (legs, knees, ankles, toes): None, normal, Trunk Movements Neck, shoulders, hips: None, normal, Overall Severity Severity of abnormal movements (highest score from questions above): None, normal Incapacitation due to abnormal movements: None, normal Patient's awareness of abnormal movements (rate only patient's report): No Awareness, Dental  Status Current problems with teeth and/or dentures?: No Does patient usually wear dentures?: No  CIWA:    COWS:     Musculoskeletal: Strength & Muscle Tone: within normal limits Gait & Station: normal Patient leans: N/A  Psychiatric Specialty Exam: Physical Exam  Nursing note and vitals reviewed.   Review of Systems  All other systems reviewed and are negative.   Blood pressure 122/86, pulse 94, temperature 98.2 F (36.8 C), temperature source Oral, resp. rate 16, height '5\' 8"'  (1.727 m), weight 80.7 kg, SpO2 100 %.Body mass index is 27.06 kg/m.  General Appearance: Casual  Eye Contact:  Good  Speech:  Clear and Coherent  Volume:  Normal  Mood:  Irritable  Affect:  Congruent  Thought Process:  Coherent and Goal Directed  Orientation:  Full (Time, Place, and Person)  Thought Content:  Logical  Suicidal Thoughts:  No  Homicidal Thoughts:  No  Memory:  Immediate;   Poor  Judgement:  Impaired  Insight:  Lacking  Psychomotor Activity:  Normal  Concentration:  Concentration: Poor and Attention Span: Poor  Recall:  Poor  Fund of Knowledge:  Fair  Language:  Fair  Akathisia:  No      Assets:  Resilience  ADL's:  Intact  Cognition:  Impaired,  Moderate  Sleep:  Number of Hours: 5.45     Treatment Plan Summary: 47 yo female admitted due to behavioral disruption at memory care unit. She is still waiting placement. She is irritable at times but overall redirectable. Caring well for her ADLs.   Plan:  Mood -Continue Seroquel 400 gm qhs -continue Tegretol 300 mg TID  Korsakoff's syndrome -Neurology was already consulted while in hospital in [facility] and workup completed including MRI, CT head, RPR. 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 She also has long history of heavy alcohol abuse and poor dietary intake. Her physical symptoms are improved significant since then and only persistent symptoms are short  term memory loss ----MOCA on 9/27/19was 21/30 -PT evaluated and felt she was now independent with cares. -OT recommends supervised living due to poor short term memory issues and help with tracking day of the week and day to day management skills. This could be accomplished in an assisted living or groups home living situation  Dispo -We had care conference last week to discuss options for patient.They are still hesitant for this level of care for unclear reasonsbut does not meet criteria for any other level of care. CSW has been calling several [place] to inquire about availability.   Marylin Crosby, MD 12/23/2017, 2:07 PM

## 2017-12-24 NOTE — Progress Notes (Signed)
Recreation Therapy Notes  Date: 12/23/2017  Time: 9:30 pm   Location: Craft Room   Behavioral response: N/A   Intervention Topic: Communication  Discussion/Intervention: Patient did not attend group.   Clinical Observations/Feedback:  Patient did not attend group.   Celena Lanius LRT/CTRS        Izaiha Lo 12/24/2017 11:49 AM 

## 2017-12-24 NOTE — [facility] Group Notes (Signed)

## 2017-12-24 NOTE — [facility] Group Notes (Signed)
[facility] Group Notes:  (Nursing/MHT/Case Management/Adjunct)  Date:  12/24/2017  Time:  9:45 PM  Type of Therapy:  Group Therapy  Participation Level:  Active  Participation Quality:  Appropriate  Affect:  Appropriate  Cognitive:  Alert  Insight:  Appropriate  Engagement in Group:  Engaged  Modes of Intervention:  Support  Summary of Progress/Problems:  Joanne Johnston 12/24/2017, 9:45 PM

## 2017-12-24 NOTE — Progress Notes (Signed)
D- Patient alert and oriented. Patient presents in a pleasant mood on assessment. Patient continues to be minimal in her conversations with this Clinical research associate. Patient denies SI, HI, AVH, and pain at this time. Patient also denies depression/anxiety. Patient had no stated goals for today.   A- Scheduled medications administered to patient, per MD orders. Support and encouragement provided.  Routine safety checks conducted every 15 minutes.  Patient informed to notify staff with problems or concerns.  R- No adverse drug reactions noted. Patient contracts for safety at this time. Patient compliant with medications and treatment plan. Patient receptive, calm, and cooperative. Patient interacts well with others on the unit.  Patient remains safe at this time.

## 2017-12-24 NOTE — Progress Notes (Signed)
Patient refused her Tegretol. This Clinical research associate will notify MD.

## 2017-12-24 NOTE — Progress Notes (Signed)
[facility] MD Progress Note  12/24/2017 2:57 PM Joanne Johnston  MRN:  [id number] Subjective:  Pt has been more calm on the unit. She has been staying to herself. She is organized in thoughts today. She continues to use her calendar and journal to write her daily activities to help her memory. She expresses her frustration to me about still being in the hospital. She states, "I have alcoholism and dementia. I don't belong here!." Empathized with her and let her know that we continue to look for an appropriate level of care for her and she was appreciative of this.   Principal Problem: Unspecified mood (affective) disorder ([place]) Diagnosis:   Patient Active Problem List   Diagnosis Date Noted  . Unspecified mood (affective) disorder ([place]) [F39] 11/18/2017    Priority: High  . 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 Agitation [R45.1] 11/04/2017   Total Time spent with patient: 15 minutes  Past Psychiatric History: See H&P  Past Medical History:  Past Medical History:  Diagnosis Date  . Alcohol dependence ([employer])   . Anxiety   . Dementia   . Depression   . Hypertension   . Tachycardia     Past Surgical History:  Procedure Laterality Date  . CESAREAN SECTION     x2   Family History: History reviewed. No pertinent family history. Family Psychiatric  History: See H&P Social History:  Social History   Substance and Sexual Activity  Alcohol Use Not Currently  . Frequency: Never   Comment: states hasn't had a drink in months     Social History   Substance and Sexual Activity  Drug Use Never    Social History   Socioeconomic History  . Marital status: Divorced    Spouse name: Not on file  . Number of children: Not on file  . Years of education: Not on file  . Highest education level: Not on file  Occupational History  . Not on file  Social Needs  . Financial resource strain: Not on file  . Food  insecurity:    Worry: Not on file    Inability: Not on file  . Transportation needs:    Medical: Not on file    Non-medical: Not on file  Tobacco Use  . Smoking status: Never Smoker  . Smokeless tobacco: Never Used  Substance and Sexual Activity  . Alcohol use: Not Currently    Frequency: Never    Comment: states hasn't had a drink in months  . Drug use: Never  . Sexual activity: Not Currently  Lifestyle  . Physical activity:    Days per week: Not on file    Minutes per session: Not on file  . Stress: Not on file  Relationships  . Social connections:    Talks on phone: Not on file    Gets together: Not on file    Attends religious service: Not on file    Active member of club or organization: Not on file    Attends meetings of clubs or organizations: Not on file    Relationship status: Not on file  Other Topics Concern  . Not on file  Social History Narrative  . Not on file   Additional Social History:                         Sleep: Good  Appetite:  Good  Current Medications: Current  Facility-Administered Medications  Medication Dose Route Frequency Provider Last Rate Last Dose  . acetaminophen (TYLENOL) tablet 650 mg  650 mg Oral Q6H PRN Clapacs, John T, MD      . alum & mag hydroxide-simeth (MAALOX/MYLANTA) 200-200-20 MG/5ML suspension 30 mL  30 mL Oral Q4H PRN Clapacs, John T, MD      . benztropine (COGENTIN) tablet 1 mg  1 mg Oral Q6H PRN Lenward Chancellor, MD       Or  . benztropine mesylate (COGENTIN) injection 1 mg  1 mg Intramuscular Q6H PRN Lenward Chancellor, MD      . carbamazepine (TEGRETOL) tablet 300 mg  300 mg Oral BID Ismael Karge R, MD   300 mg at 12/23/17 2139  . haloperidol (HALDOL) tablet 5 mg  5 mg Oral Q6H PRN Lenward Chancellor, MD   5 mg at 11/22/17 1646   Or  . haloperidol lactate (HALDOL) injection 5 mg  5 mg Intramuscular Q6H PRN Lenward Chancellor, MD   5 mg at 12/18/17 1403  . LORazepam (ATIVAN) injection 1 mg  1 mg Intramuscular  Q6H PRN He, Jun, MD   1 mg at 11/22/17 0208  . LORazepam (ATIVAN) tablet 1 mg  1 mg Oral Q6H PRN Lenward Chancellor, MD   1 mg at 11/22/17 1646   Or  . LORazepam (ATIVAN) injection 1 mg  1 mg Intramuscular Q6H PRN Lenward Chancellor, MD   1 mg at 12/18/17 1407  . magnesium hydroxide (MILK OF MAGNESIA) suspension 30 mL  30 mL Oral Daily PRN Clapacs, John T, MD      . QUEtiapine (SEROQUEL) tablet 400 mg  400 mg Oral QHS Ethridge Sollenberger, Tyson Babinski, MD   400 mg at 12/23/17 2139  . thiamine (VITAMIN B-1) tablet 100 mg  100 mg Oral Daily Lenward Chancellor, MD   100 mg at 12/23/17 1416  . traZODone (DESYREL) tablet 100 mg  100 mg Oral QHS PRN He, Jun, MD   100 mg at 12/20/17 2129    Lab Results: No results found for this or any previous visit (from the past 48 hour(s)).  Blood Alcohol level:  Lab Results  Component Value Date   ETH <10 44/04/4740    Metabolic Disorder Labs: No results found for: HGBA1C, MPG No results found for: PROLACTIN No results found for: CHOL, TRIG, HDL, CHOLHDL, VLDL, LDLCALC  Physical Findings: AIMS: Facial and Oral Movements Muscles of Facial Expression: None, normal Lips and Perioral Area: None, normal Jaw: None, normal Tongue: None, normal,Extremity Movements Upper (arms, wrists, hands, fingers): None, normal Lower (legs, knees, ankles, toes): None, normal, Trunk Movements Neck, shoulders, hips: None, normal, Overall Severity Severity of abnormal movements (highest score from questions above): None, normal Incapacitation due to abnormal movements: None, normal Patient's awareness of abnormal movements (rate only patient's report): No Awareness, Dental Status Current problems with teeth and/or dentures?: No Does patient usually wear dentures?: No  CIWA:    COWS:     Musculoskeletal: Strength & Muscle Tone: within normal limits Gait & Station: normal Patient leans: N/A  Psychiatric Specialty Exam: Physical Exam  Nursing note and vitals reviewed.   Review of  Systems  All other systems reviewed and are negative.   Blood pressure 122/86, pulse 94, temperature 98.2 F (36.8 C), temperature source Oral, resp. rate 16, height '5\' 8"'  (1.727 m), weight 80.7 kg, SpO2 100 %.Body mass index is 27.06 kg/m.  General Appearance: Casual  Eye Contact:  Good  Speech:  Clear and Coherent  Volume:  Normal  Mood:  Depressed  Affect:  Tearful  Thought Process:  Coherent and Goal Directed  Orientation:  Full (Time, Place, and Person)  Thought Content:  Logical  Suicidal Thoughts:  No  Homicidal Thoughts:  No  Memory:  Immediate;   Poor  Judgement:  Impaired  Insight:  Lacking  Psychomotor Activity:  Normal  Concentration:  Concentration: Poor  Recall:  Poor  Fund of Knowledge:  Fair  Language:  Fair  Akathisia:  No      Assets:  Resilience  ADL's:  Intact  Cognition:  Impaired,  Moderate  Sleep:  Number of Hours: 7     Treatment Plan Summary:  47 yo female admitted due to behavioral disruption on memory care unit. She is still waiting placement.   Plan:  Mood -Continue Seroquel 400 mg qhs -Continue Tegretol 300 mg TID  Korsakoff's syndrome -Neurology was already consulted while in hospital in [facility] and workup completed including MRI, CT head, RPR. 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 She also has long history of heavy alcohol abuse and poor dietary intake. Her physical symptoms are improved significant since then and only persistent symptoms are short term memory loss ----MOCA on 9/27/19was 21/30 -PT evaluated and felt she was now independent with cares. -OT recommends supervised living due to poor short term memory issues and help with tracking day of the week and day to day management skills. This could be accomplished in an assisted living or groups home living situation  Dispo -We had care conferencelast week to discuss options for patient.They are still  hesitant for this level of care for unclear reasonsbut does not meet criteria for any other level of care. CSW has been calling several [facility] to inquire about availability.  Marylin Crosby, MD 12/24/2017, 2:57 PM

## 2017-12-24 NOTE — Plan of Care (Signed)
Patient has been observed in the milieu several times interacting well with staff and other members on the unit without any issues. Patient has been participating in the decision-making process regarding her health-care. Patient uses fair eye contact when communicating with this Clinical research associate. Patient remains safe on the unit at this time.  Problem: Coping: Goal: Ability to identify and develop effective coping behavior will improve Outcome: Progressing Goal: Ability to interact with others will improve Outcome: Progressing Goal: Demonstration of participation in decision-making regarding own care will improve Outcome: Progressing Goal: Ability to use eye contact when communicating with others will improve Outcome: Progressing   Problem: Health Behavior/Discharge Planning: Goal: Identification of resources available to assist in meeting health care needs will improve Outcome: Progressing

## 2017-12-25 NOTE — Progress Notes (Signed)
Recreation Therapy Notes  Date: 12/25/2017  Time: 9:30 pm   Location: Craft Room   Behavioral response: N/A   Intervention Topic: Emotions  Discussion/Intervention: Patient did not attend group.   Clinical Observations/Feedback:  Patient did not attend group.   Jilberto Vanderwall LRT/CTRS        Correna Meacham 12/25/2017 11:06 AM

## 2017-12-25 NOTE — Plan of Care (Signed)
Was visible in the milieu but refused bedtime medications.

## 2017-12-25 NOTE — Progress Notes (Signed)
Patient was present in the milieu at the beginning of the shift. Remained cooperative. Attended group and was pleasant. Had a snack then went to her room. Became guarded and agitated at medication time, refusing all her bedtime medications "I don't want that shit" Patient. Refused medications upon multiple attempts and asked to be left alone. She otherwise did not have any other concerns. Currently sleeping and safety maintained.

## 2017-12-25 NOTE — Plan of Care (Signed)
Patient refused Tegretol.Patient verbalized her frustration being here states "ya I am fine in this closed place not doing anything".Visible in the milieu.No aggressive behaviors noted.Appetite and energy level good.

## 2017-12-25 NOTE — [facility] Group Notes (Signed)
LCSW Group Therapy Note  12/25/2017 1:00 pm  Type of Therapy/Topic:  Group Therapy:  Balance in Life  Participation Level:  Did Not Attend  Description of Group:    This group will address the concept of balance and how it feels and looks when one is unbalanced. Patients will be encouraged to process areas in their lives that are out of balance and identify reasons for remaining unbalanced. Facilitators will guide patients in utilizing problem-solving interventions to address and correct the stressor making their life unbalanced. Understanding and applying boundaries will be explored and addressed for obtaining and maintaining a balanced life. Patients will be encouraged to explore ways to assertively make their unbalanced needs known to significant others in their lives, using other group members and facilitator for support and feedback.  Therapeutic Goals: 1. Patient will identify two or more emotions or situations they have that consume much of in their lives. 2. Patient will identify signs/triggers that life has become out of balance:  3. Patient will identify two ways to set boundaries in order to achieve balance in their lives:  4. Patient will demonstrate ability to communicate their needs through discussion and/or role plays  Summary of Patient Progress:      Therapeutic Modalities:   Cognitive Behavioral Therapy Solution-Focused Therapy Assertiveness Training  Alease Frame, LCSW 12/25/2017 3:46 PM

## 2017-12-25 NOTE — Progress Notes (Signed)
[facility] MD Progress Note  12/25/2017 3:44 PM Joanne Johnston  MRN:  [id number] Subjective:  Pt has been calm the past several days. She is randomly refusing Tegretol but did take Seroquel last night. She is sleeping well. She is polite and calm on interaction today. She is still frustrated about being in the hospital. Discussed importance of taking her medications to improve her chances of getting into a group home. She is organized in thoughts. Taking good care of her ADLS.   Principal Problem: Unspecified mood (affective) disorder ([place]) Diagnosis:   Patient Active Problem List   Diagnosis Date Noted  . Unspecified mood (affective) disorder ([place]) [F39] 11/18/2017    Priority: High  . 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 Agitation [R45.1] 11/04/2017   Total Time spent with patient: 15 minutes  Past Psychiatric History: See h&P  Past Medical History:  Past Medical History:  Diagnosis Date  . Alcohol dependence ([place])   . Anxiety   . Dementia   . Depression   . Hypertension   . Tachycardia     Past Surgical History:  Procedure Laterality Date  . CESAREAN SECTION     x2   Family History: History reviewed. No pertinent family history. Family Psychiatric  History: See H&P Social History:  Social History   Substance and Sexual Activity  Alcohol Use Not Currently  . Frequency: Never   Comment: states hasn't had a drink in months     Social History   Substance and Sexual Activity  Drug Use Never    Social History   Socioeconomic History  . Marital status: Divorced    Spouse name: Not on file  . Number of children: Not on file  . Years of education: Not on file  . Highest education level: Not on file  Occupational History  . Not on file  Social Needs  . Financial resource strain: Not on file  . Food insecurity:    Worry: Not on file    Inability: Not on file  . Transportation needs:   Medical: Not on file    Non-medical: Not on file  Tobacco Use  . Smoking status: Never Smoker  . Smokeless tobacco: Never Used  Substance and Sexual Activity  . Alcohol use: Not Currently    Frequency: Never    Comment: states hasn't had a drink in months  . Drug use: Never  . Sexual activity: Not Currently  Lifestyle  . Physical activity:    Days per week: Not on file    Minutes per session: Not on file  . Stress: Not on file  Relationships  . Social connections:    Talks on phone: Not on file    Gets together: Not on file    Attends religious service: Not on file    Active member of club or organization: Not on file    Attends meetings of clubs or organizations: Not on file    Relationship status: Not on file  Other Topics Concern  . Not on file  Social History Narrative  . Not on file   Additional Social History:                         Sleep: Good  Appetite:  Good  Current Medications: Current Facility-Administered Medications  Medication Dose Route Frequency Provider Last Rate Last Dose  . acetaminophen (TYLENOL) tablet 650 mg  650  mg Oral Q6H PRN Clapacs, Madie Reno, MD      . alum & mag hydroxide-simeth (MAALOX/MYLANTA) 200-200-20 MG/5ML suspension 30 mL  30 mL Oral Q4H PRN Clapacs, John T, MD      . benztropine (COGENTIN) tablet 1 mg  1 mg Oral Q6H PRN Lenward Chancellor, MD       Or  . benztropine mesylate (COGENTIN) injection 1 mg  1 mg Intramuscular Q6H PRN Lenward Chancellor, MD      . carbamazepine (TEGRETOL) tablet 300 mg  300 mg Oral BID Macon Lesesne R, MD   300 mg at 12/23/17 2139  . haloperidol (HALDOL) tablet 5 mg  5 mg Oral Q6H PRN Lenward Chancellor, MD   5 mg at 11/22/17 1646   Or  . haloperidol lactate (HALDOL) injection 5 mg  5 mg Intramuscular Q6H PRN Lenward Chancellor, MD   5 mg at 12/18/17 1403  . LORazepam (ATIVAN) injection 1 mg  1 mg Intramuscular Q6H PRN He, Jun, MD   1 mg at 11/22/17 0208  . LORazepam (ATIVAN) tablet 1 mg  1 mg Oral  Q6H PRN Lenward Chancellor, MD   1 mg at 11/22/17 1646   Or  . LORazepam (ATIVAN) injection 1 mg  1 mg Intramuscular Q6H PRN Lenward Chancellor, MD   1 mg at 12/18/17 1407  . magnesium hydroxide (MILK OF MAGNESIA) suspension 30 mL  30 mL Oral Daily PRN Clapacs, John T, MD      . QUEtiapine (SEROQUEL) tablet 400 mg  400 mg Oral QHS Brylei Pedley, Tyson Babinski, MD   400 mg at 12/24/17 2156  . thiamine (VITAMIN B-1) tablet 100 mg  100 mg Oral Daily Lenward Chancellor, MD   100 mg at 12/25/17 1219  . traZODone (DESYREL) tablet 100 mg  100 mg Oral QHS PRN He, Jun, MD   100 mg at 12/20/17 2129    Lab Results: No results found for this or any previous visit (from the past 48 hour(s)).  Blood Alcohol level:  Lab Results  Component Value Date   ETH <10 09/47/0962    Metabolic Disorder Labs: No results found for: HGBA1C, MPG No results found for: PROLACTIN No results found for: CHOL, TRIG, HDL, CHOLHDL, VLDL, LDLCALC  Physical Findings: AIMS: Facial and Oral Movements Muscles of Facial Expression: None, normal Lips and Perioral Area: None, normal Jaw: None, normal Tongue: None, normal,Extremity Movements Upper (arms, wrists, hands, fingers): None, normal Lower (legs, knees, ankles, toes): None, normal, Trunk Movements Neck, shoulders, hips: None, normal, Overall Severity Severity of abnormal movements (highest score from questions above): None, normal Incapacitation due to abnormal movements: None, normal Patient's awareness of abnormal movements (rate only patient's report): No Awareness, Dental Status Current problems with teeth and/or dentures?: No Does patient usually wear dentures?: No  CIWA:    COWS:     Musculoskeletal: Strength & Muscle Tone: within normal limits Gait & Station: normal Patient leans: N/A  Psychiatric Specialty Exam: Physical Exam  Nursing note and vitals reviewed.   Review of Systems  All other systems reviewed and are negative.   Blood pressure (!) 123/97, pulse 75,  temperature 98.3 F (36.8 C), temperature source Oral, resp. rate 17, height '5\' 8"'  (1.727 m), weight 80.7 kg, SpO2 96 %.Body mass index is 27.06 kg/m.  General Appearance: Casual  Eye Contact:  Good  Speech:  Clear and Coherent  Volume:  Normal  Mood:  Euthymic  Affect:  Appropriate  Thought Process:  Coherent and Goal Directed  Orientation:  Full (Time, Place, and Person)  Thought Content:  Logical  Suicidal Thoughts:  No  Homicidal Thoughts:  No  Memory:  Immediate;   Fair  Judgement:  Impaired  Insight:  Lacking  Psychomotor Activity:  Normal  Concentration:  Concentration: Poor  Recall:  Poor  Fund of Knowledge:  Fair  Language:  Fair  Akathisia:  No      Assets:  Resilience  ADL's:  Intact  Cognition:  Impaired,  Moderate  Sleep:  Number of Hours: 6     Treatment Plan Summary: 47 yo female admitted due to behavioral disruption at memory care unit. Still waiting placement.   Plan:  -Continue Seroquel 400 mg qhs -Continue Tegretol 300 mg TID  Korsakoff's syndrome -Neurology was already consulted while in hospital in [facility] and workup completed including MRI, CT head, RPR. 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 She also has long history of heavy alcohol abuse and poor dietary intake. Her physical symptoms are improved significant since then and only persistent symptoms are short term memory loss ----MOCA on 9/27/19was 21/30 -PT evaluated and felt she was now independent with cares. -OT recommends supervised living due to poor short term memory issues and help with tracking day of the week and day to day management skills. This could be accomplished in an assisted living or groups home living situation  Dispo -We had care conferencelast weekto discuss options for patient.They are still hesitant for this level of care for unclear reasonsbut does not meet criteria for any other level of care.  CSW has been calling several [place] to inquire about availability Marylin Crosby, MD 12/25/2017, 3:44 PM

## 2017-12-26 NOTE — Progress Notes (Addendum)
[facility] MD Progress Note  12/26/2017 12:45 PM Joanne Johnston  MRN:  [id number] Subjective:  Pt has been calm on the unit. NO outbursts in a few days. She is taking her medications. She is interacting well with other peers. She is initially slightly irritable but then affect changes when placement discussion begins. She becomes more euthymic and happy about our effort to find appropriate placement. Discussed what are recommendations are of a group home. She does remember this conversation of "getting me in a more appropriate place." She is organized in her thought process.   Principal Problem: Unspecified mood (affective) disorder ([place]) Diagnosis:   Patient Active Problem List   Diagnosis Date Noted  . Unspecified mood (affective) disorder ([place]) [F39] 11/18/2017    Priority: High  . 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 Agitation [R45.1] 11/04/2017   Total Time spent with patient: 15 minutes  Past Psychiatric History: See h&p  Past Medical History:  Past Medical History:  Diagnosis Date  . Alcohol dependence ([place])   . Anxiety   . Dementia   . Depression   . Hypertension   . Tachycardia     Past Surgical History:  Procedure Laterality Date  . CESAREAN SECTION     x2   Family History: History reviewed. No pertinent family history. Family Psychiatric  History: See H&P Social History:  Social History   Substance and Sexual Activity  Alcohol Use Not Currently  . Frequency: Never   Comment: states hasn't had a drink in months     Social History   Substance and Sexual Activity  Drug Use Never    Social History   Socioeconomic History  . Marital status: Divorced    Spouse name: Not on file  . Number of children: Not on file  . Years of education: Not on file  . Highest education level: Not on file  Occupational History  . Not on file  Social Needs  . Financial resource strain: Not on file   . Food insecurity:    Worry: Not on file    Inability: Not on file  . Transportation needs:    Medical: Not on file    Non-medical: Not on file  Tobacco Use  . Smoking status: Never Smoker  . Smokeless tobacco: Never Used  Substance and Sexual Activity  . Alcohol use: Not Currently    Frequency: Never    Comment: states hasn't had a drink in months  . Drug use: Never  . Sexual activity: Not Currently  Lifestyle  . Physical activity:    Days per week: Not on file    Minutes per session: Not on file  . Stress: Not on file  Relationships  . Social connections:    Talks on phone: Not on file    Gets together: Not on file    Attends religious service: Not on file    Active member of club or organization: Not on file    Attends meetings of clubs or organizations: Not on file    Relationship status: Not on file  Other Topics Concern  . Not on file  Social History Narrative  . Not on file   Additional Social History:                         Sleep: Good  Appetite:  Good  Current Medications: Current Facility-Administered Medications  Medication Dose Route  Frequency Provider Last Rate Last Dose  . acetaminophen (TYLENOL) tablet 650 mg  650 mg Oral Q6H PRN Clapacs, John T, MD      . alum & mag hydroxide-simeth (MAALOX/MYLANTA) 200-200-20 MG/5ML suspension 30 mL  30 mL Oral Q4H PRN Clapacs, John T, MD      . benztropine (COGENTIN) tablet 1 mg  1 mg Oral Q6H PRN Lenward Chancellor, MD       Or  . benztropine mesylate (COGENTIN) injection 1 mg  1 mg Intramuscular Q6H PRN Lenward Chancellor, MD      . carbamazepine (TEGRETOL) tablet 300 mg  300 mg Oral BID Sydell Prowell R, MD   300 mg at 12/26/17 1228  . haloperidol (HALDOL) tablet 5 mg  5 mg Oral Q6H PRN Lenward Chancellor, MD   5 mg at 11/22/17 1646   Or  . haloperidol lactate (HALDOL) injection 5 mg  5 mg Intramuscular Q6H PRN Lenward Chancellor, MD   5 mg at 12/18/17 1403  . LORazepam (ATIVAN) injection 1 mg  1 mg  Intramuscular Q6H PRN He, Jun, MD   1 mg at 11/22/17 0208  . LORazepam (ATIVAN) tablet 1 mg  1 mg Oral Q6H PRN Lenward Chancellor, MD   1 mg at 11/22/17 1646   Or  . LORazepam (ATIVAN) injection 1 mg  1 mg Intramuscular Q6H PRN Lenward Chancellor, MD   1 mg at 12/18/17 1407  . magnesium hydroxide (MILK OF MAGNESIA) suspension 30 mL  30 mL Oral Daily PRN Clapacs, John T, MD      . QUEtiapine (SEROQUEL) tablet 400 mg  400 mg Oral QHS Janeese Mcgloin, Tyson Babinski, MD   400 mg at 12/25/17 2113  . thiamine (VITAMIN B-1) tablet 100 mg  100 mg Oral Daily Lenward Chancellor, MD   100 mg at 12/26/17 1229  . traZODone (DESYREL) tablet 100 mg  100 mg Oral QHS PRN He, Jun, MD   100 mg at 12/20/17 2129    Lab Results: No results found for this or any previous visit (from the past 48 hour(s)).  Blood Alcohol level:  Lab Results  Component Value Date   ETH <10 83/10/4074    Metabolic Disorder Labs: No results found for: HGBA1C, MPG No results found for: PROLACTIN No results found for: CHOL, TRIG, HDL, CHOLHDL, VLDL, LDLCALC  Physical Findings: AIMS: Facial and Oral Movements Muscles of Facial Expression: None, normal Lips and Perioral Area: None, normal Jaw: None, normal Tongue: None, normal,Extremity Movements Upper (arms, wrists, hands, fingers): None, normal Lower (legs, knees, ankles, toes): None, normal, Trunk Movements Neck, shoulders, hips: None, normal, Overall Severity Severity of abnormal movements (highest score from questions above): None, normal Incapacitation due to abnormal movements: None, normal Patient's awareness of abnormal movements (rate only patient's report): No Awareness, Dental Status Current problems with teeth and/or dentures?: No Does patient usually wear dentures?: No  CIWA:    COWS:     Musculoskeletal: Strength & Muscle Tone: within normal limits Gait & Station: normal Patient leans: N/A  Psychiatric Specialty Exam: Physical Exam  Nursing note and vitals reviewed.    Review of Systems  All other systems reviewed and are negative.   Blood pressure (!) 123/97, pulse 75, temperature 98.3 F (36.8 C), temperature source Oral, resp. rate 17, height '5\' 8"'  (1.727 m), weight 80.7 kg, SpO2 96 %.Body mass index is 27.06 kg/m.  General Appearance: Casual  Eye Contact:  Good  Speech:  Clear and Coherent  Volume:  Normal  Mood:  Irritable  Affect:  Appropriate  Thought Process:  Coherent and Goal Directed  Orientation:  Full (Time, Place, and Person)  Thought Content:  Logical  Suicidal Thoughts:  No  Homicidal Thoughts:  No  Memory:  Immediate;   Poor  Judgement:  Impaired  Insight:  Lacking  Psychomotor Activity:  Normal  Concentration:  Concentration: Poor  Recall:  Poor  Fund of Knowledge:  Fair  Language:  Fair  Akathisia:  No      Assets:  Resilience  ADL's:  Intact  Cognition:  Impaired,  Moderate  Sleep:  Number of Hours: 6.15     Treatment Plan Summary: Still waiting on placement to group home   Plan:  Mood Continue Seroquel 400 mg qhs -Continue Tegretol 300 mg BID   Korsakoff's syndrome -Neurology was already consulted while in hospital in [facility] and workup completed including MRI, CT head, RPR. 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 She also has long history of heavy alcohol abuse and poor dietary intake. Her physical symptoms are improved significant since then and only persistent symptoms are short term memory loss ----MOCA on 9/27/19was 21/30 -PT evaluated and felt she was now independent with cares. -OT recommends supervised living due to poor short term memory issues and help with tracking day of the week and day to day management skills. This could be accomplished in an assisted living or groups home living situation  Dispo -We had care conferencelast weekto discuss options for patient.CSW continues to look into group home placement Marylin Crosby, MD 12/26/2017, 12:45 PM

## 2017-12-26 NOTE — Tx Team (Signed)
Interdisciplinary Treatment and Diagnostic Plan Update  12/26/2017 Time of Session: 8:30am Joanne Johnston MRN: [id number]  Principal Diagnosis: Unspecified mood (affective) disorder ([facility])  Secondary Diagnoses: Principal Problem:   Unspecified mood (affective) disorder ([facility]) Active Problems:   Korsakoff syndrome ([facility])   Mood disorder due to a general medical condition   Current Medications:  Current Facility-Administered Medications  Medication Dose Route Frequency Provider Last Rate Last Dose  . acetaminophen (TYLENOL) tablet 650 mg  650 mg Oral Q6H PRN Clapacs, John T, MD      . alum & mag hydroxide-simeth (MAALOX/MYLANTA) 200-200-20 MG/5ML suspension 30 mL  30 mL Oral Q4H PRN Clapacs, John T, MD      . benztropine (COGENTIN) tablet 1 mg  1 mg Oral Q6H PRN Beverly Sessions, MD       Or  . benztropine mesylate (COGENTIN) injection 1 mg  1 mg Intramuscular Q6H PRN Beverly Sessions, MD      . carbamazepine (TEGRETOL) tablet 300 mg  300 mg Oral BID McNew, Holly R, MD   300 mg at 12/25/17 2113  . haloperidol (HALDOL) tablet 5 mg  5 mg Oral Q6H PRN Beverly Sessions, MD   5 mg at 11/22/17 1646   Or  . haloperidol lactate (HALDOL) injection 5 mg  5 mg Intramuscular Q6H PRN Beverly Sessions, MD   5 mg at 12/18/17 1403  . LORazepam (ATIVAN) injection 1 mg  1 mg Intramuscular Q6H PRN He, Jun, MD   1 mg at 11/22/17 0208  . LORazepam (ATIVAN) tablet 1 mg  1 mg Oral Q6H PRN Beverly Sessions, MD   1 mg at 11/22/17 1646   Or  . LORazepam (ATIVAN) injection 1 mg  1 mg Intramuscular Q6H PRN Beverly Sessions, MD   1 mg at 12/18/17 1407  . magnesium hydroxide (MILK OF MAGNESIA) suspension 30 mL  30 mL Oral Daily PRN Clapacs, John T, MD      . QUEtiapine (SEROQUEL) tablet 400 mg  400 mg Oral QHS McNew, Ileene Hutchinson, MD   400 mg at 12/25/17 2113  . thiamine (VITAMIN B-1) tablet 100 mg  100 mg Oral Daily Beverly Sessions, MD   100 mg at 12/25/17 1219  . traZODone (DESYREL) tablet 100 mg  100 mg Oral  QHS PRN He, Jun, MD   100 mg at 12/20/17 2129   PTA Medications: Medications Prior to Admission  Medication Sig Dispense Refill Last Dose  . [EXPIRED] Multiple Vitamin (MULTI-VITAMINS) TABS Take 1 tablet by mouth daily.    unknown at unknown  . [EXPIRED] QUEtiapine (SEROQUEL) 25 MG tablet Take 25 mg by mouth daily.    unknown at unknown  . [EXPIRED] QUEtiapine (SEROQUEL) 50 MG tablet Take 50 mg by mouth at bedtime.    unknown at unknown  . topiramate (TOPAMAX) 50 MG tablet Take 50 mg by mouth daily.    unknwon at unknown    Patient Stressors: Other: People, buildings, rooms  Patient Strengths: Capable of independent living Physical Health Supportive family/friends  Treatment Modalities: Medication Management, Group therapy, Case management,  1 to 1 session with clinician, Psychoeducation, Recreational therapy.   Physician Treatment Plan for Primary Diagnosis: Unspecified mood (affective) disorder ([facility]) Long Term Goal(s): Improvement in symptoms so as ready for discharge Improvement in symptoms so as ready for discharge   Short Term Goals: Ability to identify changes in lifestyle to reduce recurrence of condition will improve Ability to identify changes in lifestyle to reduce recurrence of condition will improve  Medication Management:  Evaluate patient's response, side effects, and tolerance of medication regimen.  Therapeutic Interventions: 1 to 1 sessions, Unit Group sessions and Medication administration.  Evaluation of Outcomes: Progressing  Physician Treatment Plan for Secondary Diagnosis: Principal Problem:   Unspecified mood (affective) disorder (H[facility]) Active Problems:   Korsakoff syndrome (H[facility])   Mood disorder due to a general medical condition  Long Term Goal(s): Improvement in symptoms so as ready for discharge Improvement in symptoms so as ready for discharge   Short Term Goals: Ability to identify changes in lifestyle to reduce recurrence of condition will  improve Ability to identify changes in lifestyle to reduce recurrence of condition will improve     Medication Management: Evaluate patient's response, side effects, and tolerance of medication regimen.  Therapeutic Interventions: 1 to 1 sessions, Unit Group sessions and Medication administration.  Evaluation of Outcomes: Progressing   RN Treatment Plan for Primary Diagnosis: Unspecified mood (affective) disorder (H[facility]) Long Term Goal(s): Knowledge of disease and therapeutic regimen to maintain health will improve  Short Term Goals: Ability to identify and develop effective coping behaviors will improve and Compliance with prescribed medications will improve  Medication Management: RN will administer medications as ordered by provider, will assess and evaluate patient's response and provide education to patient for prescribed medication. RN will report any adverse and/or side effects to prescribing provider.  Therapeutic Interventions: 1 on 1 counseling sessions, Psychoeducation, Medication administration, Evaluate responses to treatment, Monitor vital signs and CBGs as ordered, Perform/monitor CIWA, COWS, AIMS and Fall Risk screenings as ordered, Perform wound care treatments as ordered.  Evaluation of Outcomes: Progressing   LCSW Treatment Plan for Primary Diagnosis: Unspecified mood (affective) disorder (HCC) Long Term Goal(s): Safe transition to appropriate next level of care at discharge, Engage patient in therapeutic group addressing interpersonal concerns.  Short Term Goals: Engage patient in aftercare planning with referrals and resources, Identify triggers associated with mental health/substance abuse issues and Increase skills for wellness and recovery  Therapeutic Interventions: Assess for all discharge needs, 1 to 1 time with Social worker, Explore available resources and support systems, Assess for adequacy in community support network, Educate family and significant other(s)  on suicide prevention, Complete Psychosocial Assessment, Interpersonal group therapy.  Evaluation of Outcomes: Progressing   Progress in Treatment: Attending groups: No. Participating in groups: No. Taking medication as prescribed: Yes. Toleration medication: Yes. Family/Significant other contact made: Yes, individual(s) contacted:  CSW has made contact with pt's guardian, Joanne Johnston with [employer]. Patient understands diagnosis: No. Discussing patient identified problems/goals with staff: Yes. Medical problems stabilized or resolved: Yes. Denies suicidal/homicidal ideation: Yes. Issues/concerns per patient self-inventory: No. Other:    New problem(s) identified: Yes, Describe:  Patient has been having a few behavioral issues on the unit.  New Short Term/Long Term Goal(s):  Patient Goals:  To be discharged  Discharge Plan or Barriers: TBD.  CSW, Pt.s guardian and Joanne Johnston are continuing to explore appropriate residence options.  Pt may be more appropriate to go to an [facility] with CST. CSW waiting on status of patients referral to [facility]. CSW will continue to search for placement.  Reason for Continuation of Hospitalization: Other; describe Need to identify and secure appropriate residential placement for discharge.  Estimated Length of Stay: 7 days  Recreational Therapy: Patient Stressors: N/A Patient Goal: Patient will engage in interactions with peers and staff in pro-social manner at least 2x within 5 recreation therapy group sessions  Attendees: Patient: 12/26/2017 11:07 AM  Physician: Corinna Gab, MD 12/26/2017 11:07  AM  Nursing: Milas Hock, RN 12/26/2017 11:07 AM  RN Care Manager: 12/26/2017 11:07 AM  Social Worker: Joneen Roach, LCSWA 12/26/2017 11:07 AM  Recreational Therapist:  12/26/2017 11:07 AM  Other: Lowella Dandy, LCSW 12/26/2017 11:07 AM  Other:Harvey Beverely Pace, RHA 12/26/2017 11:07 AM  Other: Huey Romans, LCSW, Dr. Jennet Maduro, MD 12/26/2017 11:07 AM     Scribe for Treatment Team: Johny Shears, LCSW 12/26/2017 11:07 AM

## 2017-12-26 NOTE — Progress Notes (Signed)
Recreation Therapy Notes          Joanne Johnston 12/26/2017 11:15 AM

## 2017-12-26 NOTE — [facility] Group Notes (Signed)
Feelings Around Relapse 12/26/2017 1PM  Type of Therapy and Topic:  Group Therapy:  Feelings around Relapse and Recovery  Participation Level:  Did Not Attend   Description of Group:    Patients in this group will discuss emotions they experience before and after a relapse. They will process how experiencing these feelings, or avoidance of experiencing them, relates to having a relapse. Facilitator will guide patients to explore emotions they have related to recovery. Patients will be encouraged to process which emotions are more powerful. They will be guided to discuss the emotional reaction significant others in their lives may have to patients' relapse or recovery. Patients will be assisted in exploring ways to respond to the emotions of others without this contributing to a relapse.  Therapeutic Goals: 1. Patient will identify two or more emotions that lead to a relapse for them 2. Patient will identify two emotions that result when they relapse 3. Patient will identify two emotions related to recovery 4. Patient will demonstrate ability to communicate their needs through discussion and/or role plays   Summary of Patient Progress:     Therapeutic Modalities:   Cognitive Behavioral Therapy Solution-Focused Therapy Assertiveness Training Relapse Prevention Therapy   Suzan Slick, LCSW 12/26/2017 3:27 PM

## 2017-12-26 NOTE — Plan of Care (Signed)
Patient compliant with medications today.Patient is receptive when staff told to clean up the room.Appreciated patient for her behavior.Patient is loud when staff is making rounds states "I am here."Otherwise no outburst noted.Appetite and energy level good.

## 2017-12-27 NOTE — Progress Notes (Signed)
Patient denies SI/HI/AVH. Patient is without complaint. Patient refuses medication this morning. Patient is at time irritable. Patient is seen in milieu interacting with peers.

## 2017-12-27 NOTE — Progress Notes (Signed)
Patient alert and oriented x 3 with periods of confusion to situation, her affect is blunted , she appears less irritable, making eye contact and interacting appropriately with peers and staff. Patient is compliant with medication, receptive to treatment plan and she attended evening wrap up group. 15 minutes safety checks maintained, will continue to monitor.

## 2017-12-27 NOTE — [facility] Group Notes (Signed)
[facility] Group Notes:  (Nursing/MHT/Case Management/Adjunct)  Date:  12/27/2017  Time:  9:54 PM  Type of Therapy:  Group Therapy  Participation Level:  Active  Participation Quality:  Appropriate  Affect:  Appropriate  Cognitive:  Alert  Insight:  Good  Engagement in Group:  Supportive  Modes of Intervention:  Support  Summary of Progress/Problems:  Joanne Johnston 12/27/2017, 9:54 PM

## 2017-12-27 NOTE — Progress Notes (Signed)
[facility] MD Progress Note  12/27/2017 1:42 PM Joanne Johnston  MRN:  [id number]  Subjective:    Ms. Staubs does not engage in conversation but is not irritated with me either. Accepts medication and understands that we are doing our best to place her.   Principal Problem: Unspecified mood (affective) disorder ([place]) Diagnosis:   Patient Active Problem List   Diagnosis Date Noted  . 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 Unspecified mood (affective) disorder ([place]) [F39] 11/18/2017  . Agitation [R45.1] 11/04/2017   Total Time spent with patient: 15 minutes  Past Psychiatric History: dementia  Past Medical History:  Past Medical History:  Diagnosis Date  . Alcohol dependence ([place])   . Anxiety   . Dementia   . Depression   . Hypertension   . Tachycardia     Past Surgical History:  Procedure Laterality Date  . CESAREAN SECTION     x2   Family History: History reviewed. No pertinent family history. Family Psychiatric  History: see H&P Social History:  Social History   Substance and Sexual Activity  Alcohol Use Not Currently  . Frequency: Never   Comment: states hasn't had a drink in months     Social History   Substance and Sexual Activity  Drug Use Never    Social History   Socioeconomic History  . Marital status: Divorced    Spouse name: Not on file  . Number of children: Not on file  . Years of education: Not on file  . Highest education level: Not on file  Occupational History  . Not on file  Social Needs  . Financial resource strain: Not on file  . Food insecurity:    Worry: Not on file    Inability: Not on file  . Transportation needs:    Medical: Not on file    Non-medical: Not on file  Tobacco Use  . Smoking status: Never Smoker  . Smokeless tobacco: Never Used  Substance and Sexual Activity  . Alcohol use: Not Currently    Frequency: Never    Comment: states hasn't had  a drink in months  . Drug use: Never  . Sexual activity: Not Currently  Lifestyle  . Physical activity:    Days per week: Not on file    Minutes per session: Not on file  . Stress: Not on file  Relationships  . Social connections:    Talks on phone: Not on file    Gets together: Not on file    Attends religious service: Not on file    Active member of club or organization: Not on file    Attends meetings of clubs or organizations: Not on file    Relationship status: Not on file  Other Topics Concern  . Not on file  Social History Narrative  . Not on file   Additional Social History:                         Sleep: Fair  Appetite:  Fair  Current Medications: Current Facility-Administered Medications  Medication Dose Route Frequency Provider Last Rate Last Dose  . acetaminophen (TYLENOL) tablet 650 mg  650 mg Oral Q6H PRN Clapacs, John T, MD      . alum & mag hydroxide-simeth (MAALOX/MYLANTA) 200-200-20 MG/5ML suspension 30 mL  30 mL Oral Q4H PRN Clapacs, Madie [place], MD      . benztropine (  COGENTIN) tablet 1 mg  1 mg Oral Q6H PRN Lenward Chancellor, MD       Or  . benztropine mesylate (COGENTIN) injection 1 mg  1 mg Intramuscular Q6H PRN Lenward Chancellor, MD      . carbamazepine (TEGRETOL) tablet 300 mg  300 mg Oral BID McNew, Tyson Babinski, MD   300 mg at 12/26/17 2154  . haloperidol (HALDOL) tablet 5 mg  5 mg Oral Q6H PRN Lenward Chancellor, MD   5 mg at 11/22/17 1646   Or  . haloperidol lactate (HALDOL) injection 5 mg  5 mg Intramuscular Q6H PRN Lenward Chancellor, MD   5 mg at 12/18/17 1403  . LORazepam (ATIVAN) injection 1 mg  1 mg Intramuscular Q6H PRN He, Jun, MD   1 mg at 11/22/17 0208  . LORazepam (ATIVAN) tablet 1 mg  1 mg Oral Q6H PRN Lenward Chancellor, MD   1 mg at 11/22/17 1646   Or  . LORazepam (ATIVAN) injection 1 mg  1 mg Intramuscular Q6H PRN Lenward Chancellor, MD   1 mg at 12/18/17 1407  . magnesium hydroxide (MILK OF MAGNESIA) suspension 30 mL  30 mL Oral  Daily PRN Clapacs, John T, MD      . QUEtiapine (SEROQUEL) tablet 400 mg  400 mg Oral QHS McNew, Tyson Babinski, MD   400 mg at 12/26/17 2154  . thiamine (VITAMIN B-1) tablet 100 mg  100 mg Oral Daily Lenward Chancellor, MD   100 mg at 12/26/17 1229  . traZODone (DESYREL) tablet 100 mg  100 mg Oral QHS PRN He, Jun, MD   100 mg at 12/20/17 2129    Lab Results: No results found for this or any previous visit (from the past 48 hour(s)).  Blood Alcohol level:  Lab Results  Component Value Date   ETH <10 41/93/7902    Metabolic Disorder Labs: No results found for: HGBA1C, MPG No results found for: PROLACTIN No results found for: CHOL, TRIG, HDL, CHOLHDL, VLDL, LDLCALC  Physical Findings: AIMS: Facial and Oral Movements Muscles of Facial Expression: None, normal Lips and Perioral Area: None, normal Jaw: None, normal Tongue: None, normal,Extremity Movements Upper (arms, wrists, hands, fingers): None, normal Lower (legs, knees, ankles, toes): None, normal, Trunk Movements Neck, shoulders, hips: None, normal, Overall Severity Severity of abnormal movements (highest score from questions above): None, normal Incapacitation due to abnormal movements: None, normal Patient's awareness of abnormal movements (rate only patient's report): No Awareness, Dental Status Current problems with teeth and/or dentures?: No Does patient usually wear dentures?: No  CIWA:    COWS:     Musculoskeletal: Strength & Muscle Tone: within normal limits Gait & Station: normal Patient leans: N/A  Psychiatric Specialty Exam: Physical Exam  Nursing note and vitals reviewed. Psychiatric: She has a normal mood and affect. Her speech is normal and behavior is normal. Thought content normal. Cognition and memory are impaired. She expresses impulsivity.    Review of Systems  Neurological: Negative.   Psychiatric/Behavioral: Negative.   All other systems reviewed and are negative.   Blood pressure (!) 123/97, pulse 75,  temperature 98.3 F (36.8 C), temperature source Oral, resp. rate 17, height _0  (1.727 m), weight 80.7 kg, SpO2 96 %.Body mass index is 27.06 kg/m.  General Appearance: Casual  Eye Contact:  Good  Speech:  Clear and Coherent  Volume:  Normal  Mood:  Euthymic  Affect:  Labile  Thought Process:  Linear  Orientation:  Full (Time, Place, and Person)  Thought  Content:  WDL  Suicidal Thoughts:  No  Homicidal Thoughts:  No  Memory:  Immediate;   Fair Recent;   Fair Remote;   Fair  Judgement:  Poor  Insight:  Lacking  Psychomotor Activity:  Normal  Concentration:  Concentration: Fair and Attention Span: Fair  Recall:  [employer] of Knowledge:  Fair  Language:  Fair  Akathisia:  No  Handed:  Right  AIMS (if indicated):     Assets:  Communication Skills Desire for Improvement Financial Resources/Insurance Physical Health Resilience  ADL's:  Intact  Cognition:  WNL  Sleep:  Number of Hours: 7     Treatment Plan Summary: Daily contact with patient to assess and evaluate symptoms and progress in treatment and Medication management   Still waiting on placement to group home. No medication changes.   Plan:  Mood Continue Seroquel 400 mg qhs -Continue Tegretol 300 mg BID   Korsakoff's syndrome -Neurology was already consulted while in hospital in [facility] and workup completed including MRI, CT head, RPR. 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 She also has long history of heavy alcohol abuse and poor dietary intake. Her physical symptoms are improved significant since then and only persistent symptoms are short term memory loss ----MOCA on 9/27/19was 21/30 -PT evaluated and felt she was now independent with cares. -OT recommends supervised living due to poor short term memory issues and help with tracking day of the week and day to day management skills. This could be accomplished in an assisted  living or groups home living situation  Dispo -We had care conferencelast weekto discuss options for patient.CSW continues to look into group home placement   Orson Slick, MD 12/27/2017, 1:42 PM

## 2017-12-27 NOTE — Plan of Care (Signed)
Patient is calm and cooperative , seeing patient in the milieu area with peers interacting and playing cardboard games with out any issues, patient  verbally contract for safety of self and others. Compliant with medications , appetite is good , sleep long hours , voice no complain at this time , denies SI/HI AVH, 15 minute rounding is maintained no distress   Problem: Coping: Goal: Ability to identify and develop effective coping behavior will improve Outcome: Progressing Goal: Ability to interact with others will improve Outcome: Progressing Goal: Demonstration of participation in decision-making regarding own care will improve Outcome: Progressing Goal: Ability to use eye contact when communicating with others will improve Outcome: Progressing   Problem: Health Behavior/Discharge Planning: Goal: Identification of resources available to assist in meeting health care needs will improve Outcome: Progressing  .

## 2017-12-28 NOTE — Plan of Care (Signed)
Patient oriented to unit. Patient's safety is maintained on unit. Patient denies SI/HI/AVH.  Patient did not adhere with scheduled medication today. Patient had multiple outbursts of yelling and screaming at staff. Patient was in milieu with peers.    Problem: Coping: Goal: Ability to interact with others will improve Outcome: Progressing Goal: Ability to use eye contact when communicating with others will improve Outcome: Progressing   Problem: Coping: Goal: Ability to identify and develop effective coping behavior will improve Outcome: Not Progressing

## 2017-12-28 NOTE — Plan of Care (Signed)
Patient programed well in the unit activities. Pleasant and compliant with treatment.

## 2017-12-28 NOTE — [facility] Group Notes (Signed)
LCSW Group Therapy Note 12/28/2017 1:15pm  Type of Therapy and Topic: Group Therapy: Feelings Around Returning Home & Establishing a Supportive Framework and Supporting Oneself When Supports Not Available  Participation Level: Did Not Attend  Description of Group:  Patients first processed thoughts and feelings about upcoming discharge. These included fears of upcoming changes, lack of change, new living environments, judgements and expectations from others and overall stigma of mental health issues. The group then discussed the definition of a supportive framework, what that looks and feels like, and how do to discern it from an unhealthy non-supportive network. The group identified different types of supports as well as what to do when your family/friends are less than helpful or unavailable  Therapeutic Goals  1. Patient will identify one healthy supportive network that they can use at discharge. 2. Patient will identify one factor of a supportive framework and how to tell it from an unhealthy network. 3. Patient able to identify one coping skill to use when they do not have positive supports from others. 4. Patient will demonstrate ability to communicate their needs through discussion and/or role plays.  Summary of Patient Progress:  Pt was invited to attend group but chose not to attend. CSW will continue to encourage pt to attend group throughout their admission.    Therapeutic Modalities Cognitive Behavioral Therapy Motivational Interviewing   Jiyan Walkowski  [name], LCSW 12/28/2017 1:06 PM  

## 2017-12-28 NOTE — Progress Notes (Signed)
[facility] MD Progress Note  12/28/2017 4:44 PM Kahlea Cobert  MRN:  [id number]  Subjective:    Mr. Yono reorts no problems. Patiently awaiting placement.  Principal Problem: Unspecified mood (affective) disorder ([place]) Diagnosis:   Patient Active Problem List   Diagnosis Date Noted  . 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 Unspecified mood (affective) disorder ([place]) [F39] 11/18/2017  . Agitation [R45.1] 11/04/2017   Total Time spent with patient: 15 minutes  Past Psychiatric History: dementia  Past Medical History:  Past Medical History:  Diagnosis Date  . Alcohol dependence ([place])   . Anxiety   . Dementia   . Depression   . Hypertension   . Tachycardia     Past Surgical History:  Procedure Laterality Date  . CESAREAN SECTION     x2   Family History: History reviewed. No pertinent family history. Family Psychiatric  History: see H&P Social History:  Social History   Substance and Sexual Activity  Alcohol Use Not Currently  . Frequency: Never   Comment: states hasn't had a drink in months     Social History   Substance and Sexual Activity  Drug Use Never    Social History   Socioeconomic History  . Marital status: Divorced    Spouse name: Not on file  . Number of children: Not on file  . Years of education: Not on file  . Highest education level: Not on file  Occupational History  . Not on file  Social Needs  . Financial resource strain: Not on file  . Food insecurity:    Worry: Not on file    Inability: Not on file  . Transportation needs:    Medical: Not on file    Non-medical: Not on file  Tobacco Use  . Smoking status: Never Smoker  . Smokeless tobacco: Never Used  Substance and Sexual Activity  . Alcohol use: Not Currently    Frequency: Never    Comment: states hasn't had a drink in months  . Drug use: Never  . Sexual activity: Not Currently  Lifestyle  .  Physical activity:    Days per week: Not on file    Minutes per session: Not on file  . Stress: Not on file  Relationships  . Social connections:    Talks on phone: Not on file    Gets together: Not on file    Attends religious service: Not on file    Active member of club or organization: Not on file    Attends meetings of clubs or organizations: Not on file    Relationship status: Not on file  Other Topics Concern  . Not on file  Social History Narrative  . Not on file   Additional Social History:                         Sleep: Fair  Appetite:  Fair  Current Medications: Current Facility-Administered Medications  Medication Dose Route Frequency Provider Last Rate Last Dose  . acetaminophen (TYLENOL) tablet 650 mg  650 mg Oral Q6H PRN Clapacs, John T, MD      . alum & mag hydroxide-simeth (MAALOX/MYLANTA) 200-200-20 MG/5ML suspension 30 mL  30 mL Oral Q4H PRN Clapacs, John T, MD      . benztropine (COGENTIN) tablet 1 mg  1 mg Oral Q6H PRN Lenward Chancellor, MD       Or  .  benztropine mesylate (COGENTIN) injection 1 mg  1 mg Intramuscular Q6H PRN Lenward Chancellor, MD      . carbamazepine (TEGRETOL) tablet 300 mg  300 mg Oral BID McNew, Tyson Babinski, MD   300 mg at 12/27/17 2148  . haloperidol (HALDOL) tablet 5 mg  5 mg Oral Q6H PRN Lenward Chancellor, MD   5 mg at 11/22/17 1646   Or  . haloperidol lactate (HALDOL) injection 5 mg  5 mg Intramuscular Q6H PRN Lenward Chancellor, MD   5 mg at 12/18/17 1403  . LORazepam (ATIVAN) injection 1 mg  1 mg Intramuscular Q6H PRN He, Jun, MD   1 mg at 11/22/17 0208  . LORazepam (ATIVAN) tablet 1 mg  1 mg Oral Q6H PRN Lenward Chancellor, MD   1 mg at 11/22/17 1646   Or  . LORazepam (ATIVAN) injection 1 mg  1 mg Intramuscular Q6H PRN Lenward Chancellor, MD   1 mg at 12/18/17 1407  . magnesium hydroxide (MILK OF MAGNESIA) suspension 30 mL  30 mL Oral Daily PRN Clapacs, John T, MD      . QUEtiapine (SEROQUEL) tablet 400 mg  400 mg Oral QHS  McNew, Tyson Babinski, MD   400 mg at 12/27/17 2148  . thiamine (VITAMIN B-1) tablet 100 mg  100 mg Oral Daily Lenward Chancellor, MD   100 mg at 12/26/17 1229  . traZODone (DESYREL) tablet 100 mg  100 mg Oral QHS PRN He, Jun, MD   100 mg at 12/20/17 2129    Lab Results: No results found for this or any previous visit (from the past 48 hour(s)).  Blood Alcohol level:  Lab Results  Component Value Date   ETH <10 99/83/3825    Metabolic Disorder Labs: No results found for: HGBA1C, MPG No results found for: PROLACTIN No results found for: CHOL, TRIG, HDL, CHOLHDL, VLDL, LDLCALC  Physical Findings: AIMS: Facial and Oral Movements Muscles of Facial Expression: None, normal Lips and Perioral Area: None, normal Jaw: None, normal Tongue: None, normal,Extremity Movements Upper (arms, wrists, hands, fingers): None, normal Lower (legs, knees, ankles, toes): None, normal, Trunk Movements Neck, shoulders, hips: None, normal, Overall Severity Severity of abnormal movements (highest score from questions above): None, normal Incapacitation due to abnormal movements: None, normal Patient's awareness of abnormal movements (rate only patient's report): No Awareness, Dental Status Current problems with teeth and/or dentures?: No Does patient usually wear dentures?: No  CIWA:    COWS:     Musculoskeletal: Strength & Muscle Tone: within normal limits Gait & Station: normal Patient leans: N/A  Psychiatric Specialty Exam: Physical Exam  ROS  Blood pressure (!) 123/97, pulse 75, temperature 98.3 F (36.8 C), temperature source Oral, resp. rate 17, height _0  (1.727 m), weight 80.7 kg, SpO2 96 %.Body mass index is 27.06 kg/m.  General Appearance: Casual  Eye Contact:  Good  Speech:  Clear and Coherent  Volume:  Normal  Mood:  Euthymic  Affect:  Blunt  Thought Process:  Goal Directed and Descriptions of Associations: Intact  Orientation:  Full (Time, Place, and Person)  Thought Content:  WDL   Suicidal Thoughts:  No  Homicidal Thoughts:  No  Memory:  Immediate;   Fair Recent;   Fair Remote;   Fair  Judgement:  Poor  Insight:  Lacking  Psychomotor Activity:  Normal  Concentration:  Concentration: Fair and Attention Span: Fair  Recall:  [employer] of Knowledge:  Fair  Language:  Fair  Akathisia:  No  Handed:  Right  AIMS (if indicated):     Assets:  Communication Skills Desire for Improvement Financial Resources/Insurance Physical Health Resilience Social Support  ADL's:  Intact  Cognition:  WNL  Sleep:  Number of Hours: 7.45     Treatment Plan Summary: Daily contact with patient to assess and evaluate symptoms and progress in treatment and Medication management   Still waiting on placement to group home. No medication changes.   Plan:  Mood Continue Seroquel 400 mg qhs -Continue Tegretol 300 mg BID   Korsakoff's syndrome -Neurology was already consulted while in hospital in [facility] and workup completed including MRI, CT head, RPR. 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 She also has long history of heavy alcohol abuse and poor dietary intake. Her physical symptoms are improved significant since then and only persistent symptoms are short term memory loss ----MOCA on 9/27/19was 21/30 -PT evaluated and felt she was now independent with cares. -OT recommends supervised living due to poor short term memory issues and help with tracking day of the week and day to day management skills. This could be accomplished in an assisted living or groups home living situation  Dispo -We had care conferencelast weekto discuss options for patient.CSW continues to look into group home placement  Orson Slick, MD 12/28/2017, 4:44 PM

## 2017-12-28 NOTE — Progress Notes (Signed)
Vannary stayed in the milieu: was visible in the dayroom with staff and peers. Pleasant on approach. Alert and oriented and denying SI/HI/ and hallucinations. Lashonna attended group and remained focused. Had a snack and took medications voluntarily. Patient has not had any concern so far this shift. Was encouraged to talk to staff as needed. Support provided. Safety monitored at Q 15 mn level of observation.

## 2017-12-29 MED ORDER — CARBAMAZEPINE 200 MG PO TABS
400.0000 mg | ORAL_TABLET | Freq: Two times a day (BID) | ORAL | Status: DC
  Administered 2017-12-29 – 2018-01-02 (×8): 400 mg via ORAL
  Filled 2017-12-29 (×6): qty 2

## 2017-12-29 NOTE — Progress Notes (Signed)
[facility] MD Progress Note  12/29/2017 1:10 PM Joanne Johnston  MRN:  [id number] Subjective:  Pt had yelling outburst in her room. She is upset because she is still in the hospital "For dementia and alcohol." She did take oral Prn Haldol but was very upset about it. When lunch was announced, she stopped yelling. She was not aggressive or violent. She did take medications last night with no problem.   Principal Problem: Unspecified mood (affective) disorder ([place]) Diagnosis:   Patient Active Problem List   Diagnosis Date Noted  . Unspecified mood (affective) disorder ([place]) [F39] 11/18/2017    Priority: High  . 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 Agitation [R45.1] 11/04/2017   Total Time spent with patient: 15 minutes  Past Psychiatric History: See h&p  Past Medical History:  Past Medical History:  Diagnosis Date  . Alcohol dependence ([place])   . Anxiety   . Dementia   . Depression   . Hypertension   . Tachycardia     Past Surgical History:  Procedure Laterality Date  . CESAREAN SECTION     x2   Family History: History reviewed. No pertinent family history. Family Psychiatric  History: See H&P Social History:  Social History   Substance and Sexual Activity  Alcohol Use Not Currently  . Frequency: Never   Comment: states hasn't had a drink in months     Social History   Substance and Sexual Activity  Drug Use Never    Social History   Socioeconomic History  . Marital status: Divorced    Spouse name: Not on file  . Number of children: Not on file  . Years of education: Not on file  . Highest education level: Not on file  Occupational History  . Not on file  Social Needs  . Financial resource strain: Not on file  . Food insecurity:    Worry: Not on file    Inability: Not on file  . Transportation needs:    Medical: Not on file    Non-medical: Not on file  Tobacco Use  . Smoking  status: Never Smoker  . Smokeless tobacco: Never Used  Substance and Sexual Activity  . Alcohol use: Not Currently    Frequency: Never    Comment: states hasn't had a drink in months  . Drug use: Never  . Sexual activity: Not Currently  Lifestyle  . Physical activity:    Days per week: Not on file    Minutes per session: Not on file  . Stress: Not on file  Relationships  . Social connections:    Talks on phone: Not on file    Gets together: Not on file    Attends religious service: Not on file    Active member of club or organization: Not on file    Attends meetings of clubs or organizations: Not on file    Relationship status: Not on file  Other Topics Concern  . Not on file  Social History Narrative  . Not on file   Additional Social History:                         Sleep: Good  Appetite:  Good  Current Medications: Current Facility-Administered Medications  Medication Dose Route Frequency Provider Last Rate Last Dose  . acetaminophen (TYLENOL) tablet 650 mg  650 mg Oral Q6H PRN Clapacs, Madie Reno, MD      .  alum & mag hydroxide-simeth (MAALOX/MYLANTA) 200-200-20 MG/5ML suspension 30 mL  30 mL Oral Q4H PRN Clapacs, John T, MD      . benztropine (COGENTIN) tablet 1 mg  1 mg Oral Q6H PRN Lenward Chancellor, MD       Or  . benztropine mesylate (COGENTIN) injection 1 mg  1 mg Intramuscular Q6H PRN Lenward Chancellor, MD      . carbamazepine (TEGRETOL) tablet 400 mg  400 mg Oral BID Broden Holt R, MD      . haloperidol (HALDOL) tablet 5 mg  5 mg Oral Q6H PRN Lenward Chancellor, MD   5 mg at 12/29/17 1120   Or  . haloperidol lactate (HALDOL) injection 5 mg  5 mg Intramuscular Q6H PRN Lenward Chancellor, MD   5 mg at 12/18/17 1403  . LORazepam (ATIVAN) injection 1 mg  1 mg Intramuscular Q6H PRN He, Jun, MD   1 mg at 11/22/17 0208  . LORazepam (ATIVAN) tablet 1 mg  1 mg Oral Q6H PRN Lenward Chancellor, MD   1 mg at 12/29/17 1120   Or  . LORazepam (ATIVAN) injection 1 mg   1 mg Intramuscular Q6H PRN Lenward Chancellor, MD   1 mg at 12/18/17 1407  . magnesium hydroxide (MILK OF MAGNESIA) suspension 30 mL  30 mL Oral Daily PRN Clapacs, John T, MD      . QUEtiapine (SEROQUEL) tablet 400 mg  400 mg Oral QHS Gabrien Mentink, Tyson Babinski, MD   400 mg at 12/28/17 2136  . thiamine (VITAMIN B-1) tablet 100 mg  100 mg Oral Daily Lenward Chancellor, MD   100 mg at 12/29/17 1120  . traZODone (DESYREL) tablet 100 mg  100 mg Oral QHS PRN He, Jun, MD   100 mg at 12/20/17 2129    Lab Results: No results found for this or any previous visit (from the past 48 hour(s)).  Blood Alcohol level:  Lab Results  Component Value Date   ETH <10 54/65/0354    Metabolic Disorder Labs: No results found for: HGBA1C, MPG No results found for: PROLACTIN No results found for: CHOL, TRIG, HDL, CHOLHDL, VLDL, LDLCALC  Physical Findings: AIMS: Facial and Oral Movements Muscles of Facial Expression: None, normal Lips and Perioral Area: None, normal Jaw: None, normal Tongue: None, normal,Extremity Movements Upper (arms, wrists, hands, fingers): None, normal Lower (legs, knees, ankles, toes): None, normal, Trunk Movements Neck, shoulders, hips: None, normal, Overall Severity Severity of abnormal movements (highest score from questions above): None, normal Incapacitation due to abnormal movements: None, normal Patient's awareness of abnormal movements (rate only patient's report): No Awareness, Dental Status Current problems with teeth and/or dentures?: No Does patient usually wear dentures?: No  CIWA:    COWS:     Musculoskeletal: Strength & Muscle Tone: within normal limits Gait & Station: normal Patient leans: N/A  Psychiatric Specialty Exam: Physical Exam  Nursing note and vitals reviewed.   Review of Systems  All other systems reviewed and are negative.   Blood pressure (!) 123/97, pulse 75, temperature 98.3 F (36.8 C), temperature source Oral, resp. rate 17, height _0  (1.727 m),  weight 80.7 kg, SpO2 96 %.Body mass index is 27.06 kg/m.  General Appearance: Casual  Eye Contact:  Minimal  Speech:  Loud, yelling  Volume:  Increased  Mood:  Irritable  Affect:  Labile  Thought Process:  Coherent and Goal Directed  Orientation:  Full (Time, Place, and Person)  Thought Content:  Logical  Suicidal Thoughts:  No  Homicidal  Thoughts:  No  Memory:  Immediate;   Poor  Judgement:  Impaired  Insight:  Lacking  Psychomotor Activity:  Normal  Concentration:  Concentration: Poor  Recall:  Poor  Fund of Knowledge:  Fair  Language:  Fair  Akathisia:  No      Assets:  Resilience  ADL's:  Intact  Cognition:  Impaired,  Moderate  Sleep:  Number of Hours: 6.45     Treatment Plan Summary: 47 yo female admitted due to yelling outbursts. She continues to have some yelling outbursts at times but able to calm herself down usually. She was yelling this morning about still being in the hospital and is very frustrated by this. She did take oral Prn medications. She calmed down immediately when lunch was announced.   Plan:  Mood -Increase Tegretol to 400 mg BID -Continue Seroquel 400 mg qhs  Korsakoff's syndrome -Neurology was already consulted while in hospital in [place] and workup completed including MRI, CT head, RPR. 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 She also has long history of heavy alcohol abuse and poor dietary intake. Her physical symptoms are improved significant since then and only persistent symptoms are short term memory loss ----MOCA on 9/27/19was 21/30 -PT evaluated and felt she was now independent with cares. -OT recommends supervised living due to poor short term memory issues and help with tracking day of the week and day to day management skills. This could be accomplished in an assisted living or groups home living situation  Dispo -We had care conferencelast weekto discuss  options for patient.CSW continues to look into group home placement   Marylin Crosby, MD 12/29/2017, 1:10 PM

## 2017-12-29 NOTE — Progress Notes (Signed)
DAR Note: Pt woke up very agitated this morning. Observed yelling and screaming "I have alcohol dementia bitch, you guys just want to lock me up in this place and some where else, don't you understand, you fucking bitch, that's not where I need to be". Pt took her medications with increased prompts, threw water and medication cups at staff "now get the fuck out". Scheduled and PRN medications given to pt as ordered. Emotional support and availability offered. Encouraged pt to voice concerns, attend to ADLs and comply with current treatment regimen including groups. Q 15 minutes safety checks maintained.  Pt calm at this time. Visible in milieu and on phone. Pleasant on interactions at this time. Tolerated all PO intake well when offered. Remains safe on unit.

## 2017-12-29 NOTE — BHH Counselor (Signed)
CSW called the following places regarding bed placement for the patient.  1. [facility] (6) [facility] [address]; [place], [place] [address] [phone] Fax: [phone]- number disconnected 2. [facility] [phone], [phone]- Oneal Grout. She reports that she has female beds and requested information. CSW sent over [facility] and progress note. Fax [phone]. 3. [facility] (4) [facility] [address]; [place], [place] [address] [phone] Fax: [phone]- CSW left a voicemail. 4. [facility] (6) [facility] [address]; [place], [place] [address] [phone] Fax: [phone]- Administrator, [phone] [place] Poli- She would like to come and interview the patient on Wednesday 12/31/2017 at 1PM.  5. [facility] in [place]- CSW left a voicemail. 6. [facility] (6) [address] [address]; [place], [place] [address] [phone] Fax: [phone]- Number not valid 7. [facility] (6) [facility] [address] [place], [place] [address] [phone] Fax: [phone]-No female beds available 8. [facility] (6) [facility] [address] [place], [place] [address] [phone] Fax: [phone]- No female beds available. 9. [facility] (6) Emmit Pomfret [address] [place], [place] [address] [phone] Fax: [phone]- Unable to leave a voicemail 10. [facility] (6) Alfreda [facility] [address] [place], [place] [address] [phone] Fax: [phone]- Number is disconnected 11. [facility] (6) [facility] [address]; [place], [place] [address] [phone] Fax: [phone] -No female beds avalible 12. [facility] (6) Emmit Pomfret [address]; [place], [place] [address] [phone] Fax: [phone], CSW left her number for the  administrator to give a call back. 13. [facility] (6) Len Childs [address]; [place], [place] [address] [phone] Fax: [phone] 14. [facility] (6) [facility] [address] [place], [place] [address] [phone] Fax: [phone]- Number continued to ring. Unable to leave voicemail. 15. [facility] (5) [facility] [address] [place], [place] [address] [phone] Fax: [phone]- Number not working 16. [facility]- No female beds at this time.  Johny Shears, MSW, [facility] Lions Clinical Social Worker 12/29/2017 4:01 PM

## 2017-12-29 NOTE — BHH Counselor (Signed)
CSW spoke with Clydie Braun with [facility]. She reports that she spoke with Mrs. Woods regarding the patient and that Mrs. Mellody Life from [facility] will be accepting the patient. She reports that the patient can be picked up as soon as tomorrow afternoon. CSW will call and confirm discharge plan first thing in the morning.   Johny Shears, MSW, [facility]on Lions Clinical Social Worker 12/29/2017 4:02 PM

## 2017-12-29 NOTE — BHH Group Notes (Signed)
BHH Group Notes:  (Nursing/MHT/Case Management/Adjunct)  Date:  12/29/2017  Time:  9:21 PM  Type of Therapy:  Group Therapy  Participation Level:  Active  Participation Quality:  Appropriate  Affect:  Appropriate  Cognitive:  Appropriate  Insight:  Appropriate  Engagement in Group:  Engaged  Modes of Intervention:  Discussion  Summary of Progress/Problems:  Burt Ek 12/29/2017, 9:21 PM

## 2017-12-29 NOTE — BHH Counselor (Signed)
CSW called patients guardian Monico Hoar ([phone]) to discuss discharge plan. CSW left a voicemail with a callback number. CSW called Cloyd Stagers SW Supervisor with [facility] [phone]) to discuss patients discharge plans. Mrs. Woods from [facility] had inquired about the patients pay being upfront in the form of a check. CSW talked to Mrs. Smith regarding this. She asked several questions including the type of placement which is a [facility]. She asked the name and the administrator of the home. She quickly hesitated when CSW told her this is a [facility].  CSW provided all information to her. She reports that she will need to consult the finance department and her supervisor regarding the patients discharge plan before making a decision on the placement. CSW gave Mrs. Katrinka Blazing her number to call back once everything has been verified. She was unable to provide a time frame on the answer. CSW will continue to follow up.    CSW reached out to [facility] to provide them with an update.  Johny Shears, MSW, Theresia Majors, Bridget Hartshorn Clinical Social Worker 12/29/2017 4:21 PM

## 2017-12-30 MED ORDER — CARBAMAZEPINE 200 MG PO TABS: 400.0000 mg | ORAL_TABLET | Freq: Two times a day (BID) | ORAL | 1 refills | Status: AC

## 2017-12-30 MED ORDER — QUETIAPINE FUMARATE 400 MG PO TABS: 400.0000 mg | ORAL_TABLET | Freq: Every day | ORAL | 1 refills | Status: AC

## 2017-12-30 MED ORDER — CARBAMAZEPINE 200 MG PO TABS: 400.0000 mg | ORAL_TABLET | Freq: Two times a day (BID) | ORAL | 1 refills | Status: DC

## 2017-12-30 MED ORDER — QUETIAPINE FUMARATE 400 MG PO TABS: 400.0000 mg | ORAL_TABLET | Freq: Every day | ORAL | 1 refills | Status: DC

## 2017-12-30 NOTE — BHH Group Notes (Signed)
[facility] Group Notes:  (Nursing/MHT/Case Management/Adjunct)  Date:  12/30/2017  Time:  9:52 AM  Type of Therapy:  Psychoeducational Skills  Participation Level:  Did Not Attend  Joanne Johnston 12/30/2017, 9:52 AM

## 2017-12-30 NOTE — Progress Notes (Signed)
Joanne Johnston attended group therapy and was active/appropriate. Stayed in the dayroom for a while, watching TV , mostly. Had a snack and received bedtime medication. Became anxious and started crying, saying that "they took me off my medication and put me one things I don't know about....this so stressful". Expressed helplessness. Was restless with increasing anxiety. Received Ativan  At 21:01 along with scheduled medications then fell asleep later. Patient has remained asleep and has no sign of discomfort. Staff continue to monitor for safety.

## 2017-12-30 NOTE — Plan of Care (Signed)
Remained cooperative and expressed her needs cooperatively

## 2017-12-30 NOTE — BHH Counselor (Signed)
CSW spoke with patients guardian Monico Hoar [phone]) to discuss discharge plan. Lafonda Mosses reports that she was told to call the CSW and inform her that her boss says that that plan is not acceptable for the patient. CSW spoke with the guardian about the case and the recommendations of the hospital and [facility]. She reports that she understood where we were coming from and that she will provide this information to her boss to see if they are willing to reconsider. She reports that she will call the CSW back once she speaks with them. CSW staffed discharge plan update with the treatment team.  Johny Shears, MSW, Bryon Lions Clinical Social Worker 12/30/2017 11:52 AM

## 2017-12-30 NOTE — Plan of Care (Signed)
D: Pt denies SI/HI/AV hallucinations. Pt is pleasant and cooperative mood this morning. Pt has been in milieu for meals. Patient voices no concerns. A: Pt was offered support and encouragement. Pt was given scheduled medications. Pt was encourage to attend groups. Q 15 minute checks were done for safety.  R:Pt attends groups and interacts well with peers and staff. Pt is taking medication. Pt has no complaints.Pt receptive to treatment and safety maintained on unit.    Problem: Coping: Goal: Ability to identify and develop effective coping behavior will improve Outcome: Progressing Goal: Ability to use eye contact when communicating with others will improve Outcome: Progressing

## 2017-12-30 NOTE — Progress Notes (Signed)
Recreation Therapy Notes  Date: 12/30/2017  Time: 9:30 am   Location: Craft room   Behavioral response: N/A   Intervention Topic: Goals  Discussion/Intervention: Patient did not attend group.   Clinical Observations/Feedback:  Patient did not attend group.   [name] LRT/CTRS        Dellas Guard 12/30/2017 11:13 AM

## 2017-12-30 NOTE — Progress Notes (Addendum)
[facility] MD Progress Note  12/30/2017 11:23 AM Joanne Johnston  MRN:  [id number]   Subjective:  Pt has been taking her medications. No further outbursts noted by staff. She was crying yesterday due to being in the hospital still.  She is interacting well with other peers and observed laughing and talking with another peer here. She is sleeping well and eating well. Upon evaluation today, she is in great mood. She is smiling. She had a very appropriate conversation. She had sense of humor. She has been very patient with the placement process. She is hoping to get out of the hospital soon to an unlocked place. She is oriented. Her hygiene is good. She is very calm this afternoon.   Principal Problem: Unspecified mood (affective) disorder ([place]) Diagnosis:   Patient Active Problem List   Diagnosis Date Noted  . Unspecified mood (affective) disorder ([place]) [F39] 11/18/2017    Priority: High  . 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 Her family estimates she would drink at least a bottle or two of wine a day and not eat for several days. She will be linked to [facility] on discharge as she does not have any mental health providers currently.   :I did more chart review from her hospitalization at [facility] from [place] 2019. It appears she was very confused at the initial part of that hospitalization. Physical therapy did evaluation at that time and recommend SNF at that time due to very unsteady gait and inability to care for herself at that time. Unclear why it progressed into needed locked memory care unit. It also appears the last psychiatric evaluation was in June 2019 and they agreed with diagnosis of Korsakoff syndrome. I  did not see any sort of placement recommendation from them other than due to cognitive issues they felt she was at high risk to live independently. Her physical symptoms and even cognition has improved significantly since then.   Timeline of events:  Hospitalized from April 2019 until September-brought in by bf intoxicated, confused, and very unstable on gait-neurology and psychiatry both consulted and diagnosed her with Gus Puma Korsakoff with behaviors issues (medical/neurological diagnosis). CT and MRI head completed which were both normal. RPR negative, Full workup done.   Long history of alcohol abuse-per family, she used to drink "constantly" and had very poor diet. She has not had anything to drink for several months. She was started on Seroquel during last hospitalization for mood stability.  On 11/21/17,We discussed her memory deficits. She states that she has trouble with short term things. She states, "my long term memory is great." She sat down with me and we did [place] together. She scored 21/30 but this score could have been higher as she was not familiar with this hospital as she was brought here by ambulance. The main deficit was in delayed recall. She did really well on the other sections including visuospatial and executive functioning.  Reviewed e-mail correspondence with her case manger with [facility]. It appears there has been some consultation done with a physical through them. They recommended a neurology consult for workup of other etiologies such as neurosyphilis, NMDA. Neurology was consulted during her previous extended hospitalization. Given her long history of alcohol use (2 bottles  of wine or 1/5 of vodka a day) and very poor oral intake as well as presentation of unsteady gait, amnesia and ataxia, she was diagnosed with Wernicke-Korsakoff. MRI brain and CT done were done at that time and unremarkable. RPR done as well which was negative so this rules out neurosyphilis.  She has no symptoms at all suggestive of anti-NMDA that warrant work up for this (including afebrile, vitals have been stable, no psychosis at all). It was also noted that there was agreement that a secure nursing facility is not the ideal setting for her and would be more successful in group home/ALF setting with community supports as restrictive setting contributed to some of the behavioral issues.  She was [facility] and is not appropriate for state psychiatric hospital.  Past Medical History:  Past Medical History:  Diagnosis Date  . Alcohol dependence ([place])   . Anxiety   . Dementia   . Depression   . Hypertension   . Tachycardia     Past Surgical History:  Procedure Laterality Date  . CESAREAN SECTION     x2   Family History: History reviewed. No pertinent family history. Family Psychiatric  History: See H&P Social History:  Social History   Substance and Sexual Activity  Alcohol Use Not Currently  . Frequency: Never   Comment: states hasn't had a drink in months     Social History   Substance and Sexual Activity  Drug Use Never    Social History   Socioeconomic History  . Marital status: Divorced    Spouse name: Not on file  . Number of children: Not on file  . Years of education: Not on file  . Highest education level: Not on file  Occupational History  . Not on file  Social Needs  . Financial resource strain: Not on file  . Food insecurity:    Worry: Not on file    Inability: Not on file  . Transportation needs:    Medical: Not on file    Non-medical: Not on file  Tobacco Use  . Smoking status: Never Smoker  . Smokeless tobacco: Never Used  Substance and Sexual Activity  . Alcohol use: Not Currently    Frequency: Never    Comment: states hasn't had a drink in months  . Drug use: Never  . Sexual activity: Not Currently  Lifestyle  . Physical activity:    Days per week: Not on file    Minutes per session:  Not on file  . Stress: Not on file  Relationships  . Social connections:    Talks on phone: Not on file    Gets together: Not on file    Attends religious service: Not on file    Active member of club or organization: Not on file    Attends meetings of clubs or organizations: Not on file    Relationship status: Not on file  Other Topics Concern  . Not on file  Social History Narrative  . Not on file   Additional Social History:                         Sleep: Good  Appetite:  Good  Current Medications: Current Facility-Administered Medications  Medication Dose Route Frequency Provider Last Rate Last Dose  . acetaminophen (TYLENOL) tablet 650 mg  650 mg Oral Q6H PRN Clapacs, Madie Reno, MD      . alum & mag  hydroxide-simeth (MAALOX/MYLANTA) 200-200-20 MG/5ML suspension 30 mL  30 mL Oral Q4H PRN Clapacs, John T, MD      . benztropine (COGENTIN) tablet 1 mg  1 mg Oral Q6H PRN Lenward Chancellor, MD       Or  . benztropine mesylate (COGENTIN) injection 1 mg  1 mg Intramuscular Q6H PRN Lenward Chancellor, MD      . carbamazepine (TEGRETOL) tablet 400 mg  400 mg Oral BID ,  R, MD   400 mg at 12/30/17 4259  . haloperidol (HALDOL) tablet 5 mg  5 mg Oral Q6H PRN Lenward Chancellor, MD   5 mg at 12/29/17 1120   Or  . haloperidol lactate (HALDOL) injection 5 mg  5 mg Intramuscular Q6H PRN Lenward Chancellor, MD   5 mg at 12/18/17 1403  . LORazepam (ATIVAN) injection 1 mg  1 mg Intramuscular Q6H PRN He, Jun, MD   1 mg at 11/22/17 0208  . LORazepam (ATIVAN) tablet 1 mg  1 mg Oral Q6H PRN Lenward Chancellor, MD   1 mg at 12/29/17 2101   Or  . LORazepam (ATIVAN) injection 1 mg  1 mg Intramuscular Q6H PRN Lenward Chancellor, MD   1 mg at 12/18/17 1407  . magnesium hydroxide (MILK OF MAGNESIA) suspension 30 mL  30 mL Oral Daily PRN Clapacs, John T, MD      . QUEtiapine (SEROQUEL) tablet 400 mg  400 mg Oral QHS , Tyson Babinski, MD   400 mg at 12/29/17 2058  . thiamine (VITAMIN B-1)  tablet 100 mg  100 mg Oral Daily Lenward Chancellor, MD   100 mg at 12/30/17 5638  . traZODone (DESYREL) tablet 100 mg  100 mg Oral QHS PRN He, Jun, MD   100 mg at 12/20/17 2129    Lab Results: No results found for this or any previous visit (from the past 48 hour(s)).  Blood Alcohol level:  Lab Results  Component Value Date   ETH <10 75/64/3329    Metabolic Disorder Labs: No results found for: HGBA1C, MPG No results found for: PROLACTIN No results found for: CHOL, TRIG, HDL, CHOLHDL, VLDL, LDLCALC  Physical Findings: AIMS: Facial and Oral Movements Muscles of Facial Expression: None, normal Lips and Perioral Area: None, normal Jaw: None, normal Tongue: None, normal,Extremity Movements Upper (arms, wrists, hands, fingers): None, normal Lower (legs, knees, ankles, toes): None, normal, Trunk Movements Neck, shoulders, hips: None, normal, Overall Severity Severity of abnormal movements (highest score from questions above): None, normal Incapacitation due to abnormal movements: None, normal Patient's awareness of abnormal movements (rate only patient's report): No Awareness, Dental Status Current problems with teeth and/or dentures?: No Does patient usually wear dentures?: No  CIWA:    COWS:     Musculoskeletal: Strength & Muscle Tone: within normal limits Gait & Station: normal Patient leans: N/A  Psychiatric Specialty Exam: Physical Exam  Nursing note and vitals reviewed.   Review of Systems  All other systems reviewed and are negative.   Blood pressure 103/86, pulse 86, temperature 98 F (36.7 C), temperature source Oral, resp. rate 17, height '5\' 8"'  (1.727 m), weight 80.7 kg, SpO2 100 %.Body mass index is 27.06 kg/m.  General Appearance: Casual  Eye Contact:  Good  Speech:  Clear and Coherent  Volume:  Normal  Mood:  Euthymic  Affect:  Labile  Thought Process:  Coherent and Goal Directed  Orientation:  Full (Time, Place, and Person)  Thought Content:  Logical   Suicidal Thoughts:  No  Homicidal  Thoughts:  No  Memory:  Immediate;   Poor  Judgement:  Impaired  Insight:  Lacking  Psychomotor Activity:  Normal  Concentration:  Concentration: Poor  Recall:  Poor  Fund of Knowledge:  Fair  Language:  Fair  Akathisia:  No      Assets:  Resilience  ADL's:  Intact  Cognition:  Impaired,  Moderate  Sleep:  Number of Hours: 7.45     Treatment Plan Summary: 47 yo female admitted due to agitation at memory care unit.  Pt has had significant improvement in behavaiors and decreased frequency in verbal outbursts. When she does have a verbal outburst, she is able to calm herself down after a very short period of time. The outbursts are only in response to being very frustrated with being in the hospital and lack of freedom and not having access to any of her belongings. She has not been violent or aggressive at all.  She interacts very appropriately with other peers. I have been able to have great conversations with her. She is overall organized in her thoughts with no psychosis.  I have been trying to involve her in her care as much as possibleand she responds very well to this. She expresses her frustration with being locked up in a hospital and most recently in a memory care unit with people not close to her age. She has had some difficulty and frustration with change in independence. MOCA was done on 9/27showed some difficulty with delayed recall but overall she did well. She scored 21/30. Myself, her treatment team at [facility], and care team at [facility] strongly feel that a locked memory care unit was too restrictive level of care for her and would benefitmuch more byunlocked facility but withsupervisionwhich she is very open to.I don't feel she can live fully independently at this time due to cognitive deficits.I strongly feel that a locked memory care unit was much too restrictive level of care for her and decompensated because of this. This has not  worked for her in the recent past and not likely to bebeneficial for herin the future. She is higher functioning and has been adequately able to care for herself and her ADLs. I wouldn't classifyher memory issues as "severe" and more moderate deficitin short term memory.I think she would benefitmore from an assisted living type environment or group home setting/[facility] and this would allow her moreindependenceand allow her to thrive. She could benefit from some supervision of medicationsand orientation. Physical therapy and occupational therapy both did evaluations/. Physical therapy feels she does not need any further treatment and physical symptoms have improved significantly.  Occupational therapy recommends some supervised living due to short term memory deficits but also feels locked facility is not likely appropriate. I have been in touch with her father who also would like her to be in a more independence living place and also does not feel that any locked facility is appropriate.Pt has not had a chance to try a lower level of care and has been institutionalized for the past several months which is completely unfair to her. She requires a chance to attempt a lower level of care with supervision. She has been compliant with her medicationsoverall but does requires some encouragement at times.CSW has found a very appropriate placement for patient at a FCHwhich would be a much less restrictive level of care for her but provide some supervision and support.Outburstsare improving and much less frequent.  Plan:  Mood Continue Tegretol 400 mg BID -Continue Seroquel  400 mg qhs  Korsakoff's syndrome -Neurology was already consulted while in hospital in [facility] and workup completed including MRI, CT head, RPR. 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 She also has long history of heavy alcohol abuse and poor dietary intake. Her  physical symptoms are improved significant since then and only persistent symptoms are short term memory loss ----MOCA on 9/27/19was 21/30 -PT evaluated and felt she was now independent with cares. -OT recommends supervised living due to poor short term memory issues and help with tracking day of the week and day to day management skills. This could be accomplished in an assisted living or groups home living situation  Dispo -Per her treatment team at [facility] (which includes myself who is her psychiatrist, her CSW, and nursing staff, as well as recommendations from physical therapy and occupational therapy), a [place] or Group home is assessed to be most appropriate level of care at this time. CSW has found a supervised living facility willing to take her. There is some hesitancy with her guardians at [place] due to her "behaviors."  Discussed with them that [facility] are equipped to deal with people the behavioral issues. Also, her behavioral issues have improved significantly. She does not meet criteria for any other level of care at this time. Our goal is least restrictive level of care. Any locked facility is inappropriate and much too restrictive for her at this time. Furthermore, she is likely to have more behaviors issues and acting out at any locked or restrictive facility. She has not had chance to attempt any lower level of care and has been in locked facility since April 2019 (hospitalized at [facility], went to [facility] memory care unit, and then immediately to [facility]). Prior to this she was living independently.   Marylin Crosby, MD 12/30/2017, 11:23 AM

## 2017-12-31 NOTE — Plan of Care (Signed)
Patient had fair eye contact with this writer today during assessment and medication administration. Patient was seen in the day room interacting appropriately with staff and peers.   Problem: Coping: Goal: Ability to interact with others will improve Outcome: Progressing Goal: Ability to use eye contact when communicating with others will improve Outcome: Progressing

## 2017-12-31 NOTE — Progress Notes (Signed)
D: Patient denies SI/HI/AVH, she is pleasant and cooperative, no distress noted, thoughts are organized and coherent Patient appears less anxious and she is interacting with peers and staff appropriately.  A: Patient was offered support and encouragement, and was given scheduled medications. Patient was encouraged to attend groups, 15 minutes checks were done for safety.  R:Patient ttends groups and interacts appropriately with peers and staff, she is complaint with medication.and  receptive to treatment. 15 minutes  safety maintained on unit.

## 2017-12-31 NOTE — Progress Notes (Signed)
D - Patient denies SI/HI/AVH and contracted for safety with this Clinical research associate. Patient stated that her day was "ok" but was engaged in conversation with this Clinical research associate. Patient denies anxiety, depression and pain. Patient was in the day room most of the evening interacting with peers and staff appropriately.   A - Medications administered to patient per MD orders. Education given. Support and encouragement provided. Routine safety checks conducted Q 15 minutes per unit protocol. Patient informed to notify staff with any problems.   R - Patient was compliant with medications and treatment plan. Patient receptive, calm and cooperative on the unit, during medication administration and during assessment. Patient remains safe at this time.

## 2017-12-31 NOTE — Plan of Care (Signed)
Patient has been observed in the milieu throughout the day interacting well with staff and other members on the unit without any issues. Patient has been participating in the decision-making process regarding her health-care. Patient uses fair eye contact when communicating with this Clinical research associate. Patient remains safe on the unit at this time.  Problem: Coping: Goal: Ability to identify and develop effective coping behavior will improve Outcome: Progressing Goal: Ability to interact with others will improve Outcome: Progressing Goal: Demonstration of participation in decision-making regarding own care will improve Outcome: Progressing Goal: Ability to use eye contact when communicating with others will improve Outcome: Progressing   Problem: Health Behavior/Discharge Planning: Goal: Identification of resources available to assist in meeting health care needs will improve Outcome: Progressing

## 2017-12-31 NOTE — Progress Notes (Signed)
D- Patient alert and oriented. Patient presents in a pleasant mood on assessment reporting that she slept "fair" last night and had no major complaints or concerns to voice to this Clinical research associate. Patient denies SI, HI, AVH, and pain at this time via her self-inventory. Patient also rates her depression and anxiety via her self-inventory a "5/10", however, she denies signs/symptoms to this Clinical research associate. Patient's goal for today is "memory" in which she will "concentrate" in order to accomplish this goal.  A- Scheduled medications administered to patient, per MD orders. Support and encouragement provided.  Routine safety checks conducted every 15 minutes.  Patient informed to notify staff with problems or concerns.  R- No adverse drug reactions noted. Patient contracts for safety at this time. Patient compliant with medications and treatment plan. Patient receptive, calm, and cooperative. Patient interacts well with others on the unit.  Patient remains safe at this time.

## 2017-12-31 NOTE — Progress Notes (Signed)
[facility] MD Progress Note  12/31/2017 1:35 PM Joanne Johnston  MRN:  [id number] Subjective:  Pt continues to be in pleasant mood today. She is in the day room watching TV. We discussed that there has been placement found but waiting on the funding. She is happy about this and is excited to go somewhere not locked. She is taking medications with no issues. No outbursts noted. She is sleeping well. She is eating well. She if fully oriented today.   Principal Problem: Unspecified mood (affective) disorder ([place]) Diagnosis:   Patient Active Problem List   Diagnosis Date Noted  . Unspecified mood (affective) disorder ([place]) [F39] 11/18/2017    Priority: High  . 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 Agitation [R45.1] 11/04/2017   Total Time spent with patient: 15 minutes  Past Psychiatric History: See h&P  Past Medical History:  Past Medical History:  Diagnosis Date  . Alcohol dependence ([place])   . Anxiety   . Dementia   . Depression   . Hypertension   . Tachycardia     Past Surgical History:  Procedure Laterality Date  . CESAREAN SECTION     x2   Family History: History reviewed. No pertinent family history. Family Psychiatric  History: See H&P Social History:  Social History   Substance and Sexual Activity  Alcohol Use Not Currently  . Frequency: Never   Comment: states hasn't had a drink in months     Social History   Substance and Sexual Activity  Drug Use Never    Social History   Socioeconomic History  . Marital status: Divorced    Spouse name: Not on file  . Number of children: Not on file  . Years of education: Not on file  . Highest education level: Not on file  Occupational History  . Not on file  Social Needs  . Financial resource strain: Not on file  . Food insecurity:    Worry: Not on file    Inability: Not on file  . Transportation needs:    Medical: Not on file   Non-medical: Not on file  Tobacco Use  . Smoking status: Never Smoker  . Smokeless tobacco: Never Used  Substance and Sexual Activity  . Alcohol use: Not Currently    Frequency: Never    Comment: states hasn't had a drink in months  . Drug use: Never  . Sexual activity: Not Currently  Lifestyle  . Physical activity:    Days per week: Not on file    Minutes per session: Not on file  . Stress: Not on file  Relationships  . Social connections:    Talks on phone: Not on file    Gets together: Not on file    Attends religious service: Not on file    Active member of club or organization: Not on file    Attends meetings of clubs or organizations: Not on file    Relationship status: Not on file  Other Topics Concern  . Not on file  Social History Narrative  . Not on file   Additional Social History:                         Sleep: Good  Appetite:  Good  Current Medications: Current Facility-Administered Medications  Medication Dose Route Frequency Provider Last Rate Last Dose  . acetaminophen (TYLENOL) tablet 650 mg  650 mg Oral  Q6H PRN Clapacs, Madie Reno, MD      . alum & mag hydroxide-simeth (MAALOX/MYLANTA) 200-200-20 MG/5ML suspension 30 mL  30 mL Oral Q4H PRN Clapacs, John T, MD      . benztropine (COGENTIN) tablet 1 mg  1 mg Oral Q6H PRN Lenward Chancellor, MD       Or  . benztropine mesylate (COGENTIN) injection 1 mg  1 mg Intramuscular Q6H PRN Lenward Chancellor, MD      . carbamazepine (TEGRETOL) tablet 400 mg  400 mg Oral BID Deema Juncaj R, MD   400 mg at 12/30/17 2202  . haloperidol (HALDOL) tablet 5 mg  5 mg Oral Q6H PRN Lenward Chancellor, MD   5 mg at 12/29/17 1120   Or  . haloperidol lactate (HALDOL) injection 5 mg  5 mg Intramuscular Q6H PRN Lenward Chancellor, MD   5 mg at 12/18/17 1403  . LORazepam (ATIVAN) injection 1 mg  1 mg Intramuscular Q6H PRN He, Jun, MD   1 mg at 11/22/17 0208  . LORazepam (ATIVAN) tablet 1 mg  1 mg Oral Q6H PRN Lenward Chancellor,  MD   1 mg at 12/29/17 2101   Or  . LORazepam (ATIVAN) injection 1 mg  1 mg Intramuscular Q6H PRN Lenward Chancellor, MD   1 mg at 12/18/17 1407  . magnesium hydroxide (MILK OF MAGNESIA) suspension 30 mL  30 mL Oral Daily PRN Clapacs, John T, MD      . QUEtiapine (SEROQUEL) tablet 400 mg  400 mg Oral QHS Alexius Ellington, Tyson Babinski, MD   400 mg at 12/30/17 2202  . thiamine (VITAMIN B-1) tablet 100 mg  100 mg Oral Daily Lenward Chancellor, MD   100 mg at 12/30/17 5726  . traZODone (DESYREL) tablet 100 mg  100 mg Oral QHS PRN He, Jun, MD   100 mg at 12/20/17 2129    Lab Results: No results found for this or any previous visit (from the past 48 hour(s)).  Blood Alcohol level:  Lab Results  Component Value Date   ETH <10 20/35/5974    Metabolic Disorder Labs: No results found for: HGBA1C, MPG No results found for: PROLACTIN No results found for: CHOL, TRIG, HDL, CHOLHDL, VLDL, LDLCALC  Physical Findings: AIMS: Facial and Oral Movements Muscles of Facial Expression: None, normal Lips and Perioral Area: None, normal Jaw: None, normal Tongue: None, normal,Extremity Movements Upper (arms, wrists, hands, fingers): None, normal Lower (legs, knees, ankles, toes): None, normal, Trunk Movements Neck, shoulders, hips: None, normal, Overall Severity Severity of abnormal movements (highest score from questions above): None, normal Incapacitation due to abnormal movements: None, normal Patient's awareness of abnormal movements (rate only patient's report): No Awareness, Dental Status Current problems with teeth and/or dentures?: No Does patient usually wear dentures?: No  CIWA:    COWS:     Musculoskeletal: Strength & Muscle Tone: within normal limits Gait & Station: normal Patient leans: N/A  Psychiatric Specialty Exam: Physical Exam  Nursing note and vitals reviewed.   Review of Systems  All other systems reviewed and are negative.   Blood pressure (!) 125/94, pulse 87, temperature 98 F (36.7  C), temperature source Oral, resp. rate 20, height '5\' 8"'  (1.727 m), weight 80.7 kg, SpO2 100 %.Body mass index is 27.06 kg/m.  General Appearance: Casual  Eye Contact:  Good  Speech:  Clear and Coherent  Volume:  Normal  Mood:  Euthymic  Affect:  Appropriate  Thought Process:  Coherent and Goal Directed  Orientation:  Full (  Time, Place, and Person)  Thought Content:  Logical  Suicidal Thoughts:  No  Homicidal Thoughts:  No  Memory:  Recent;   Poor  Judgement:  Impaired  Insight:  Lacking  Psychomotor Activity:  Normal  Concentration:  Concentration: Poor  Recall:  Poor  Fund of Knowledge:  Fair  Language:  Fair  Akathisia:  No      Assets:  Resilience  ADL's:  Intact  Cognition:  Moderate impairment  Sleep:  Number of Hours: 6.75     Treatment Plan Summary: Pt is still pending placement.   Plan:  Mood -Continue Tegretol 400 mg BID. Mood stable on higher dose -Continue Seroquel 400 mg qhs  Korsakoff's syndrome -Neurology was already consulted while in hospital in [facility] and workup completed including MRI, CT head, RPR. 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 She also has long history of heavy alcohol abuse and poor dietary intake. Her physical symptoms are improved significant since then and only persistent symptoms are short term memory loss ----MOCA on 9/27/19was 21/30 -PT evaluated and felt she was now independent with cares. -OT recommends supervised living due to poor short term memory issues and help with tracking day of the week and day to day management skills. This could be accomplished in an assisted living or groups home living situation  Dispo -Per her treatment team at [facility] (which includes myself who is her psychiatrist, her CSW, and nursing staff, as well as recommendations from physical therapy and occupational therapy), a [place] or Group home is assessed to be most appropriate level of  care at this time. CSW has found a supervised living facility willing to take her. There is some hesitancy with her guardians at [facility] due to her "behaviors."  Discussed with them that [facility] are equipped to deal with people the behavioral issues. Also, her behavioral issues have improved significantly. She does not meet criteria for any other level of care at this time. Our goal is least restrictive level of care. Any locked facility is inappropriate and much too restrictive for her at this time. Furthermore, she is likely to have more behaviors issues and acting out at any locked or restrictive facility. She has not had chance to attempt any lower level of care and has been in locked facility since April 2019 (hospitalized at [facility], went to [facility] memory care unit, and then immediately to [facility]). Prior to this she was living independently.  Marylin Crosby, MD 12/31/2017, 1:35 PM

## 2017-12-31 NOTE — Tx Team (Signed)
Interdisciplinary Treatment and Diagnostic Plan Update  12/31/2017 Time of Session: 8:30am Joanne Johnston MRN: [id number]  Principal Diagnosis: Unspecified mood (affective) disorder (HCC)  Secondary Diagnoses: Principal Problem:   Unspecified mood (affective) disorder (HCC) Active Problems:   Korsakoff syndrome (HCC)   Mood disorder due to a general medical condition   Current Medications:  Current Facility-Administered Medications  Medication Dose Route Frequency Provider Last Rate Last Dose  . acetaminophen (TYLENOL) tablet 650 mg  650 mg Oral Q6H PRN Clapacs, John T, MD      . alum & mag hydroxide-simeth (MAALOX/MYLANTA) 200-200-20 MG/5ML suspension 30 mL  30 mL Oral Q4H PRN Clapacs, John T, MD      . benztropine (COGENTIN) tablet 1 mg  1 mg Oral Q6H PRN Beverly Sessions, MD       Or  . benztropine mesylate (COGENTIN) injection 1 mg  1 mg Intramuscular Q6H PRN Beverly Sessions, MD      . carbamazepine (TEGRETOL) tablet 400 mg  400 mg Oral BID McNew, Holly R, MD   400 mg at 12/30/17 2202  . haloperidol (HALDOL) tablet 5 mg  5 mg Oral Q6H PRN Beverly Sessions, MD   5 mg at 12/29/17 1120   Or  . haloperidol lactate (HALDOL) injection 5 mg  5 mg Intramuscular Q6H PRN Beverly Sessions, MD   5 mg at 12/18/17 1403  . LORazepam (ATIVAN) injection 1 mg  1 mg Intramuscular Q6H PRN He, Jun, MD   1 mg at 11/22/17 0208  . LORazepam (ATIVAN) tablet 1 mg  1 mg Oral Q6H PRN Beverly Sessions, MD   1 mg at 12/29/17 2101   Or  . LORazepam (ATIVAN) injection 1 mg  1 mg Intramuscular Q6H PRN Beverly Sessions, MD   1 mg at 12/18/17 1407  . magnesium hydroxide (MILK OF MAGNESIA) suspension 30 mL  30 mL Oral Daily PRN Clapacs, John T, MD      . QUEtiapine (SEROQUEL) tablet 400 mg  400 mg Oral QHS McNew, Ileene Hutchinson, MD   400 mg at 12/30/17 2202  . thiamine (VITAMIN B-1) tablet 100 mg  100 mg Oral Daily Beverly Sessions, MD   100 mg at 12/30/17 4098  . traZODone (DESYREL) tablet 100 mg  100 mg Oral  QHS PRN He, Jun, MD   100 mg at 12/20/17 2129   PTA Medications: Medications Prior to Admission  Medication Sig Dispense Refill Last Dose  . [EXPIRED] Multiple Vitamin (MULTI-VITAMINS) TABS Take 1 tablet by mouth daily.    unknown at unknown  . QUEtiapine (SEROQUEL) 25 MG tablet Take 25 mg by mouth daily.    unknown at unknown  . QUEtiapine (SEROQUEL) 50 MG tablet Take 50 mg by mouth at bedtime.    unknown at unknown  . topiramate (TOPAMAX) 50 MG tablet Take 50 mg by mouth daily.    unknwon at unknown    Patient Stressors: Other: People, buildings, rooms  Patient Strengths: Capable of independent living Physical Health Supportive family/friends  Treatment Modalities: Medication Management, Group therapy, Case management,  1 to 1 session with clinician, Psychoeducation, Recreational therapy.   Physician Treatment Plan for Primary Diagnosis: Unspecified mood (affective) disorder (HCC) Long Term Goal(s): Improvement in symptoms so as ready for discharge Improvement in symptoms so as ready for discharge   Short Term Goals: Ability to identify changes in lifestyle to reduce recurrence of condition will improve Ability to identify changes in lifestyle to reduce recurrence of condition will improve  Medication Management: Evaluate patient's  response, side effects, and tolerance of medication regimen.  Therapeutic Interventions: 1 to 1 sessions, Unit Group sessions and Medication administration.  Evaluation of Outcomes: Progressing  Physician Treatment Plan for Secondary Diagnosis: Principal Problem:   Unspecified mood (affective) disorder (HCC) Active Problems:   Korsakoff syndrome (HCC)   Mood disorder due to a general medical condition  Long Term Goal(s): Improvement in symptoms so as ready for discharge Improvement in symptoms so as ready for discharge   Short Term Goals: Ability to identify changes in lifestyle to reduce recurrence of condition will improve Ability to  identify changes in lifestyle to reduce recurrence of condition will improve     Medication Management: Evaluate patient's response, side effects, and tolerance of medication regimen.  Therapeutic Interventions: 1 to 1 sessions, Unit Group sessions and Medication administration.  Evaluation of Outcomes: Progressing   RN Treatment Plan for Primary Diagnosis: Unspecified mood (affective) disorder (HCC) Long Term Goal(s): Knowledge of disease and therapeutic regimen to maintain health will improve  Short Term Goals: Ability to identify and develop effective coping behaviors will improve and Compliance with prescribed medications will improve  Medication Management: RN will administer medications as ordered by provider, will assess and evaluate patient's response and provide education to patient for prescribed medication. RN will report any adverse and/or side effects to prescribing provider.  Therapeutic Interventions: 1 on 1 counseling sessions, Psychoeducation, Medication administration, Evaluate responses to treatment, Monitor vital signs and CBGs as ordered, Perform/monitor CIWA, COWS, AIMS and Fall Risk screenings as ordered, Perform wound care treatments as ordered.  Evaluation of Outcomes: Progressing   LCSW Treatment Plan for Primary Diagnosis: Unspecified mood (affective) disorder (HCC) Long Term Goal(s): Safe transition to appropriate next level of care at discharge, Engage patient in therapeutic group addressing interpersonal concerns.  Short Term Goals: Engage patient in aftercare planning with referrals and resources, Identify triggers associated with mental health/substance abuse issues and Increase skills for wellness and recovery  Therapeutic Interventions: Assess for all discharge needs, 1 to 1 time with Social worker, Explore available resources and support systems, Assess for adequacy in community support network, Educate family and significant other(s) on suicide  prevention, Complete Psychosocial Assessment, Interpersonal group therapy.  Evaluation of Outcomes: Progressing   Progress in Treatment: Attending groups: No. Participating in groups: No. Taking medication as prescribed: Yes. Toleration medication: Yes. Family/Significant other contact made: Yes, individual(s) contacted:  CSW has made contact with pt's guardian, Esmeralda Arthur with [employer]. Patient understands diagnosis: No. Discussing patient identified problems/goals with staff: Yes. Medical problems stabilized or resolved: Yes. Denies suicidal/homicidal ideation: Yes. Issues/concerns per patient self-inventory: No. Other:    New problem(s) identified: Yes, Describe:  Patient has been having a few behavioral issues on the unit.  New Short Term/Long Term Goal(s):  Patient Goals:  To be discharged  Discharge Plan or Barriers: TBD.  CSW has located a [facility] for the patient. Patients guardian and team with [facility] has agreed to the plan. CSW is waiting on confirmation from the [facility] department on clearing the patients money to the [facility].  Reason for Continuation of Hospitalization: Other; describe Need to identify and secure appropriate residential placement for discharge.  Estimated Length of Stay: 1-3 days  Recreational Therapy: Patient Stressors: N/A Patient Goal: Patient will engage in interactions with peers and staff in pro-social manner at least 2x within 5 recreation therapy group sessions  Attendees: Patient: 12/31/2017 9:17 AM  Physician: Corinna Gab, MD 12/31/2017 9:17 AM  Nursing: Curt Bears  Chisem, RN 12/31/2017 9:17 AM  RN Care Manager: 12/31/2017 9:17 AM  Social Worker: Joneen Roach, LCSWA 12/31/2017 9:17 AM  Recreational Therapist:  12/31/2017 9:17 AM  Other: Lowella Dandy, LCSW 12/31/2017 9:17 AM  Other:Harvey Beverely Pace, RHA 12/31/2017 9:17 AM  Other: Corinna Gab, MD 12/31/2017 9:17 AM    Scribe for Treatment Team: Johny Shears,  LCSW 12/31/2017 9:17 AM

## 2017-12-31 NOTE — Progress Notes (Signed)
Recreation Therapy Notes   Date: 12/31/2017  Time: 9:30 am   Location: Craft room   Behavioral response: N/A   Intervention Topic: Self-esteem  Discussion/Intervention: Patient did not attend group.   Clinical Observations/Feedback:  Patient did not attend group.   Kingdom Vanzanten LRT/CTRS        Shery Wauneka 12/31/2017 11:37 AM

## 2017-12-31 NOTE — Plan of Care (Signed)
  Problem: Coping: Goal: Ability to identify and develop effective coping behavior will improve Outcome: Progressing  Patient appears anxious interacting appropriately with peers and staff.

## 2018-01-01 NOTE — Progress Notes (Signed)
Recreation Therapy Notes   Date: 01/01/2018  Time: 9:30 am   Location: Craft room   Behavioral response: N/A   Intervention Topic: Problem Solving  Discussion/Intervention: Patient did not attend group.   Clinical Observations/Feedback:  Patient did not attend group.   Joas Motton LRT/CTRS         Roan Sawchuk 01/01/2018 10:37 AM

## 2018-01-01 NOTE — Progress Notes (Signed)
[facility] MD Progress Note  01/01/2018 3:03 PM Joanne Johnston  MRN:  [id number] Subjective:  Pt calm and in a good mood today. She does not remember details of our conversation yesterday but states, "I think you are working on a more independent place that's not locked." We discussed the plan again and she is happy to hear this. She is taking medications with no issues. She is in the day room most of the day interacting well with others. Sleeping well.   Principal Problem: Unspecified mood (affective) disorder ([place]) Diagnosis:   Patient Active Problem List   Diagnosis Date Noted  . Unspecified mood (affective) disorder ([place]) [F39] 11/18/2017    Priority: High  . 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 Agitation [R45.1] 11/04/2017   Total Time spent with patient: 15 minutes  Past Psychiatric History: See H&p  Past Medical History:  Past Medical History:  Diagnosis Date  . Alcohol dependence ([place])   . Anxiety   . Dementia   . Depression   . Hypertension   . Tachycardia     Past Surgical History:  Procedure Laterality Date  . CESAREAN SECTION     x2   Family History: History reviewed. No pertinent family history. Family Psychiatric  History: See H&P Social History:  Social History   Substance and Sexual Activity  Alcohol Use Not Currently  . Frequency: Never   Comment: states hasn't had a drink in months     Social History   Substance and Sexual Activity  Drug Use Never    Social History   Socioeconomic History  . Marital status: Divorced    Spouse name: Not on file  . Number of children: Not on file  . Years of education: Not on file  . Highest education level: Not on file  Occupational History  . Not on file  Social Needs  . Financial resource strain: Not on file  . Food insecurity:    Worry: Not on file    Inability: Not on file  . Transportation needs:    Medical: Not on file   Non-medical: Not on file  Tobacco Use  . Smoking status: Never Smoker  . Smokeless tobacco: Never Used  Substance and Sexual Activity  . Alcohol use: Not Currently    Frequency: Never    Comment: states hasn't had a drink in months  . Drug use: Never  . Sexual activity: Not Currently  Lifestyle  . Physical activity:    Days per week: Not on file    Minutes per session: Not on file  . Stress: Not on file  Relationships  . Social connections:    Talks on phone: Not on file    Gets together: Not on file    Attends religious service: Not on file    Active member of club or organization: Not on file    Attends meetings of clubs or organizations: Not on file    Relationship status: Not on file  Other Topics Concern  . Not on file  Social History Narrative  . Not on file   Additional Social History:                         Sleep: Good  Appetite:  Good  Current Medications: Current Facility-Administered Medications  Medication Dose Route Frequency Provider Last Rate Last Dose  . acetaminophen (TYLENOL) tablet 650 mg  650  mg Oral Q6H PRN Clapacs, Madie Reno, MD      . alum & mag hydroxide-simeth (MAALOX/MYLANTA) 200-200-20 MG/5ML suspension 30 mL  30 mL Oral Q4H PRN Clapacs, John T, MD      . benztropine (COGENTIN) tablet 1 mg  1 mg Oral Q6H PRN Lenward Chancellor, MD       Or  . benztropine mesylate (COGENTIN) injection 1 mg  1 mg Intramuscular Q6H PRN Lenward Chancellor, MD      . carbamazepine (TEGRETOL) tablet 400 mg  400 mg Oral BID Vella Colquitt R, MD   400 mg at 01/01/18 1403  . haloperidol (HALDOL) tablet 5 mg  5 mg Oral Q6H PRN Lenward Chancellor, MD   5 mg at 12/29/17 1120   Or  . haloperidol lactate (HALDOL) injection 5 mg  5 mg Intramuscular Q6H PRN Lenward Chancellor, MD   5 mg at 12/18/17 1403  . LORazepam (ATIVAN) injection 1 mg  1 mg Intramuscular Q6H PRN He, Jun, MD   1 mg at 11/22/17 0208  . LORazepam (ATIVAN) tablet 1 mg  1 mg Oral Q6H PRN Lenward Chancellor,  MD   1 mg at 12/29/17 2101   Or  . LORazepam (ATIVAN) injection 1 mg  1 mg Intramuscular Q6H PRN Lenward Chancellor, MD   1 mg at 12/18/17 1407  . magnesium hydroxide (MILK OF MAGNESIA) suspension 30 mL  30 mL Oral Daily PRN Clapacs, John T, MD      . QUEtiapine (SEROQUEL) tablet 400 mg  400 mg Oral QHS Duard Spiewak, Tyson Babinski, MD   400 mg at 12/31/17 2216  . thiamine (VITAMIN B-1) tablet 100 mg  100 mg Oral Daily Lenward Chancellor, MD   100 mg at 01/01/18 1403  . traZODone (DESYREL) tablet 100 mg  100 mg Oral QHS PRN He, Jun, MD   100 mg at 12/20/17 2129    Lab Results: No results found for this or any previous visit (from the past 48 hour(s)).  Blood Alcohol level:  Lab Results  Component Value Date   ETH <10 98/33/8250    Metabolic Disorder Labs: No results found for: HGBA1C, MPG No results found for: PROLACTIN No results found for: CHOL, TRIG, HDL, CHOLHDL, VLDL, LDLCALC  Physical Findings: AIMS: Facial and Oral Movements Muscles of Facial Expression: None, normal Lips and Perioral Area: None, normal Jaw: None, normal Tongue: None, normal,Extremity Movements Upper (arms, wrists, hands, fingers): None, normal Lower (legs, knees, ankles, toes): None, normal, Trunk Movements Neck, shoulders, hips: None, normal, Overall Severity Severity of abnormal movements (highest score from questions above): None, normal Incapacitation due to abnormal movements: None, normal Patient's awareness of abnormal movements (rate only patient's report): No Awareness, Dental Status Current problems with teeth and/or dentures?: No Does patient usually wear dentures?: No  CIWA:    COWS:     Musculoskeletal: Strength & Muscle Tone: within normal limits Gait & Station: normal Patient leans: N/A  Psychiatric Specialty Exam: Physical Exam  Nursing note and vitals reviewed.   Review of Systems  All other systems reviewed and are negative.   Blood pressure 110/79, pulse 83, temperature 98.6 F (37 C),  temperature source Oral, resp. rate 18, height '5\' 8"'$  (1.727 m), weight 80.7 kg, SpO2 100 %.Body mass index is 27.06 kg/m.  General Appearance: Casual  Eye Contact:  Good  Speech:  Clear and Coherent  Volume:  Normal  Mood:  Euthymic  Affect:  Appropriate  Thought Process:  Coherent and Goal Directed  Orientation:  Full (Time, Place, and Person)  Thought Content:  Logical  Suicidal Thoughts:  No  Homicidal Thoughts:  No  Memory:  Immediate;   Poor  Judgement:  Impaired  Insight:  Lacking  Psychomotor Activity:  Normal  Concentration:  Concentration: Poor  Recall:  Poor  Fund of Knowledge:  Poor  Language:  Fair  Akathisia:  No      Assets:  Resilience  ADL's:  Intact  Cognition:  Impaired,  Moderate  Sleep:  Number of Hours: 7     Treatment Plan Summary: Pt is still pending placement. She has a bed at a [facility]. CSW attempting to sort out financial issues with DSS.   Plan:   -Continue Tegretol 400 mg BID. Mood stable on higher dose -Continue Seroquel 400 mg qhs  Korsakoff's syndrome -Neurology was already consulted while in hospital in [facility] and workup completed including MRI, CT head, RPR. 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 She also has long history of heavy alcohol abuse and poor dietary intake. Her physical symptoms are improved significant since then and only persistent symptoms are short term memory loss ----MOCA on 9/27/19was 21/30 -PT evaluated and felt she was now independent with cares. -OT recommends supervised living due to poor short term memory issues and help with tracking day of the week and day to day management skills. This could be accomplished in an assisted living or groups home living situation  Dispo -Per her treatment team at [facility](which includes myself who is her psychiatrist, her CSW, and nursing staff, as well as recommendations from physical therapy and occupational  therapy), a [facility] or Group home is assessed to be most appropriate level of care at this time. CSW has found a supervised livingfacilitywilling to take her. There is some hesitancy with her guardians at [facility] due to her "behaviors." D[facility]edwith them that [facility] are equipped to deal with people the behavioralissues. Also, her behavioral issues have improved significantly. She does not meet criteria for any other level of care at this time. Our goal is leastrestrictivelevel of care. Any locked facilityis inappropriateand much too restrictivefor her at this time. Furthermore, she is likely to have more behaviorsissues and acting out at any locked or restrictive facility.She has not had chance to attempt any lower level of care and has been in locked facility since April 2019 (hospitalized at [facility], went to [facility] memory care unit, and then immediately to [facility]). Prior to this she was living independently. Marylin Crosby, MD 01/01/2018, 3:03 PM

## 2018-01-01 NOTE — Plan of Care (Signed)
Patient has been observed in the milieu throughout the day interacting well with staff and other members on the unit without any issues. Patient has been participating in the decision-making process regarding her health-care. Patient uses fair eye contact when communicating with this writer. Patient remains safe on the unit at this time.  Problem: Coping: Goal: Ability to identify and develop effective coping behavior will improve Outcome: Progressing Goal: Ability to interact with others will improve Outcome: Progressing Goal: Demonstration of participation in decision-making regarding own care will improve Outcome: Progressing Goal: Ability to use eye contact when communicating with others will improve Outcome: Progressing   Problem: Health Behavior/Discharge Planning: Goal: Identification of resources available to assist in meeting health care needs will improve Outcome: Progressing   

## 2018-01-01 NOTE — [facility] Group Notes (Signed)
LCSW Group Therapy Note  01/01/2018 1:00 pm  Type of Therapy/Topic:  Group Therapy:  Balance in Life  Participation Level:  None  Description of Group:    This group will address the concept of balance and how it feels and looks when one is unbalanced. Patients will be encouraged to process areas in their lives that are out of balance and identify reasons for remaining unbalanced. Facilitators will guide patients in utilizing problem-solving interventions to address and correct the stressor making their life unbalanced. Understanding and applying boundaries will be explored and addressed for obtaining and maintaining a balanced life. Patients will be encouraged to explore ways to assertively make their unbalanced needs known to significant others in their lives, using other group members and facilitator for support and feedback.  Therapeutic Goals: 1. Patient will identify two or more emotions or situations they have that consume much of in their lives. 2. Patient will identify signs/triggers that life has become out of balance:  3. Patient will identify two ways to set boundaries in order to achieve balance in their lives:  4. Patient will demonstrate ability to communicate their needs through discussion and/or role plays  Summary of Patient Progress:  Joanne Johnston presented to group late and did not choose to participate in the group discussion on balance in life.    Therapeutic Modalities:   Cognitive Behavioral Therapy Solution-Focused Therapy Assertiveness Training  Alease Frame, [place] 01/01/2018 4:10 PM

## 2018-01-01 NOTE — Progress Notes (Signed)
D- Patient alert and oriented. Patient presents in a pleasant mood on assessment without voicing any complaints to this Clinical research associate. Patient denies SI, HI, AVH, and pain at this time. Patient also denies any signs/symptoms of depression/anxiety at this time. Patient had no stated goals for today.  A- Scheduled medications administered to patient, per MD orders. Support and encouragement provided.  Routine safety checks conducted every 15 minutes.  Patient informed to notify staff with problems or concerns.  R- No adverse drug reactions noted. Patient contracts for safety at this time. Patient compliant with medications and treatment plan. Patient receptive, calm, and cooperative. Patient interacts well with others on the unit.  Patient remains safe at this time.

## 2018-01-02 MED ORDER — THIAMINE HCL 100 MG PO TABS: 100.0000 mg | ORAL_TABLET | Freq: Every day | ORAL | 1 refills | Status: AC

## 2018-01-02 MED ORDER — MULTI-VITAMINS PO TABS: 1.0000 | ORAL_TABLET | Freq: Every day | ORAL | 0 refills | Status: AC

## 2018-01-02 MED ORDER — TUBERCULIN PPD 5 UNIT/0.1ML ID SOLN
5.0000 [IU] | Freq: Once | INTRADERMAL | Status: DC
  Administered 2018-01-02: 5 [IU] via INTRADERMAL
  Filled 2018-01-02: qty 0.1

## 2018-01-02 NOTE — Progress Notes (Signed)
Patient denies SI/HI, denies A/V hallucinations. Patient verbalizes understanding of discharge instructions, follow up care and prescriptions.7 days medicines given to patient Patient given all belongings from [facility] locker. Patient escorted out by staff, transported by the staff.

## 2018-01-02 NOTE — Progress Notes (Signed)
D - Patient denies SI/HI/AVH, pain, anxiety and depression. Patient sated that she was frustrated today because she is ready to go to a facility to help work on her memory and she is ready to discharge from here. Patient in the day room most of the evening interacting appropriately with peers and staff. Patient had no outbursts this evening.   A - Medications administered to patient per MD orders. Education given. Support and encouragement provided. Routine safety checks conducted Q 15 minutes per unit protocol. Patient informed to notify staff with any problems.   R - Patient was compliant with medications and treatment plan. Patient receptive, calm and cooperative on the unit, during medication administration and during assessment. Patient remains safe on the unit at this time.

## 2018-01-02 NOTE — Discharge Instructions (Signed)
Joanne Johnston does struggle with short term memory loss and often does not remember previous conversations from day before. She requires some re-orinetation each day and gentle encouragement to take her medications. She has had verbal outbursts at times in the past (which have greatly improved in frequency on medications) but is able to calm herself down after a short period of time of yelling. 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  Do not be alarmed if this does occur as she typically calms down and does not remember the episode after it occurs.

## 2018-01-02 NOTE — Progress Notes (Signed)
Recreation Therapy Notes  INPATIENT RECREATION TR PLAN  Patient Details Name: Joanne Johnston MRN: [id number] DOB: 1970/05/07 Today's Date: 01/02/2018  Rec Therapy Plan Is patient appropriate for Therapeutic Recreation?: Yes Treatment times per week: at least 3 Estimated Length of Stay: 5-7 days TR Treatment/Interventions: Group participation (Comment)  Discharge Criteria Pt will be discharged from therapy if:: Discharged Treatment plan/goals/alternatives discussed and agreed upon by:: Patient/family  Discharge Summary Short term goals set: Patient will engage in interactions with peers and staff in pro-social manner at least 2x within 5 recreation therapy group sessions Short term goals met: Not met Progress toward goals comments: Groups attended Which groups?: Leisure education, Other (Comment)(Emotions, Team work , relaxation) Reason goals not met: Patient spent most of her time in her room Therapeutic equipment acquired: N/A Reason patient discharged from therapy: Discharge from hospital Pt/family agrees with progress & goals achieved: Yes Date patient discharged from therapy: 01/02/18   Tona Qualley 01/02/2018, 2:16 PM

## 2018-01-02 NOTE — Discharge Summary (Signed)
Physician Discharge Summary Note  Patient:  Joanne Johnston is an 47 y.o., female MRN:  [id number] DOB:  December 20, 1970 Patient phone:  [phone] (home)  Patient address:   [address] [place] [address],  Total Time spent with patient: 20 minutes  Plus 20 minutes of medication reconciliation, discharge planning, and discahrge documentation   Date of Admission:  11/14/2017 Date of Discharge: 01/02/18  Reason for Admission:  Behavioral outbursts on memory care unit  Principal Problem: Unspecified mood (affective) disorder [facility]) Discharge Diagnoses: Patient Active Problem List   Diagnosis Date Noted  . Unspecified mood (affective) disorder (HCC) [F39] 11/18/2017    Priority: High  . 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 Agitation [R45.1] 11/04/2017    Past Psychiatric History: Long history of alcohol abuse which has resulted in short term memory issues related to Korsakoff syndrome. Her family estimates she would drink at least a bottle or two of wine a day and not eat for several days.   :I did [place]. It appears she was very confused at the initial part ofthathospitalization. Physical therapydid evaluation at that time and recommend SNF at that timedue to very unsteady gait and inability to care for herself at that time. Unclear why it progressed into needed locked memory care unit. It also appears the last psychiatric evaluation was in June 2019 and they agreed with diagnosis of Korsakoff syndrome. I did not see any sort of placement recommendation from them other than due to cognitive issues they felt she was at high risk to live independently.Her physical symptoms and even cognition has improved significantly since then.  Timeline of events:  Hospitalized from April 2019 until September-brought in by bf  intoxicated, confused, and very unstable on gait-neurology andpsychiatryboth consultedand diagnosed her with [place] Qua Korsakoff with behaviors issues (medical/neurological diagnosis). CT and MRI head completed which were both normal. RPR negative, Full workup done.   Long history of alcohol abuse-per family, she used to drink "constantly" and had very poor diet. She has not had anything to drink for several months. She was started onSeroquelduring last hospitalizationfor mood stability.  On 11/21/17,We discussed her memory deficits. She states that she has trouble with short term things. She states, "my long term memory is great." She sat down with me and we did MOCA together. She scored 21/30 but this score could have been higher as she was not familiar with this hospital as she was brought here by ambulance. The main deficit was in delayed recall. She did really well on the other sections including visuospatial and executive functioning.  Reviewed e-mail correspondence with her case manger with [facility]. It appears there has been some consultation done with a physical through them. They recommended a neurology consult for workup of otheretiologiessuch as neurosyphilis, NMDA. Neurology was consulted during her previous extended hospitalization. Given her long history of alcohol use (2 bottles of wine or 1/5 of vodka a day) and very poor oral intake as well as presentation of unsteady gait, amnesia and ataxia, she was diagnosed with Wernicke-Korsakoff. MRI brain and CT done were done at that time and unremarkable. RPR done as well which was negative so this rules out neurosyphilis. She has no symptoms at all suggestive of anti-NMDA that warrant work up for this (including afebrile, vitals have been stable, no psychosis at all). It was also noted that there was agreement that a  secure nursing facility is not the ideal setting for her and would be more successful in group home/ALF setting with  community supports as restrictive setting contributed to some of the behavioral issues.  She [facility] and is not appropriate for state psychiatric hospital.  Past Medical History:  Past Medical History:  Diagnosis Date  . Alcohol dependence (HCC)   . Anxiety   . Dementia   . Depression   . Hypertension   . Tachycardia     Past Surgical History:  Procedure Laterality Date  . CESAREAN SECTION     x2   Family History: History reviewed. No pertinent family history. Family Psychiatric  History: See H&P Social History:  Social History   Substance and Sexual Activity  Alcohol Use Not Currently  . Frequency: Never   Comment: states hasn't had a drink in months     Social History   Substance and Sexual Activity  Drug Use Never    Social History   Socioeconomic History  . Marital status: Divorced    Spouse name: Not on file  . Number of children: Not on file  . Years of education: Not on file  . Highest education level: Not on file  Occupational History  . Not on file  Social Needs  . Financial resource strain: Not on file  . Food insecurity:    Worry: Not on file    Inability: Not on file  . Transportation needs:    Medical: Not on file    Non-medical: Not on file  Tobacco Use  . Smoking status: Never Smoker  . Smokeless tobacco: Never Used  Substance and Sexual Activity  . Alcohol use: Not Currently    Frequency: Never    Comment: states hasn't had a drink in months  . Drug use: Never  . Sexual activity: Not Currently  Lifestyle  . Physical activity:    Days per week: Not on file    Minutes per session: Not on file  . Stress: Not on file  Relationships  . Social connections:    Talks on phone: Not on file    Gets together: Not on file    Attends religious service: Not on file    Active member of club or organization: Not on file    Attends meetings of clubs or organizations: Not on file    Relationship  status: Not on file  Other Topics Concern  . Not on file  Social History Narrative  . Not on file    Hospital Course:  Pt was started on Tegretol and titrated up to 400 mg BID and Serouqel was adjusted to nighttime doseing of 400 mg qhs. She may need higher dosees of both medications as Tegretol is a potent inducer. Pt was overall calm on the unit. She did have periodic verbal outbursts related to being in the hospital and not being able to have her things. Short term memory issues are evident as she often would not remember the outbursts or conversations. She did require some reorienation each day. I strongly feel that the behaviorl isues are soley related to frontal lobe damage related to Korsakoffs syndrome and long histosr Of alcohl use. I do not feel she has a mental health diagnosis as she has not had any manic or psychotic episodes in the past or recently. She is likely to have some continued behabioral outbursts at her placement butshe is able to calm herself down without any interventions.  Behavioral management will be key with her. She likely does not meet criteria for inpatinet psychiatirc placement in the future if she were to return to the ED for behavioral distubances as this is primarily a neurological presentation. Her hospitalization was greatly extended due to issues with placmenet as she did not meet criteria for many facilites. Her guardians were hesitant to seek out any placement other than a lcoked facilty which was felt by her treatment team to be much too restrtictive level of care for her leading to worseing behaviors and acting out. Phyiscal therapy and occupatino therapy were both consulted. PT felt she was much improved as far as a physical standpoint and did not require any assistance. OT felt that some sort of supervised living facility was appropriate due to her short term memory issues. On day of discharge, she was pleasant and calm. No outbursts noted. We discussed her living  placment and was excited to finally get out of the hospital. I did speak with her mother and let her know the address of whre she will be going. We also discussed that the long term goal should be to move pt to [place] to be closer to family as this will really help with her beahviors.   Physical Findings: AIMS: Facial and Oral Movements Muscles of Facial Expression: None, normal Lips and Perioral Area: None, normal Jaw: None, normal Tongue: None, normal,Extremity Movements Upper (arms, wrists, hands, fingers): None, normal Lower (legs, knees, ankles, toes): None, normal, Trunk Movements Neck, shoulders, hips: None, normal, Overall Severity Severity of abnormal movements (highest score from questions above): None, normal Incapacitation due to abnormal movements: None, normal Patient's awareness of abnormal movements (rate only patient's report): No Awareness, Dental Status Current problems with teeth and/or dentures?: No Does patient usually wear dentures?: No  CIWA:    COWS:     Musculoskeletal: Strength & Muscle Tone: within normal limits Gait & Station: normal Patient leans: N/A  Psychiatric Specialty Exam: Physical Exam  Nursing note and vitals reviewed.   Review of Systems  All other systems reviewed and are negative.   Blood pressure 110/79, pulse 83, temperature 98.6 F (37 C), temperature source Oral, resp. rate 18, height 5\' 8"  (1.727 m), weight 80.7 kg, SpO2 100 %.Body mass index is 27.06 kg/m.  General Appearance: Casual  Eye Contact:  Fair  Speech:  Clear and Coherent  Volume:  Normal  Mood:  Irritable  Affect:  Appropriate  Thought Process:  Coherent and Goal Directed  Orientation:  Full (Time, Place, and Person)  Thought Content:  Logical  Suicidal Thoughts:  No  Homicidal Thoughts:  No  Memory:  Recent;   Poor  Judgement:  Impaired  Insight:  Lacking  Psychomotor Activity:  Normal  Concentration:  Concentration: Poor  Recall:  Poor  Fund of Knowledge:   Fair  Language:  Fair  Akathisia:  No      Assets:  Resilience  ADL's:  Intact  Cognition:  Impaired,  Moderate  Sleep:  Number of Hours: 7.25     Have you used any form of tobacco in the last 30 days? (Cigarettes, Smokeless Tobacco, Cigars, and/or Pipes): No  Has this patient used any form of tobacco in the last 30 days? (Cigarettes, Smokeless Tobacco, Cigars, and/or Pipes) No  Blood Alcohol level:  Lab Results  Component Value Date   ETH <10 11/04/2017    Metabolic Disorder Labs:  No results found for: HGBA1C, MPG No results found for: PROLACTIN No results found  for: CHOL, TRIG, HDL, CHOLHDL, VLDL, LDLCALC  See Psychiatric Specialty Exam and Suicide Risk Assessment completed by Attending Physician prior to discharge.  Discharge destination:  Other:  Family Care home [place]  Is patient on multiple antipsychotic therapies at discharge:  No   Has Patient had three or more failed trials of antipsychotic monotherapy by history:  No  Recommended Plan for Multiple Antipsychotic Therapies: NA  Discharge Instructions    Increase activity slowly   Complete by:  As directed      Allergies as of 01/02/2018   No Known Allergies     Medication List    STOP taking these medications   topiramate 50 MG tablet Commonly known as:  TOPAMAX     TAKE these medications     Indication  carbamazepine 200 MG tablet Commonly known as:  TEGRETOL Take 2 tablets (400 mg total) by mouth 2 (two) times daily.  Indication:  Mood stabilization   MULTI-VITAMINS Tabs Take 1 tablet by mouth daily.  Indication:  Vitamin   QUEtiapine 400 MG tablet Commonly known as:  SEROQUEL Take 1 tablet (400 mg total) by mouth at bedtime. What changed:    medication strength  how much to take  Another medication with the same name was removed. Continue taking this medication, and follow the directions you see here.  Indication:  Mood stabilization   thiamine 100 MG tablet Take 1 tablet  (100 mg total) by mouth daily.  Indication:  Korsakoffs      Follow-up Information    [facility] Go on 01/05/2018.   Why:  Please follow up on Monday January 05, 2018 at 8:30am for your hospital follow up appointment. Thank you. Contact information: [address] [place] [address] [phone]          Signed: Haskell Riling, MD 01/02/2018, 10:25 AM

## 2018-01-02 NOTE — BHH Suicide Risk Assessment (Signed)
[facility] Discharge Suicide Risk Assessment   Principal Problem: Unspecified mood (affective) disorder [facility]) Discharge Diagnoses:  Patient Active Problem List   Diagnosis Date Noted  . Unspecified mood (affective) disorder (HCC) [F39] 11/18/2017    Priority: High  . 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 Agitation [R45.1] 11/04/2017   Mental Status Per Nursing Assessment::   On Admission:  NA  Demographic Factors:  Caucasian and Unemployed  Loss Factors: Decline in physical health  Historical Factors: Impulsivity  Risk Reduction Factors:   Living with another person, especially a relative  Continued Clinical Symptoms:  Memory issues   Cognitive Features That Contribute To Risk:  Loss of executive function    Suicide Risk:  Minimal: No identifiable suicidal ideation.  Patients presenting with no risk factors but with morbid ruminations; may be classified as minimal risk based on the severity of the depressive symptoms  Follow-up Information    [facility] Go on 01/05/2018.   Why:  Please follow up on Monday January 05, 2018 at 8:30am for your hospital follow up appointment. Thank you. Contact information: [address] [place] [address] [phone]            Haskell Riling, MD 01/02/2018, 10:22 AM

## 2018-01-02 NOTE — NC FL2 (Addendum)
[place] MEDICAID FL2 LEVEL OF CARE SCREENING TOOL     IDENTIFICATION  Patient Name: Joanne Johnston Birthdate: 1970-09-27 Sex: female Admission Date (Current Location): 11/14/2017  [place] and [place] Number:  Zane Herald [place]) [id number]  Facility and Address:  [facility], [address], [place], [place] [address]      Provider Number: [id number]  Attending Physician Name and Address:  Haskell Riling, MD  Relative Name and Phone Number:  Guardian Monico Hoar, Armanda Magic [phone] ,  [facility] [facility]     Current Level of Care: Hospital Recommended Level of Care: Other (Comment)(Family Care Home) Prior Approval Number:    Date Approved/Denied:   PASRR Number:    Discharge Plan: Other (Comment)(Family Care Home)    Current Diagnoses: Patient Active Problem List   Diagnosis Date Noted  . Mood disorder due to a general medical condition 12/16/2017  . Korsakoff syndrome (HCC) 12/04/2017  . Unspecified mood (affective) disorder (HCC) 11/18/2017  . Agitation 11/04/2017    Orientation RESPIRATION BLADDER Height & Weight     Self, Time, Place  Normal Continent Weight: 178 lb (80.7 kg) Height:  5\' 8"  (172.7 cm)  BEHAVIORAL SYMPTOMS/MOOD NEUROLOGICAL BOWEL NUTRITION STATUS  Verbally abusive   Continent    AMBULATORY STATUS COMMUNICATION OF NEEDS Skin   Independent Verbally Normal                       Personal Care Assistance Level of Assistance  Bathing, Feeding, Dressing Bathing Assistance: Independent Feeding assistance: Independent Dressing Assistance: Independent     Functional Limitations Info  Sight, Hearing, Speech Sight Info: Adequate Hearing Info: Adequate Speech Info: Adequate    SPECIAL CARE FACTORS FREQUENCY                       Contractures Contractures Info: Not present    Additional Factors Info                  Current Medications (01/02/2018):  This is the current hospital active medication  list Current Facility-Administered Medications  Medication Dose Route Frequency Provider Last Rate Last Dose  . acetaminophen (TYLENOL) tablet 650 mg  650 mg Oral Q6H PRN Clapacs, John T, MD      . alum & mag hydroxide-simeth (MAALOX/MYLANTA) 200-200-20 MG/5ML suspension 30 mL  30 mL Oral Q4H PRN Clapacs, John T, MD      . benztropine (COGENTIN) tablet 1 mg  1 mg Oral Q6H PRN Beverly Sessions, MD       Or  . benztropine mesylate (COGENTIN) injection 1 mg  1 mg Intramuscular Q6H PRN Beverly Sessions, MD      . carbamazepine (TEGRETOL) tablet 400 mg  400 mg Oral BID McNew, Holly R, MD   400 mg at 01/01/18 2120  . haloperidol (HALDOL) tablet 5 mg  5 mg Oral Q6H PRN Beverly Sessions, MD   5 mg at 12/29/17 1120   Or  . haloperidol lactate (HALDOL) injection 5 mg  5 mg Intramuscular Q6H PRN Beverly Sessions, MD   5 mg at 12/18/17 1403  . LORazepam (ATIVAN) injection 1 mg  1 mg Intramuscular Q6H PRN He, Jun, MD   1 mg at 11/22/17 0208  . LORazepam (ATIVAN) tablet 1 mg  1 mg Oral Q6H PRN Beverly Sessions, MD   1 mg at 12/29/17 2101   Or  . LORazepam (ATIVAN) injection 1 mg  1 mg Intramuscular Q6H PRN Beverly Sessions, MD  1 mg at 12/18/17 1407  . magnesium hydroxide (MILK OF MAGNESIA) suspension 30 mL  30 mL Oral Daily PRN Clapacs, John T, MD      . QUEtiapine (SEROQUEL) tablet 400 mg  400 mg Oral QHS McNew, Ileene Hutchinson, MD   400 mg at 01/01/18 2120  . thiamine (VITAMIN B-1) tablet 100 mg  100 mg Oral Daily Beverly Sessions, MD   100 mg at 01/01/18 1403  . traZODone (DESYREL) tablet 100 mg  100 mg Oral QHS PRN He, Jun, MD   100 mg at 12/20/17 2129     Discharge Medications: Please see discharge summary for a list of discharge medications.  Relevant Imaging Results:  Relevant Lab Results:   Additional Information    Johny Shears, LCSW

## 2018-01-02 NOTE — BHH Counselor (Signed)
CSW spoke with [facility] DSS regarding the patients discharge plan. They mailed a check to the patients [facility] at [facility] in [place]. Once the check is received, the [facility] will come and pick up the patient for discharge.  Johny Shears, MSW, Theresia Majors, Bridget Hartshorn Clinical Social Worker 01/02/2018 10:18 AM

## 2018-01-02 NOTE — Progress Notes (Signed)
  [facility] Adult Case Management Discharge Plan :  Will you be returning to the same living situation after discharge:  No. At discharge, do you have transportation home?: Yes,  [facility] will come at discharge Do you have the ability to pay for your medications: Yes,  Referred to a provider who can assist  Release of information consent forms completed and in the chart;  Patient's signature needed at discharge.  Patient to Follow up at: Follow-up Information    [facility] Go on 01/05/2018.   Why:  Please follow up on Monday January 05, 2018 at 8:30am for your hospital follow up appointment. Thank you. Contact information: [address] [place] [address] [phone]           Next level of care provider has access to [facility] Link:no  Safety Planning and Suicide Prevention discussed: Yes,  Completed with patient and Doylene Canard Farrior with [place] [facility]  Have you used any form of tobacco in the last 30 days? (Cigarettes, Smokeless Tobacco, Cigars, and/or Pipes): No  Has patient been referred to the Quitline?: N/A patient is not a smoker  Patient has been referred for addiction treatment: N/A  Johny Shears, LCSW 01/02/2018, 10:19 AM

## 2018-01-02 NOTE — Plan of Care (Signed)
Patient was seen in the day room interacting appropriately with staff and peers this evening. Patient used fair eye contact with this Clinical research associate during assessment and medication administration.   Problem: Coping: Goal: Ability to interact with others will improve Outcome: Progressing Goal: Ability to use eye contact when communicating with others will improve Outcome: Progressing

## 2018-05-27 ENCOUNTER — Other Ambulatory Visit: Payer: Self-pay | Admitting: Neurology

## 2018-05-27 DIAGNOSIS — R4189 Other symptoms and signs involving cognitive functions and awareness: Secondary | ICD-10-CM

## 2018-06-10 ENCOUNTER — Other Ambulatory Visit: Payer: Self-pay | Admitting: Internal Medicine

## 2018-06-10 DIAGNOSIS — R928 Other abnormal and inconclusive findings on diagnostic imaging of breast: Secondary | ICD-10-CM

## 2018-06-15 ENCOUNTER — Ambulatory Visit
Admission: RE | Admit: 2018-06-15 | Discharge: 2018-06-15 | Disposition: A | Discharge status: Home / Self Care | Payer: Medicaid Other | Source: Ambulatory Visit | Attending: Internal Medicine | Admitting: Internal Medicine

## 2018-06-15 ENCOUNTER — Other Ambulatory Visit: Payer: Self-pay

## 2018-06-15 DIAGNOSIS — R928 Other abnormal and inconclusive findings on diagnostic imaging of breast: Secondary | ICD-10-CM | POA: Insufficient documentation

## 2018-06-29 ENCOUNTER — Emergency Department
Admission: EM | Admit: 2018-06-29 | Discharge: 2018-06-29 | Disposition: A | Discharge status: Home / Self Care | Payer: Medicaid Other | Attending: Emergency Medicine | Admitting: Emergency Medicine

## 2018-06-29 ENCOUNTER — Other Ambulatory Visit: Payer: Self-pay

## 2018-06-29 ENCOUNTER — Encounter: Payer: Self-pay | Admitting: Emergency Medicine

## 2018-06-29 DIAGNOSIS — Z008 Encounter for other general examination: Secondary | ICD-10-CM | POA: Diagnosis not present

## 2018-06-29 DIAGNOSIS — F0390 Unspecified dementia without behavioral disturbance: Secondary | ICD-10-CM | POA: Insufficient documentation

## 2018-06-29 DIAGNOSIS — Z0000 Encounter for general adult medical examination without abnormal findings: Secondary | ICD-10-CM

## 2018-06-29 DIAGNOSIS — Z79899 Other long term (current) drug therapy: Secondary | ICD-10-CM | POA: Diagnosis not present

## 2018-06-29 DIAGNOSIS — I10 Essential (primary) hypertension: Secondary | ICD-10-CM | POA: Diagnosis not present

## 2018-06-29 DIAGNOSIS — R4182 Altered mental status, unspecified: Secondary | ICD-10-CM | POA: Diagnosis present

## 2018-06-29 LAB — URINALYSIS, COMPLETE (UACMP) WITH MICROSCOPIC
Bacteria, UA: NONE SEEN
Bilirubin Urine: NEGATIVE
Glucose, UA: NEGATIVE mg/dL
Hgb urine dipstick: NEGATIVE
Ketones, ur: NEGATIVE mg/dL
Leukocytes,Ua: NEGATIVE
Nitrite: NEGATIVE
Protein, ur: NEGATIVE mg/dL
Specific Gravity, Urine: 1.003 — ABNORMAL LOW (ref 1.005–1.030)
pH: 8 (ref 5.0–8.0)

## 2018-06-29 LAB — POCT PREGNANCY, URINE: Preg Test, Ur: NEGATIVE

## 2018-06-29 LAB — COMPREHENSIVE METABOLIC PANEL
ALT: 43 U/L (ref 0–44)
AST: 101 U/L — ABNORMAL HIGH (ref 15–41)
Albumin: 4.8 g/dL (ref 3.5–5.0)
Alkaline Phosphatase: 78 U/L (ref 38–126)
Anion gap: 11 (ref 5–15)
BUN: 8 mg/dL (ref 6–20)
CO2: 26 mmol/L (ref 22–32)
Calcium: 9.1 mg/dL (ref 8.9–10.3)
Chloride: 101 mmol/L (ref 98–111)
Creatinine, Ser: 0.75 mg/dL (ref 0.44–1.00)
GFR calc Af Amer: >60 mL/min (ref >60)
GFR calc non Af Amer: >60 mL/min (ref >60)
Glucose, Bld: 102 mg/dL — ABNORMAL HIGH (ref 70–99)
Potassium: 3.7 mmol/L (ref 3.5–5.1)
Sodium: 138 mmol/L (ref 135–145)
Total Bilirubin: 0.4 mg/dL (ref 0.3–1.2)
Total Protein: 7.4 g/dL (ref 6.5–8.1)

## 2018-06-29 LAB — CBC
HCT: 37.4 % (ref 36.0–46.0)
Hemoglobin: 12.8 g/dL (ref 12.0–15.0)
MCH: 30.2 pg (ref 26.0–34.0)
MCHC: 34.2 g/dL (ref 30.0–36.0)
MCV: 88.2 fL (ref 80.0–100.0)
Platelets: 311 10*3/uL (ref 150–400)
RBC: 4.24 MIL/uL (ref 3.87–5.11)
RDW: 11.8 % (ref 11.5–15.5)
WBC: 5.4 10*3/uL (ref 4.0–10.5)
nRBC: 0 % (ref 0.0–0.2)

## 2018-06-29 LAB — GLUCOSE, CAPILLARY: Glucose-Capillary: 99 mg/dL (ref 70–99)

## 2018-06-29 NOTE — ED Provider Notes (Signed)
[facility] Emergency Department Provider Note  Time seen: 9:44 PM  I have reviewed the triage vital signs and the nursing notes.   HISTORY  Chief Complaint Altered Mental Status   HPI Joanne Johnston is a 48 y.o. female with a past medical history of alcohol use, anxiety, dementia, hypertension, presents to the emergency department after running away from her group home.  According to report patient had run away from her group home earlier today, was found by police and brought to the emergency department for evaluation.  Here the patient is awake and alert, she has no acute concerns.  Patient has a history of dementia/short-term memory loss which does affect her ability to contribute to her history.  Patient has a largely negative review of systems.   When asked why she is here she states "because the police brought me here."  Patient does not have any medical concerns at this time per patient.  Past Medical History:  Diagnosis Date  . Alcohol dependence (HCC)   . Anxiety   . Dementia (HCC)   . Depression   . Hypertension   . Tachycardia     Patient Active Problem List   Diagnosis Date Noted  . Mood disorder due to a general medical condition 12/16/2017  . Korsakoff syndrome (HCC) 12/04/2017  . Unspecified mood (affective) disorder (HCC) 11/18/2017  . Agitation 11/04/2017    Past Surgical History:  Procedure Laterality Date  . CESAREAN SECTION     x2    Prior to Admission medications   Medication Sig Start Date End Date Taking? Authorizing Provider  carbamazepine (TEGRETOL) 200 MG tablet Take 2 tablets (400 mg total) by mouth 2 (two) times daily. 12/30/17   McNew, Ileene Hutchinson, MD  QUEtiapine (SEROQUEL) 400 MG tablet Take 1 tablet (400 mg total) by mouth at bedtime. 12/30/17   McNew, Ileene Hutchinson, MD  thiamine 100 MG tablet Take 1 tablet (100 mg total) by mouth daily. 01/02/18   McNew, Ileene Hutchinson, MD    No Known Allergies  No family history on file.  Social  History Social History   Tobacco Use  . Smoking status: Never Smoker  . Smokeless tobacco: Never Used  Substance Use Topics  . Alcohol use: Not Currently    Frequency: Never    Comment: states hasn't had a drink in months  . Drug use: Never    Review of Systems Constitutional: Negative for fever Cardiovascular: Negative for chest pain. Respiratory: Negative for shortness of breath. Gastrointestinal: Negative for abdominal pain Musculoskeletal: Negative for musculoskeletal complaints Skin: Negative for skin complaints  Neurological: Negative for headache All other ROS negative, possibly somewhat limited by the patient short-term memory loss.  ____________________________________________   PHYSICAL EXAM:  VITAL SIGNS: ED Triage Vitals [06/29/18 2111]  Enc Vitals Group     BP (!) 125/91     Pulse Rate 81     Resp 16     Temp 98.2 F (36.8 C)     Temp Source Oral     SpO2 95 %     Weight 160 lb (72.6 kg)     Height 5\' 8"  (1.727 m)     Head Circumference      Peak Flow      Pain Score 0     Pain Loc      Pain Edu?      Excl. in GC?    Constitutional: Awake and alert, calm and cooperative, no distress. Eyes: Normal exam ENT  Head: Normocephalic and atraumatic.      Mouth/Throat: Mucous membranes are moist. Cardiovascular: Normal rate, regular rhythm Respiratory: Normal respiratory effort without tachypnea nor retractions. Breath sounds are clear  Gastrointestinal: Soft and nontender. No distention Musculoskeletal: Nontender with normal range of motion in all extremities.  Neurologic:  Normal speech and language. No gross focal neurologic deficits  Skin:  Skin is warm, dry and intact.  Psychiatric: Mood and affect are normal ____________________________________________   INITIAL IMPRESSION / ASSESSMENT AND PLAN / ED COURSE  Pertinent labs & imaging results that were available during my care of the patient were reviewed by me and considered in my medical  decision making (see chart for details).   Patient presents to the emergency department after running away from her group home.  Overall the patient appears well, no concerning findings on physical exam.  Patient's lab work performed in triage is reassuring as well.  Group home has been contacted and they will come pick the patient up.  I believe the patient is safe for discharge home.  Joanne Johnston was evaluated in Emergency Department on 06/29/2018 for the symptoms described in the history of present illness. She was evaluated in the context of the global COVID-19 pandemic, which necessitated consideration that the patient might be at risk for infection with the SARS-CoV-2 virus that causes COVID-19. Institutional protocols and algorithms that pertain to the evaluation of patients at risk for COVID-19 are in a state of rapid change based on information released by regulatory bodies including the CDC and federal and state organizations. These policies and algorithms were followed during the patient's care in the ED.  ____________________________________________   FINAL CLINICAL IMPRESSION(S) / ED DIAGNOSES  Medical evaluation   Joanne Johnston, Joanne Uriegas, MD 06/29/18 2217

## 2018-06-29 NOTE — ED Triage Notes (Signed)
Patient reports increased confusion and memory problems over the last few weeks. Patient reports history of similar episodes but states she doesn't feel like she is ever not confused. Denies any other complaints at this time.

## 2018-07-01 ENCOUNTER — Other Ambulatory Visit: Payer: Self-pay

## 2018-07-01 ENCOUNTER — Ambulatory Visit
Admission: RE | Admit: 2018-07-01 | Discharge: 2018-07-01 | Disposition: A | Discharge status: Home / Self Care | Payer: Medicaid Other | Source: Ambulatory Visit | Attending: Neurology | Admitting: Neurology

## 2018-07-01 ENCOUNTER — Ambulatory Visit: Payer: Medicaid Other

## 2018-07-01 DIAGNOSIS — R4189 Other symptoms and signs involving cognitive functions and awareness: Secondary | ICD-10-CM | POA: Insufficient documentation

## 2018-07-01 LAB — POCT I-STAT CREATININE: Creatinine, Ser: 0.8 mg/dL (ref 0.44–1.00)

## 2018-07-01 MED ORDER — GADOBUTROL 1 MMOL/ML IV SOLN
7.0000 mL | Freq: Once | INTRAVENOUS | Status: AC | PRN
  Administered 2018-07-01: 7 mL via INTRAVENOUS

## 2019-07-22 ENCOUNTER — Other Ambulatory Visit: Payer: Self-pay

## 2019-07-23 ENCOUNTER — Other Ambulatory Visit: Payer: Self-pay

## 2020-03-31 IMAGING — MR MRI HEAD WITHOUT AND WITH CONTRAST
14 series · 47 of 48 positions shown · IV contrast (gadavist)
Comparison: None.

CLINICAL DATA: Cognitive impairment in a patient with a history of
alcoholism. Poor historian with memory issues.

EXAM:
MRI HEAD WITHOUT AND WITH CONTRAST
TECHNIQUE: Multiplanar, multiecho pulse sequences of the brain and surrounding
structures were obtained without and with intravenous contrast.
CONTRAST:  Gadavist 7 mL.

[Series 5: T1 · sagittal · 5.0mm · 0.62mm/px · 2 of 25 slices shown (1 of 2)]
[im 1/25]
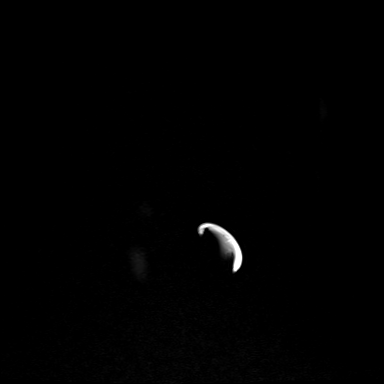
[im 25/25]
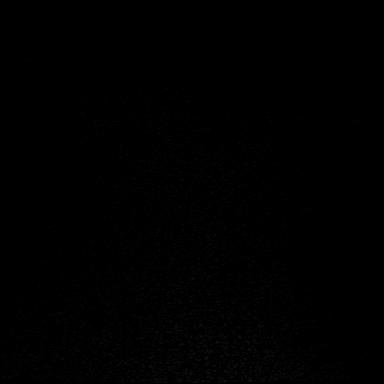

[Series 6: ax dwi_tracew · axial · 3.0mm · 0.60mm/px · z∈[-12,+148]mm · 3 of 55 slices shown]
[im 1/55]
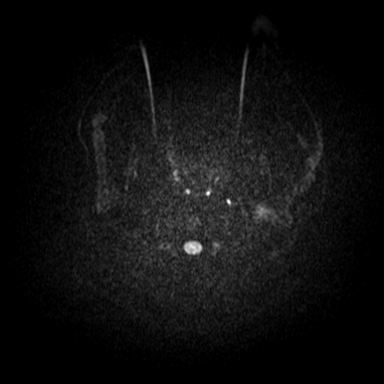
[im 28/55]
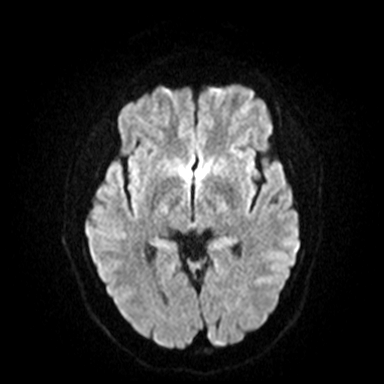
[im 55/55]
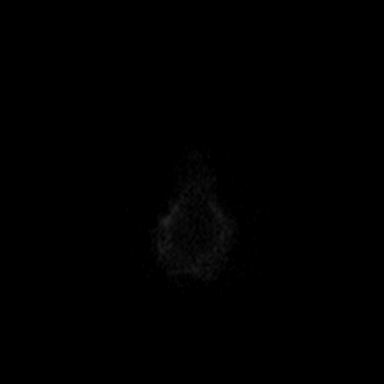

[Series 7: ax dwi_adc · axial · 3.0mm · 0.60mm/px · z∈[-12,+148]mm · 3 of 55 slices shown]
[im 1/55]
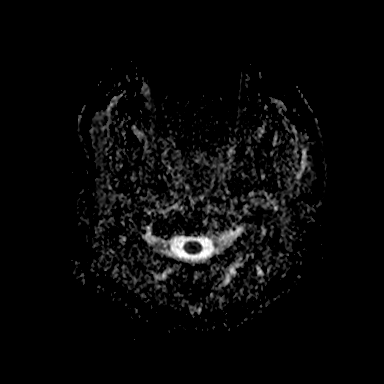
[im 28/55]
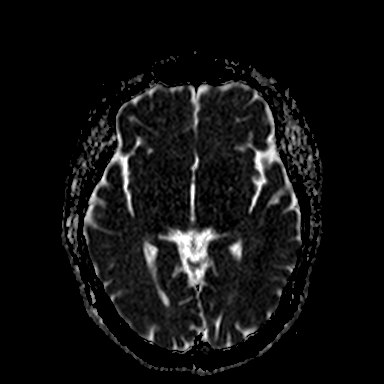
[im 55/55]
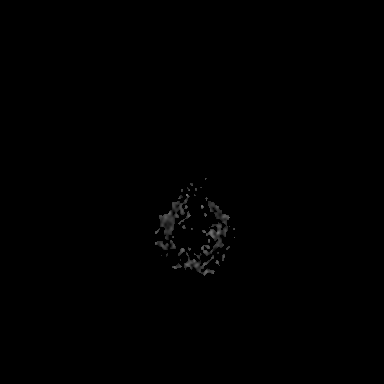

[Series 8: cor dwi_tracew · coronal · 5.0mm · 0.60mm/px · 2 of 39 slices shown]
[im 1/39]
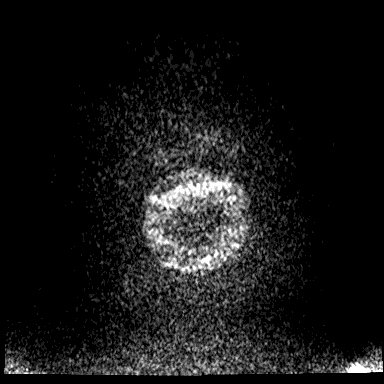
[im 39/39]
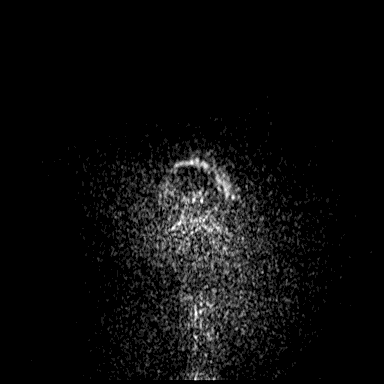

[Series 9: cor dwi_adc · coronal · 5.0mm · 0.60mm/px · 2 of 39 slices shown]
[im 1/39]
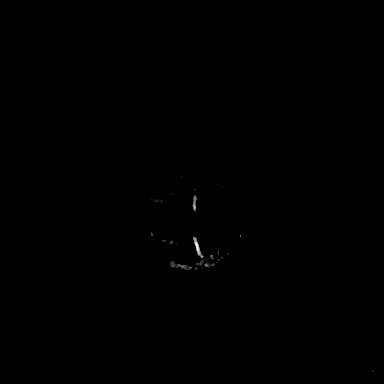
[im 39/39]
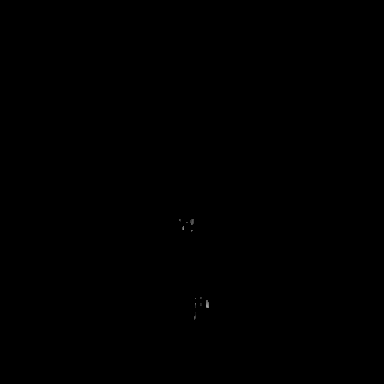

[Series 10: T2 · axial · 5.0mm · 0.53mm/px · 1 of 27 slices shown (1 of 2)]
[im 1/27]
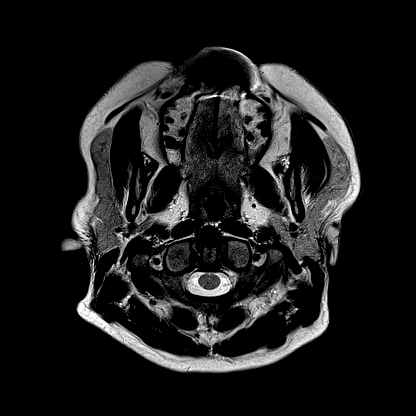

[Series 11: mag_images · axial · 3.0mm · 0.90mm/px · z∈[-12,+161]mm · 3 of 60 slices shown]
[im 1/60]
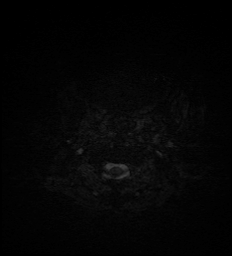
[im 30/60]
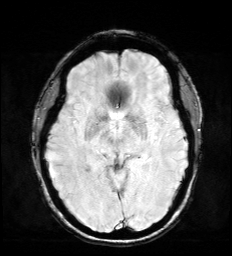
[im 60/60]
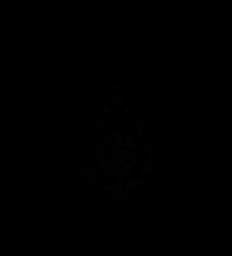

[Series 12: pha_images · axial · 3.0mm · 0.90mm/px · z∈[-12,+161]mm · 3 of 59 slices shown]
[im 1/59]
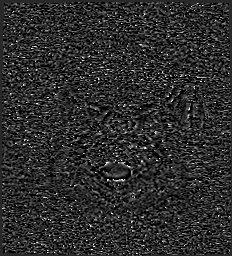
[im 30/59]
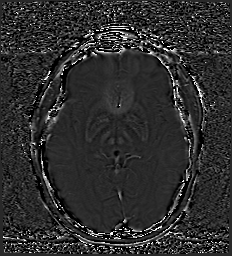
[im 59/59]
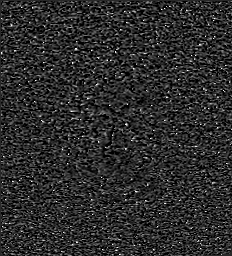

[Series 13: swi_images · axial · 3.0mm · 0.90mm/px · z∈[-12,+161]mm · 3 of 60 slices shown]
[im 1/60]
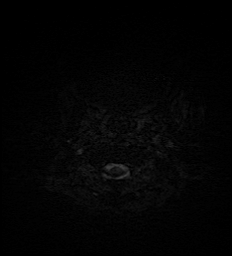
[im 30/60]
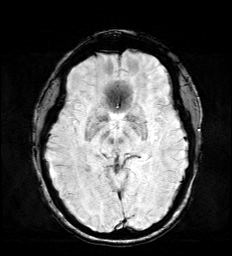
[im 60/60]
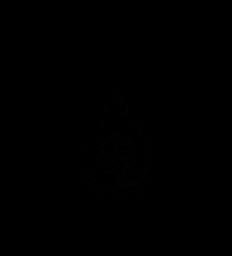

[Series 15: FLAIR · axial · 3.0mm · 0.53mm/px · z∈[-5,+153]mm · 3 of 55 slices shown]
[im 1/55]
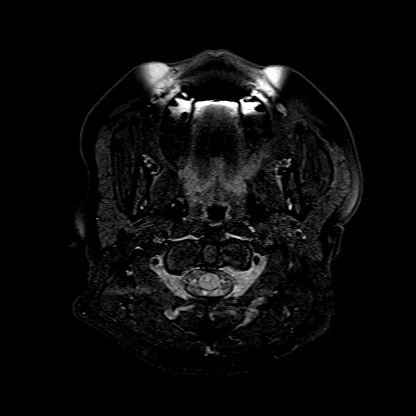
[im 28/55]
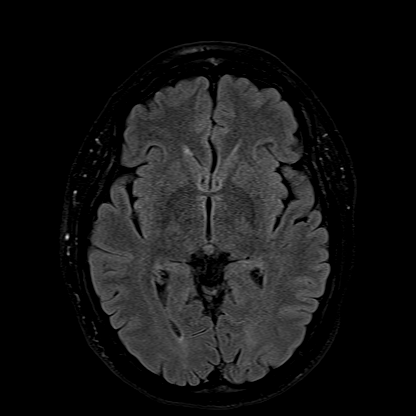
[im 55/55]
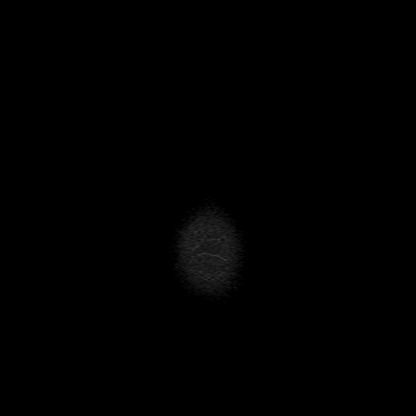

[Series 16: T1 · axial · 1.0mm · 0.98mm/px · z∈[-7,+164]mm · 8 of 174 slices shown (2 of 2)]
[im 1/174]
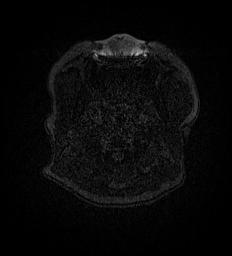
[im 22/174]
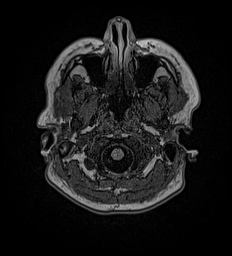
[im 44/174]
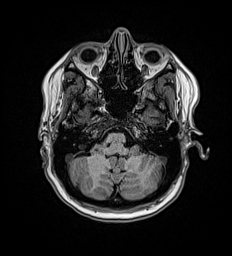
[im 65/174]
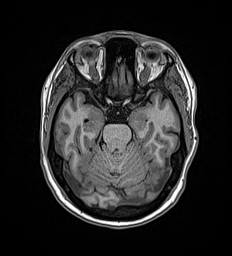
[im 109/174]
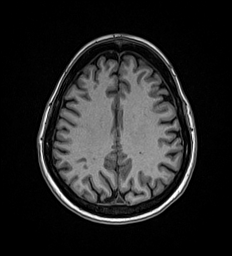
[im 130/174]
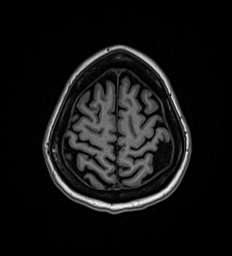
[im 152/174]
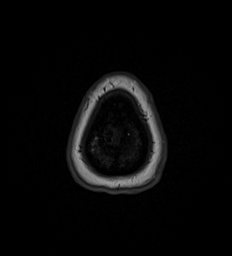
[im 174/174]
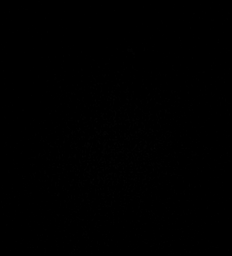

[Series 17: T2 · coronal · 5.0mm · 0.45mm/px · 2 of 31 slices shown (2 of 2)]
[im 1/31]
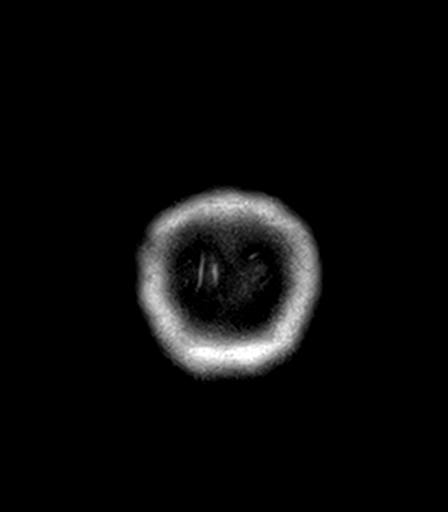
[im 31/31]
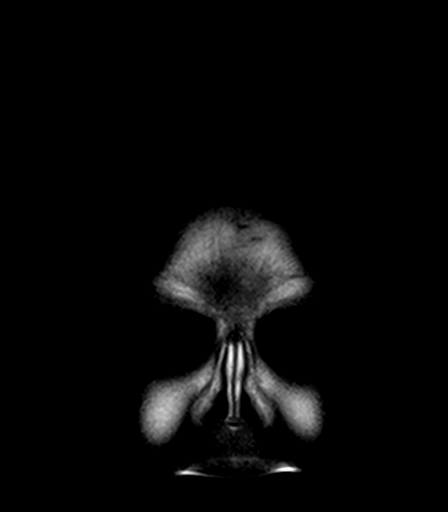

[Series 18: T1 post-contrast · axial · 1.0mm · 0.98mm/px · z∈[-7,+164]mm · 10 of 176 slices shown (1 of 2)]
[im 1/176]
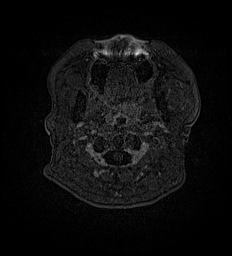
[im 20/176]
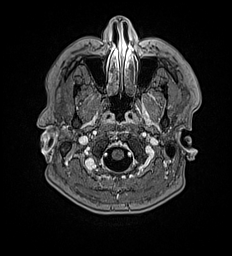
[im 39/176]
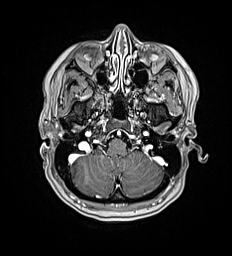
[im 59/176]
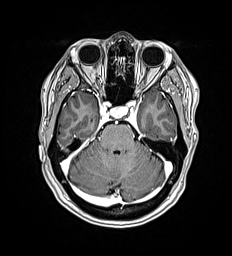
[im 78/176]
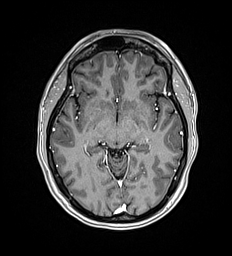
[im 98/176]
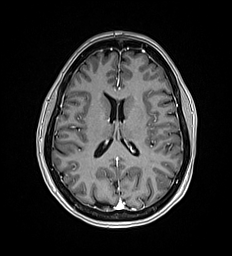
[im 117/176]
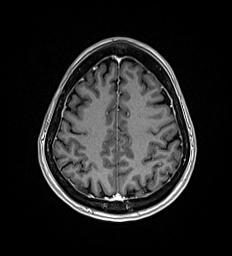
[im 137/176]
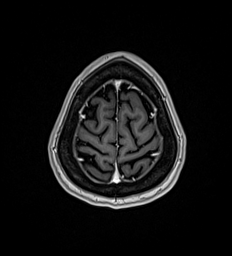
[im 156/176]
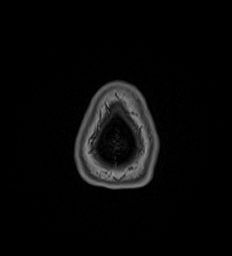
[im 176/176]
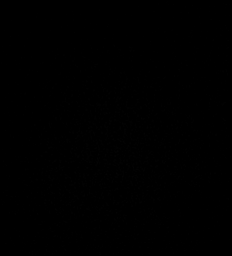

[Series 19: T1 post-contrast · coronal · 5.0mm · 0.90mm/px · 2 of 31 slices shown (2 of 2)]
[im 1/31]
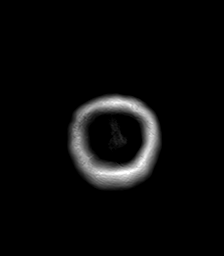
[im 31/31]
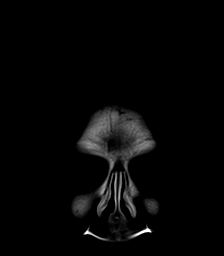

[47 of 48 positions shown; findings below may reference images not displayed]

FINDINGS: Brain: No acute infarction, hemorrhage, hydrocephalus, extra-axial
collection or mass lesion. Mild prominence of the ventricles,
cisterns, and sulci, likely premature for age atrophy. No focal
areas of brain substance loss. Minor subcortical and periventricular
T2 and FLAIR hyperintensities, likely chronic microvascular ischemic
change.

Post infusion, no abnormal enhancement of the brain or meninges.

Vascular: Flow voids are maintained throughout the carotid, basilar,
and vertebral arteries. There are no areas of chronic hemorrhage.

Skull and upper cervical spine: Unremarkable visualized calvarium,
skullbase, and cervical vertebrae. Pituitary, pineal, cerebellar
tonsils unremarkable. No upper cervical cord lesions.

Sinuses/Orbits: No orbital masses or proptosis. Globes appear
symmetric. Sinuses appear well aerated, without evidence for
air-fluid level.

Other: No nasopharyngeal pathology or mastoid fluid. Scalp and other
visualized extracranial soft tissues grossly unremarkable.
IMPRESSION: Mild premature for age atrophy.  Minor white matter disease.

No acute intracranial findings. No abnormal postcontrast
enhancement.
